# Patient Record
Sex: Female | Born: 1970 | Race: Black or African American | Hispanic: No | Marital: Married | Smoking: Never smoker
Health system: Southern US, Community
[De-identification: ages and names within clinical notes are randomized; demographics above are authoritative.]

## PROBLEM LIST (undated history)

## (undated) DIAGNOSIS — M940 Chondrocostal junction syndrome [Tietze]: Secondary | ICD-10-CM

## (undated) DIAGNOSIS — G43909 Migraine, unspecified, not intractable, without status migrainosus: Secondary | ICD-10-CM

## (undated) DIAGNOSIS — E7800 Pure hypercholesterolemia, unspecified: Secondary | ICD-10-CM

## (undated) DIAGNOSIS — E0590 Thyrotoxicosis, unspecified without thyrotoxic crisis or storm: Secondary | ICD-10-CM

## (undated) DIAGNOSIS — K219 Gastro-esophageal reflux disease without esophagitis: Secondary | ICD-10-CM

## (undated) HISTORY — DX: Migraine, unspecified, not intractable, without status migrainosus: G43.909

## (undated) HISTORY — DX: Gastro-esophageal reflux disease without esophagitis: K21.9

## (undated) HISTORY — DX: Chondrocostal junction syndrome (tietze): M94.0

---

## 2012-08-03 HISTORY — PX: COLONOSCOPY: SHX174

## 2013-04-26 DIAGNOSIS — E559 Vitamin D deficiency, unspecified: Secondary | ICD-10-CM | POA: Insufficient documentation

## 2013-12-26 ENCOUNTER — Encounter: Payer: Self-pay | Admitting: Endocrinology

## 2013-12-26 ENCOUNTER — Ambulatory Visit (INDEPENDENT_AMBULATORY_CARE_PROVIDER_SITE_OTHER): Payer: BC Managed Care – PPO | Admitting: Endocrinology

## 2013-12-26 VITALS — BP 122/84 | HR 81 | Temp 98.6°F | Wt 157.0 lb

## 2013-12-26 DIAGNOSIS — E0590 Thyrotoxicosis, unspecified without thyrotoxic crisis or storm: Secondary | ICD-10-CM

## 2013-12-26 LAB — TSH: TSH: 7.34 u[IU]/mL — ABNORMAL HIGH (ref 0.35–4.50)

## 2013-12-26 LAB — T4, FREE: Free T4: 0.69 ng/dL (ref 0.60–1.60)

## 2013-12-26 NOTE — Patient Instructions (Addendum)
blood tests are being requested for you today.  We'll let you know about the results. if ever you have fever while taking methimazole, stop it and call us, because of the risk of a rare side-effect If you want, you can change your mind and do the radioactive iodine treatment in the future. Let's plan to recheck the ultrasound in 2016 or 2017.

## 2013-12-26 NOTE — Progress Notes (Signed)
Subjective:    Patient ID: Christina [name]. Christina [name], female    DOB: 1970/04/15, 43 y.o.   MRN: [id number]  HPI Pt reports she was dx'ed with hyperthyroidism in early 2015, in [place].  He has never had XRT to the anterior neck, or thyroid surgery.  she does not consume kelp or any non-prescribed thyroid medication.  He has never been on amiodarone.  Pt is unaware why tapazole was chosen as rx, but she takes it as rx'ed.  She had moderate palpitations in the chest, and assoc sob.  However, sxs are much better now.   No past medical history on file.  No past surgical history on file.  History   Social History  . Marital Status: Single    Spouse Name: N/A    Number of Children: N/A  . Years of Education: N/A   Occupational History  . Not on file.   Social History Main Topics  . Smoking status: Never Smoker   . Smokeless tobacco: Not on file  . Alcohol Use: No  . Drug Use: Not on file  . Sexual Activity: Not on file   Other Topics Concern  . Not on file   Social History Narrative  . No narrative on file    No current outpatient prescriptions on file prior to visit.   No current facility-administered medications on file prior to visit.    Not on File  Family History  Problem Relation Age of Onset  . Thyroid disease Neg Hx     BP 122/84 mmHg  Pulse 81  Temp(Src) 98.6 F (37 C) (Oral)  Wt 157 lb (71.215 kg)  SpO2 97%  LMP 12/10/2013     Review of Systems denies weight loss, headache, hoarseness, double vision, edema, diarrhea, polyuria, muscle weakness, excessive diaphoresis, numbness, tremor, anxiety, easy bruising, and rhinorrhea.  She has infrequent menses.    Objective:   Physical Exam VS: see vs page GEN: no distress HEAD: head: no deformity eyes: no periorbital swelling, no proptosis external nose and ears are normal mouth: no lesion seen NECK: thyroid is slightly and diffusely enlarged CHEST WALL: no deformity LUNGS:  Clear to auscultation CV: reg rate  and rhythm, no murmur ABD: abdomen is soft, nontender.  no hepatosplenomegaly.  not distended.  no hernia MUSCULOSKELETAL: muscle bulk and strength are grossly normal.  no obvious joint swelling.  gait is normal and steady EXTEMITIES: no deformity.  no edema PULSES: no carotid bruit NEURO:  cn 2-12 grossly intact.   readily moves all 4's.  sensation is intact to touch on all 4's.  No tremor SKIN:  Normal texture and temperature.  No rash or suspicious lesion is visible.  Not diaphoretic.  NODES:  None palpable at the neck PSYCH: alert, well-oriented.  Does not appear anxious nor depressed.    i have reviewed the following old records: Office notes: pt was eval for hyperthyroidism, and rx'ed with tapazole  Radiol: nuc med scan: diffuse uptake (45% at 24 hrs). Thyroid US diffuse goiter, with multiple very small nodules  Lab Results  Component Value Date   TSH 7.34* 12/26/2013       Assessment & Plan:  Hyperthyroidism, new to me: slightly overcontrolled: we discussed rx options.  Pt chooses to continue the tapazole for now.  i advised her to reduce to TIW.  Multinodular goiter, very small   Patient is advised the following: Patient Instructions  blood tests are being requested for you today.  We'll let you know  about the results. if ever you have fever while taking methimazole, stop it and call us, because of the risk of a rare side-effect If you want, you can change your mind and do the radioactive iodine treatment in the future. Let's plan to recheck the ultrasound in 2016 or 2017.

## 2013-12-27 ENCOUNTER — Telehealth: Payer: Self-pay | Admitting: Endocrinology

## 2013-12-27 NOTE — Telephone Encounter (Signed)
please call patient: We need to reduce your thyroid pill.  Please take 1 pill, just mon, wed, fri. Please come back for a follow-up appointment in 2-3 months.

## 2013-12-28 NOTE — Telephone Encounter (Signed)
Pt advised of note below and voiced understanding.  

## 2013-12-28 NOTE — Telephone Encounter (Signed)
Requested call back to discuss.  

## 2014-03-10 ENCOUNTER — Emergency Department (HOSPITAL_COMMUNITY): Payer: Federal, State, Local not specified - PPO

## 2014-03-10 ENCOUNTER — Emergency Department (HOSPITAL_COMMUNITY)
Admission: EM | Admit: 2014-03-10 | Discharge: 2014-03-11 | Disposition: A | Discharge status: Home / Self Care | Payer: Federal, State, Local not specified - PPO | Attending: Emergency Medicine | Admitting: Emergency Medicine

## 2014-03-10 ENCOUNTER — Encounter (HOSPITAL_COMMUNITY): Payer: Self-pay | Admitting: Emergency Medicine

## 2014-03-10 DIAGNOSIS — R531 Weakness: Secondary | ICD-10-CM | POA: Diagnosis not present

## 2014-03-10 DIAGNOSIS — R51 Headache: Secondary | ICD-10-CM | POA: Diagnosis not present

## 2014-03-10 DIAGNOSIS — H539 Unspecified visual disturbance: Secondary | ICD-10-CM | POA: Diagnosis not present

## 2014-03-10 DIAGNOSIS — R519 Headache, unspecified: Secondary | ICD-10-CM

## 2014-03-10 HISTORY — DX: Thyrotoxicosis, unspecified without thyrotoxic crisis or storm: E05.90

## 2014-03-10 LAB — I-STAT CREATININE, ED: CREATININE: 1.1 mg/dL (ref 0.50–1.10)

## 2014-03-10 LAB — CBC WITH DIFFERENTIAL/PLATELET
Basophils Absolute: 0 10*3/uL (ref 0.0–0.1)
Basophils Relative: 0 % (ref 0–1)
Eosinophils Absolute: 0 10*3/uL (ref 0.0–0.7)
Eosinophils Relative: 0 % (ref 0–5)
HCT: 40.8 % (ref 36.0–46.0)
Hemoglobin: 13.5 g/dL (ref 12.0–15.0)
LYMPHS PCT: 22 % (ref 12–46)
Lymphs Abs: 1.3 10*3/uL (ref 0.7–4.0)
MCH: 28.7 pg (ref 26.0–34.0)
MCHC: 33.1 g/dL (ref 30.0–36.0)
MCV: 86.6 fL (ref 78.0–100.0)
Monocytes Absolute: 0.5 10*3/uL (ref 0.1–1.0)
Monocytes Relative: 9 % (ref 3–12)
NEUTROS ABS: 3.9 10*3/uL (ref 1.7–7.7)
Neutrophils Relative %: 69 % (ref 43–77)
PLATELETS: 222 10*3/uL (ref 150–400)
RBC: 4.71 MIL/uL (ref 3.87–5.11)
RDW: 12.6 % (ref 11.5–15.5)
WBC: 5.7 10*3/uL (ref 4.0–10.5)

## 2014-03-10 LAB — BASIC METABOLIC PANEL
Anion gap: 2 — ABNORMAL LOW (ref 5–15)
BUN: 11 mg/dL (ref 6–23)
CO2: 26 mmol/L (ref 19–32)
CREATININE: 1.01 mg/dL (ref 0.50–1.10)
Calcium: 8.6 mg/dL (ref 8.4–10.5)
Chloride: 108 mmol/L (ref 96–112)
GFR calc non Af Amer: 67 mL/min — ABNORMAL LOW (ref >90)
GFR, EST AFRICAN AMERICAN: 77 mL/min — AB (ref >90)
Glucose, Bld: 107 mg/dL — ABNORMAL HIGH (ref 70–99)
POTASSIUM: 3.8 mmol/L (ref 3.5–5.1)
SODIUM: 136 mmol/L (ref 135–145)

## 2014-03-10 MED ORDER — METOCLOPRAMIDE HCL 5 MG/ML IJ SOLN
10.0000 mg | Freq: Once | INTRAMUSCULAR | Status: AC
  Administered 2014-03-10: 10 mg via INTRAVENOUS
  Filled 2014-03-10: qty 2

## 2014-03-10 MED ORDER — DIPHENHYDRAMINE HCL 50 MG/ML IJ SOLN
25.0000 mg | Freq: Once | INTRAMUSCULAR | Status: AC
  Administered 2014-03-10: 25 mg via INTRAVENOUS
  Filled 2014-03-10: qty 1

## 2014-03-10 MED ORDER — IOHEXOL 350 MG/ML SOLN
100.0000 mL | Freq: Once | INTRAVENOUS | Status: AC | PRN
  Administered 2014-03-10: 100 mL via INTRAVENOUS

## 2014-03-10 MED ORDER — KETOROLAC TROMETHAMINE 30 MG/ML IJ SOLN
30.0000 mg | Freq: Once | INTRAMUSCULAR | Status: AC
  Administered 2014-03-10: 30 mg via INTRAVENOUS
  Filled 2014-03-10: qty 1

## 2014-03-10 NOTE — ED Notes (Signed)
Pt states today around 145 pm she started having problems with her vision where her vision in her left eye went blurred then she states she could not see at all from that eye  Pt states she had weakness in her right arm  Pt states she could raise her arm but her coordination was not good in it    Pt states those symptoms have resolved but now she is c/o headache on the left side that goes down into her neck  Pt states she was in [place] at the time and went to a local treatment center but the wait was 2 hours and so she came back home and then came here  Pt has no neuro deficits noted upon arrival

## 2014-03-10 NOTE — ED Notes (Signed)
Per Dr. Jeneen Rinks, patient to be transferred to [facility] Patient and family members are aware of plan of care--agree and v/u

## 2014-03-10 NOTE — ED Notes (Signed)
CT results noted Patient remains alert and oriented x 4 Patient denies c/o pain, return of vision loss, upper extremity pain and numbness Cardiac monitor with cycling BP continues  Patient in NAD

## 2014-03-10 NOTE — ED Notes (Signed)
Patient ambulatory from triage Patient states that at 1330 she experienced left eye "blindness" Patient states that at 1730 she could "see better out of the left eye, but it was blotches" Patient states that at 1330 she began to experience right hand and arm numbness which is still present Patient then reports that she started to have a headache at 1600 and the pain radiated to her left jaw Patient with c/o headache which she rates 5/10 Patient arrives alert and oriented x 4

## 2014-03-10 NOTE — ED Notes (Signed)
Patient back from CT scan Patient remains in NAD

## 2014-03-10 NOTE — ED Notes (Signed)
Per Dr. Jeneen Rinks, patient to be transferred to ED for MRI [facility] ED Charge Nurse called and made aware of patient transfer

## 2014-03-10 NOTE — ED Notes (Signed)
Neuro assessment completed Neuro WNL

## 2014-03-10 NOTE — ED Provider Notes (Addendum)
CSN: [id number]     Arrival date & time 03/10/14  2035 History   First MD Initiated Contact with Patient 03/10/14 2045     Chief Complaint  Patient presents with  . Headache      HPI  Patient presents for evaluation of headache, vision changes, and right arm symptoms.  She was visiting her family in [place]. She was this morning. Proximal 1 PM she was cleaning a toilet. She leaned over. When she stood up she states that she noticed the vision in her left eye seems poor. She has difficulty describing if this was one side of her visual field that she states she "thinks" that was her "entire left eye". Over the next half an hour she states her right arm felt clumsy and she had difficulty lifting it. Oxalate 4:00 they had gone to a local healthcare facility that had a long wait. Her family decided to drive her back to [place] where she lives. En route her symptoms have all resolved. However she developed a mild/moderate left-sided throbbing headache. Left frontal periorbital to the mandible into the left anterior neck. No fever. No falls or injuries or trauma. No past similar episodes.  Denies illicit drug use. No history of smoking. No history of cardiovascular disease. States occasionally will feel as "skipped beat" but no persistence of tachycardia palpitations and no symptoms recently. This is infrequently experience. No history of hypertension diabetes. She had an aunt with a stroke. No known family history of aneurysms or subarachnoid hemorrhages. She has never had a headache that she considered a migraine.  Past Medical History  Diagnosis Date  . Hyperthyroidism    History reviewed. No pertinent past surgical history. Family History  Problem Relation Age of Onset  . Thyroid disease Neg Hx   . Diabetes Mother   . Hypertension Mother    History  Substance Use Topics  . Smoking status: Never Smoker   . Smokeless tobacco: Not on file  . Alcohol Use: No   OB History    No data  available     Review of Systems  Constitutional: Negative for fever, chills, diaphoresis, appetite change and fatigue.  HENT: Negative for mouth sores, sore throat and trouble swallowing.   Eyes: Positive for visual disturbance.  Respiratory: Negative for cough, chest tightness, shortness of breath and wheezing.   Cardiovascular: Negative for chest pain.  Gastrointestinal: Negative for nausea, vomiting, abdominal pain, diarrhea and abdominal distention.  Endocrine: Negative for polydipsia, polyphagia and polyuria.  Genitourinary: Negative for dysuria, frequency and hematuria.  Musculoskeletal: Negative for gait problem.  Skin: Negative for color change, pallor and rash.  Neurological: Positive for weakness and headaches. Negative for dizziness, syncope and light-headedness.  Hematological: Does not bruise/bleed easily.  Psychiatric/Behavioral: Negative for behavioral problems and confusion.      Allergies  Review of patient's allergies indicates no known allergies.  Home Medications   Prior to Admission medications   Medication Sig Start Date End Date Taking? Authorizing Provider  Biotin w/ Vitamins C & E 1250-7.5-7.5 MCG-MG-UNT CHEW Chew 2 tablets by mouth daily.   Yes Historical Provider, MD  cholecalciferol (VITAMIN D) 1000 UNITS tablet Take 1,000 Units by mouth daily.   Yes Historical Provider, MD  ibuprofen (ADVIL,MOTRIN) 200 MG tablet Take 400 mg by mouth every 6 (six) hours as needed for moderate pain.   Yes Historical Provider, MD  methimazole (TAPAZOLE) 5 MG tablet Take 5 mg by mouth every Monday, Wednesday, and Friday.    Yes  Historical Provider, MD   BP 108/76 mmHg  Pulse 73  Temp(Src) 98.1 F (36.7 C) (Oral)  Resp 14  Ht 5\' 7"  (1.702 m)  Wt 150 lb (68.04 kg)  BMI 23.49 kg/m2  SpO2 97%  LMP 03/03/2014 (Exact Date) Physical Exam  Constitutional: She is oriented to person, place, and time. She appears well-developed and well-nourished. No distress.  HENT:  Head:  Normocephalic.  Eyes: Conjunctivae are normal. Pupils are equal, round, and reactive to light. No scleral icterus.  Neck: Normal range of motion. Neck supple. No thyromegaly present.  Cardiovascular: Normal rate and regular rhythm.  Exam reveals no gallop and no friction rub.   No murmur heard. Pulmonary/Chest: Effort normal and breath sounds normal. No respiratory distress. She has no wheezes. She has no rales.  Abdominal: Soft. Bowel sounds are normal. She exhibits no distension. There is no tenderness. There is no rebound.  Musculoskeletal: Normal range of motion.  Neurological: She is alert and oriented to person, place, and time.  Cranial nerves intact including visual fields to confrontation. No pronator drift. Normal strength and sensation all distributions of the 4 extremities. Normal cerebellar function. Normal DTRs.  Skin: Skin is warm and dry. No rash noted.  Psychiatric: She has a normal mood and affect. Her behavior is normal.    ED Course  Procedures (including critical care time) Labs Review Labs Reviewed  BASIC METABOLIC PANEL - Abnormal; Notable for the following:    Glucose, Bld 107 (*)    GFR calc non Af Amer 67 (*)    GFR calc Af Amer 77 (*)    Anion gap 2 (*)    All other components within normal limits  CBC WITH DIFFERENTIAL/PLATELET  I-STAT CREATININE, ED    Imaging Review No results found.   EKG Interpretation   Date/Time:  Sunday March 10 2014 20:49:52 EST Ventricular Rate:  80 PR Interval:  149 QRS Duration: 79 QT Interval:  380 QTC Calculation: 438 R Axis:   58 Text Interpretation:  Sinus rhythm Baseline wander in lead(s) V5 V6 ED  PHYSICIAN INTERPRETATION AVAILABLE IN [facility] Confirmed by TEST,  Record ([id number]) on 03/12/2014 6:51:39 AM      MDM   Final diagnoses:  Headache    Patient is no risk for subarachnoid hemorrhage. Her with the pain except into her neck and requested a CT head, and cervical/ intracranial  angiogram.   Discussed with Dr. Doy Mince of neurology. She felt that with patient's symptoms she will need MRI to completely rule out CVA. Did not think that the abdomen noted on angiogram was etiology for her symptoms. I discussed with Dr.Rees, the partner at [facility]. Patient will be transferred for MRI. Tanna Furry, MD 03/10/14 2223  Tanna Furry, MD 03/10/14 9528  Tanna Furry, MD 03/13/14 [phone]

## 2014-03-10 NOTE — ED Notes (Signed)
Patient states that she does not want [facility] called until her husband comes back to ED from the gas station  Will inform EDP and ED Charge

## 2014-03-10 NOTE — ED Notes (Signed)
Patient now reports that vision is normal Peripheral vision WNL PERRLA

## 2014-03-11 ENCOUNTER — Emergency Department (HOSPITAL_COMMUNITY): Payer: Federal, State, Local not specified - PPO

## 2014-03-11 MED ORDER — ONDANSETRON HCL 4 MG/2ML IJ SOLN
4.0000 mg | Freq: Once | INTRAMUSCULAR | Status: AC
  Administered 2014-03-11: 4 mg via INTRAVENOUS
  Filled 2014-03-11: qty 2

## 2014-03-11 MED ORDER — LORAZEPAM 2 MG/ML IJ SOLN
1.0000 mg | Freq: Once | INTRAMUSCULAR | Status: AC
  Administered 2014-03-11: 1 mg via INTRAVENOUS
  Filled 2014-03-11: qty 1

## 2014-03-11 MED ORDER — MORPHINE SULFATE 4 MG/ML IJ SOLN
4.0000 mg | INTRAMUSCULAR | Status: DC | PRN
  Administered 2014-03-11: 4 mg via INTRAVENOUS
  Filled 2014-03-11: qty 1

## 2014-03-11 NOTE — ED Notes (Signed)
Patient informed that her husband and son will not be able to ride in ambulance over to [facility] ED and that they will have to drive themselves [facility] called and ETA approximately 30 minutes Patient informed

## 2014-03-11 NOTE — ED Notes (Signed)
Patient transferred to [facility] ED  Patient in NAD upon leaving ED

## 2014-03-11 NOTE — ED Notes (Signed)
Pt returned from MRI °

## 2014-03-11 NOTE — ED Notes (Signed)
Report given to Billy with [facility] 

## 2014-03-11 NOTE — Discharge Instructions (Signed)
Start taking an Aspirin daily (324 mg enteric coated).  Your MRI was normal.  Your CT of your neck shows narrowing of your carotid artery that needs to be followed up by your family doctor.     General Headache Without Cause A headache is pain or discomfort felt around the head or neck area. The specific cause of a headache may not be found. There are many causes and types of headaches. A few common ones are:  Tension headaches.  Migraine headaches.  Cluster headaches.  Chronic daily headaches. HOME CARE INSTRUCTIONS   Keep all follow-up appointments with your caregiver or any specialist referral.  Only take over-the-counter or prescription medicines for pain or discomfort as directed by your caregiver.  Lie down in a dark, quiet room when you have a headache.  Keep a headache journal to find out what may trigger your migraine headaches. For example, write down:  What you eat and drink.  How much sleep you get.  Any change to your diet or medicines.  Try massage or other relaxation techniques.  Put ice packs or heat on the head and neck. Use these 3 to 4 times per day for 15 to 20 minutes each time, or as needed.  Limit stress.  Sit up straight, and do not tense your muscles.  Quit smoking if you smoke.  Limit alcohol use.  Decrease the amount of caffeine you drink, or stop drinking caffeine.  Eat and sleep on a regular schedule.  Get 7 to 9 hours of sleep, or as recommended by your caregiver.  Keep lights dim if bright lights bother you and make your headaches worse. SEEK MEDICAL CARE IF:   You have problems with the medicines you were prescribed.  Your medicines are not working.  You have a change from the usual headache.  You have nausea or vomiting. SEEK IMMEDIATE MEDICAL CARE IF:   Your headache becomes severe.  You have a fever.  You have a stiff neck.  You have loss of vision.  You have muscular weakness or loss of muscle control.  You start  losing your balance or have trouble walking.  You feel faint or pass out.  You have severe symptoms that are different from your first symptoms. MAKE SURE YOU:   Understand these instructions.  Will watch your condition.  Will get help right away if you are not doing well or get worse. Document Released: 01/11/2005 Document Revised: 04/05/2011 Document Reviewed: 01/27/2011 [facility] Patient Information 2015 [place], [place]. This information is not intended to replace advice given to you by your health care provider. Make sure you discuss any questions you have with your health care provider.

## 2014-03-11 NOTE — ED Notes (Signed)
[facility] present in ED

## 2014-03-11 NOTE — ED Notes (Signed)
Report received by [facility]- care resumed by this RN. nad upon arrival. Pt denies pain at this time.

## 2014-03-11 NOTE — ED Notes (Signed)
Neuro assessment completed prior to transfer to [facility] ED

## 2014-03-11 NOTE — ED Notes (Signed)
Patient now with c/o headache returning Will make Dr. Jeneen Rinks aware

## 2014-03-11 NOTE — ED Provider Notes (Signed)
Patient visit shared. Patient transferred from [facility] for evaluation of headache with MRI to rule out stroke.   On evaluation in the emergency department patient is neurologically intact and states her headache is resolved. MRI has been reviewed and the report is with no evidence of acute stroke. Plan to DC home with outpatient follow-up.  Quintella Reichert, MD 03/11/14 [phone]

## 2014-03-11 NOTE — ED Notes (Signed)
Pt transported to MRI 

## 2014-03-18 ENCOUNTER — Emergency Department (HOSPITAL_COMMUNITY)
Admission: EM | Admit: 2014-03-18 | Discharge: 2014-03-18 | Disposition: A | Discharge status: Home / Self Care | Payer: Federal, State, Local not specified - PPO | Source: Home / Self Care | Attending: Emergency Medicine | Admitting: Emergency Medicine

## 2014-03-18 ENCOUNTER — Encounter (HOSPITAL_COMMUNITY): Payer: Self-pay

## 2014-03-18 DIAGNOSIS — G43101 Migraine with aura, not intractable, with status migrainosus: Secondary | ICD-10-CM

## 2014-03-18 MED ORDER — SUMATRIPTAN SUCCINATE 6 MG/0.5ML ~~LOC~~ SOLN
SUBCUTANEOUS | Status: AC
  Filled 2014-03-18: qty 0.5

## 2014-03-18 MED ORDER — SUMATRIPTAN SUCCINATE 6 MG/0.5ML ~~LOC~~ SOLN
6.0000 mg | Freq: Once | SUBCUTANEOUS | Status: AC
  Administered 2014-03-18: 6 mg via SUBCUTANEOUS

## 2014-03-18 MED ORDER — SUMATRIPTAN SUCCINATE 100 MG PO TABS: ORAL_TABLET | ORAL | Status: DC

## 2014-03-18 NOTE — ED Notes (Signed)
Patient c/o HA since 2-14

## 2014-03-18 NOTE — ED Provider Notes (Signed)
CSN: [id number]     Arrival date & time 03/18/14  1125 History   First MD Initiated Contact with Patient 03/18/14 1327     Chief Complaint  Patient presents with  . Headache   (Consider location/radiation/quality/duration/timing/severity/associated sxs/prior Treatment) HPI Comments: Patient developed headache with associated visual disturbance and right arm weakness began on 03/10/2014. Seen and treated at both [facility] and [facility]. States headache has continued to wax and wane along with associated left visual field scotoma and aura. Has tried taking ibuprofen and Goody's headache powders with some relief. Is concerned that headache has not resolved completely.  Works for [employer] NO recent head trauma, fever or neck pain. Mild associated photophobia, but no nausea/vomiting.   Head CT 03/10/2014: IMPRESSION: Normal CT of the head with and without contrast.  Normal CTA of the head ; no intracranial aneurysm.  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 Findings suggest remote dissection, less likely fibromuscular dysplasia. Recommend followup.  Considering symptoms, MRI of the brain with diffusion-weighted sequences may be indicated to evaluate for stroke.   Brain MRI 03/11/2014:  MRI HEAD WITHOUT CONTRAST  TECHNIQUE: Multiplanar, multiecho pulse sequences of the brain and surrounding structures were obtained without intravenous contrast.  COMPARISON: CT angiogram of the head March 10, 2014  FINDINGS: The ventricles and sulci are normal. No abnormal parenchymal signal, mass lesions or mass of affect. No reduced diffusion to suggest acute ischemia. No susceptibility artifact to suggest hemorrhage.  No abnormal extra-axial fluid collection. Normal major intracranial vascular flow voids seen at the skull base. However, luminal irregularity of the  LEFT cervical internal carotid artery flow voids corresponding to known CT abnormality.  No abnormal calvarial bone marrow signal. Ocular globes and orbital contents are unremarkable though not tailored for evaluation. LEFT maxillary mucosal retention cysts without paranasal sinus air-fluid levels. The mastoid air cells are well aerated. No abnormal sellar expansion. Craniocervical junction is maintained.  IMPRESSION: Normal MRI of the brain without contrast.    Patient is a 44 y.o. female presenting with headaches. The history is provided by the patient.  Headache Pain location:  L temporal Quality:  Unable to specify Chronicity:  New Associated symptoms: photophobia   Associated symptoms: no back pain, no congestion, no dizziness, no drainage, no ear pain, no eye pain, no fever, no hearing loss, no neck pain, no neck stiffness, no numbness, no seizures, no sinus pressure, no sore throat and no weakness     Past Medical History  Diagnosis Date  . Hyperthyroidism    History reviewed. No pertinent past surgical history. Family History  Problem Relation Age of Onset  . Thyroid disease Neg Hx   . Diabetes Mother   . Hypertension Mother    History  Substance Use Topics  . Smoking status: Never Smoker   . Smokeless tobacco: Not on file  . Alcohol Use: No   OB History    No data available     Review of Systems  Constitutional: Negative for fever.  HENT: Negative for congestion, ear discharge, ear pain, facial swelling, hearing loss, mouth sores, nosebleeds, postnasal drip, rhinorrhea, sinus pressure, sore throat and tinnitus.   Eyes: Positive for photophobia and visual disturbance. Negative for pain.  Respiratory: Negative.   Cardiovascular: Negative.   Gastrointestinal: Negative.   Musculoskeletal: Negative for back pain, neck pain and neck stiffness.  Skin: Negative.   Neurological: Positive for headaches.  Negative for dizziness, seizures, syncope, weakness,  light-headedness and numbness.  Psychiatric/Behavioral: Negative.     Allergies  Review of patient's allergies indicates no known allergies.  Home Medications   Prior to Admission medications   Medication Sig Start Date End Date Taking? Authorizing Provider  Biotin w/ Vitamins C & E 1250-7.5-7.5 MCG-MG-UNT CHEW Chew 2 tablets by mouth daily.    Historical Provider, MD  cholecalciferol (VITAMIN D) 1000 UNITS tablet Take 1,000 Units by mouth daily.    Historical Provider, MD  ibuprofen (ADVIL,MOTRIN) 200 MG tablet Take 400 mg by mouth every 6 (six) hours as needed for moderate pain.    Historical Provider, MD  methimazole (TAPAZOLE) 5 MG tablet Take 5 mg by mouth every Monday, Wednesday, and Friday.     Historical Provider, MD  SUMAtriptan (IMITREX) 100 MG tablet Take one dose at onset of headache. May repeat in 2 hours if headache persists or recurs. 03/18/14   Annett Gula H Jovin Fester, PA   BP 122/88 mmHg  Pulse 72  Temp(Src) 98.1 F (36.7 C) (Oral)  Resp 20  SpO2 99%  LMP 03/03/2014 (Exact Date) Physical Exam  Constitutional: She is oriented to person, place, and time. She appears well-developed and well-nourished. No distress.  HENT:  Head: Normocephalic and atraumatic.  Right Ear: Hearing, tympanic membrane, external ear and ear canal normal.  Left Ear: Hearing, tympanic membrane, external ear and ear canal normal.  Nose: Nose normal.  Mouth/Throat: Uvula is midline, oropharynx is clear and moist and mucous membranes are normal.  Eyes: Conjunctivae and EOM are normal. Pupils are equal, round, and reactive to light. Right eye exhibits no discharge. Left eye exhibits no discharge. No scleral icterus.  Neck: Normal range of motion and full passive range of motion without pain. Neck supple. Normal range of motion present.  Cardiovascular: Normal rate, regular rhythm and normal heart sounds.   Pulmonary/Chest: Effort normal and breath sounds normal. No stridor.  Abdominal: Soft. Bowel  sounds are normal. She exhibits no distension. There is no tenderness.  Musculoskeletal: Normal range of motion.  Lymphadenopathy:    She has no cervical adenopathy.  Neurological: She is alert and oriented to person, place, and time. She has normal strength. No cranial nerve deficit or sensory deficit. She displays a negative Romberg sign. Coordination and gait normal. GCS eye subscore is 4. GCS verbal subscore is 5. GCS motor subscore is 6.  Skin: Skin is warm and dry. No rash noted. No erythema.  Psychiatric: She has a normal mood and affect. Her behavior is normal.  Nursing note and vitals reviewed.   ED Course  Procedures (including critical care time) Labs Review Labs Reviewed - No data to display  Imaging Review No results found.   MDM   1. Migraine with aura and with status migrainosus, not intractable   Exam without focal neurological deficit. Patient given 6mg  SQ injection of Imitrex while at [facility] and allowed to remain in clinic for observation for 35 minutes following injection. Upon re-evaluation, patient states headache has improved significantly and that she currently feels the best she has felt since headache began on 03/10/2014. Is enthusiastic about her improvement.  Will discharge home with Rx for Imitrex and sent ambulatory referrals to both [facility] and [facility] neurology for follow up. Explain reasons to seek urgent re-evaluation to both patient and husband and they voice understanding.   Lutricia Feil, [place] 03/18/14 1620

## 2014-03-18 NOTE — Discharge Instructions (Signed)

## 2014-04-02 ENCOUNTER — Telehealth: Payer: Self-pay | Admitting: Family

## 2014-04-02 ENCOUNTER — Encounter: Payer: Self-pay | Admitting: Endocrinology

## 2014-04-02 ENCOUNTER — Ambulatory Visit (INDEPENDENT_AMBULATORY_CARE_PROVIDER_SITE_OTHER): Payer: Federal, State, Local not specified - PPO | Admitting: Endocrinology

## 2014-04-02 VITALS — BP 117/73 | HR 71 | Temp 98.3°F | Wt 158.4 lb

## 2014-04-02 DIAGNOSIS — E0590 Thyrotoxicosis, unspecified without thyrotoxic crisis or storm: Secondary | ICD-10-CM

## 2014-04-02 LAB — TSH: TSH: 3.69 u[IU]/mL (ref 0.35–4.50)

## 2014-04-02 LAB — T4, FREE: Free T4: 0.76 ng/dL (ref 0.60–1.60)

## 2014-04-02 NOTE — Telephone Encounter (Signed)
PLEASE NOTE: All timestamps contained within this report are represented as [place] Standard Time. CONFIDENTIALTY NOTICE: This fax transmission is intended only for the addressee. It contains information that is legally privileged, confidential or otherwise protected from use or disclosure. If you are not the intended recipient, you are strictly prohibited from reviewing, disclosing, copying using or disseminating any of this information or taking any action in reliance on or regarding this information. If you have received this fax in error, please notify us immediately by telephone so that we can arrange for its return to [place]. Phone: [phone], Toll-Free: [phone], Fax: [phone] Page: 1 of 1 Call Id: [id number] [facility] [place] Patient Name: Christina [name] DOB: 12/03/1970 Initial Comment Caller States she i having issues with her ora everyday. wants to know if she should be seen before her appt. on 4/4 @ 1:30pm Nurse Assessment Nurse: Ronnald Ramp, RN, Miranda Date/Time (Eastern Time): 04/02/2014 2:22:35 PM Confirm and document reason for call. If symptomatic, describe symptoms. 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 Had trouble with right side of body and visual changes in her left eye. Continue with headache and seen in UC and given Imitrex. She has still had vision changes in her left eye. She has spot in her vision and headache. Aleve helps with headaches. Has the patient traveled out of the country within the last 30 days? ---Not Applicable Does the patient require triage? ---Yes Related visit to physician within the last 2 weeks? ---Yes Does the PT have any chronic conditions? (i.e. diabetes, asthma, etc.) ---Yes List chronic conditions. ---Thyroid, Did the patient indicate they were pregnant? ---No Guidelines Guideline Title Affirmed Question Affirmed Notes Vision Loss or  Change [1] Blurred vision or visual changes AND [2] gradual onset (e.g., weeks, months) Final Disposition User See PCP within 2 Ronny Flurry, RN, Miranda Comments Caller states missed nurse's callback; xferred to [place]; Appt scheduled for 3/9 at 8:30am with Dr. Terri Piedra.

## 2014-04-02 NOTE — Progress Notes (Signed)
   Subjective:    Patient ID: Christina [name]. [name], female    DOB: 04-24-1970, 44 y.o.   MRN: [id number]  HPI Pt returns for f/u of hyperthyroidism (dx'ed in early 2015, in [place]; nuc med scan showed diffuse uptake (45% at 24 hrs); US showed diffuse goiter, with multiple very small nodules; pt is unaware why tapazole was chosen as rx, but she wishes to continue). she takes tapazole as rx'ed.  Since on the reduced tapazole, pt states she feels well in general, except for a migraine headache.    Past Medical History  Diagnosis Date  . Hyperthyroidism   . Migraines     No past surgical history on file.  History   Social History  . Marital Status: Single    Spouse Name: N/A  . Number of Children: 1  . Years of Education: 14   Occupational History  . Mail Processor    Social History Main Topics  . Smoking status: Never Smoker   . Smokeless tobacco: Never Used  . Alcohol Use: No  . Drug Use: No  . Sexual Activity: Not on file   Other Topics Concern  . Not on file   Social History Narrative   Born and raised in [place]. Currently resides in a house with her child. No pets. Fun: Go to the movies.    Denies religious beliefs effecting health care.     Current Outpatient Prescriptions on File Prior to Visit  Medication Sig Dispense Refill  . Biotin w/ Vitamins C & E 1250-7.5-7.5 MCG-MG-UNT CHEW Chew 2 tablets by mouth daily.    . cholecalciferol (VITAMIN D) 1000 UNITS tablet Take 1,000 Units by mouth daily.    . methimazole (TAPAZOLE) 5 MG tablet Take 5 mg by mouth every Monday, Wednesday, and Friday.      No current facility-administered medications on file prior to visit.    No Known Allergies  Family History  Problem Relation Age of Onset  . Thyroid disease Neg Hx   . Diabetes Mother   . Hypertension Mother   . Colon cancer Mother   . Breast cancer Mother   . Prostate cancer Father     BP 117/73 mmHg  Pulse 71  Temp(Src) 98.3 F (36.8 C) (Oral)  Wt 158 lb 6.4 oz  (71.85 kg)  LMP 03/03/2014 (Exact Date)  Review of Systems She denies fever.      Objective:   Physical Exam VITAL SIGNS:  See vs page GENERAL: no distress NECK: thyroid is slightly and diffusely enlarged.     Lab Results  Component Value Date   TSH 3.69 04/02/2014      Assessment & Plan:  Hyperthyroidism: well-controlled.   Patient is advised the following: Patient Instructions  blood tests are being requested for you today.  We'll let you know about the results. if ever you have fever while taking methimazole, stop it and call us, because of the risk of a rare side-effect If you want, you can change your mind and do the radioactive iodine treatment in the future. Please come back for a follow-up appointment in 4-5 months.   Let's plan to recheck the ultrasound in later this year, or next year.    addendum: Please continue the same tapazole

## 2014-04-02 NOTE — Patient Instructions (Signed)
blood tests are being requested for you today.  We'll let you know about the results. if ever you have fever while taking methimazole, stop it and call us, because of the risk of a rare side-effect If you want, you can change your mind and do the radioactive iodine treatment in the future. Please come back for a follow-up appointment in 4-5 months.   Let's plan to recheck the ultrasound in later this year, or next year.

## 2014-04-03 ENCOUNTER — Ambulatory Visit (INDEPENDENT_AMBULATORY_CARE_PROVIDER_SITE_OTHER): Payer: Federal, State, Local not specified - PPO | Admitting: Family

## 2014-04-03 ENCOUNTER — Telehealth: Payer: Self-pay | Admitting: *Deleted

## 2014-04-03 ENCOUNTER — Encounter: Payer: Self-pay | Admitting: Family

## 2014-04-03 VITALS — BP 112/80 | HR 78 | Temp 98.1°F | Resp 18 | Ht 67.0 in | Wt 158.1 lb

## 2014-04-03 DIAGNOSIS — Z0279 Encounter for issue of other medical certificate: Secondary | ICD-10-CM

## 2014-04-03 DIAGNOSIS — G43109 Migraine with aura, not intractable, without status migrainosus: Secondary | ICD-10-CM

## 2014-04-03 DIAGNOSIS — G43909 Migraine, unspecified, not intractable, without status migrainosus: Secondary | ICD-10-CM | POA: Insufficient documentation

## 2014-04-03 MED ORDER — NAPROXEN 500 MG PO TABS: 500.0000 mg | ORAL_TABLET | Freq: Two times a day (BID) | ORAL | Status: DC | PRN

## 2014-04-03 NOTE — Progress Notes (Signed)
   Subjective:    Patient ID: Christina [name], female    DOB: [birth year], 44 y.o.   MRN: [id number]  Chief Complaint  Patient presents with  . Establish Care    x3 weeks, has beeng having headaches and visual changes, says she feels like something is covering her eye at times where she can not see, after visual changes she starts having a headache above the right eye    HPI:  Christina [name] is a 44 y.o. female who presents today to establish care and discuss headaches.    1) Headaches - This is a chronic problem. Associated symptom of a headache has been going on for about 3 weeks. Has notes that she has had some relief this past week more than before. Headaches are described as throbbing with sensitivity to light and sound accompanied by nausea, but denies vomiting. Notes visual changes in her left eye. Notes the change in vision was worse following the injection of the Sumatriptan. Intensity of the headaches rates around a 6-7/10. Was recently seen in the ED for these headaches and was given an injection of Imitrex. Has previously treated with ibuprofen which has managed her headaches to this point. Denies the "worst headache of her life."    No Known Allergies   Current Outpatient Prescriptions on File Prior to Visit  Medication Sig Dispense Refill  . aspirin 81 MG tablet Take 81 mg by mouth daily.    . Biotin w/ Vitamins C & E 1250-7.5-7.5 MCG-MG-UNT CHEW Chew 2 tablets by mouth daily.    . cholecalciferol (VITAMIN D) 1000 UNITS tablet Take 1,000 Units by mouth daily.    . methimazole (TAPAZOLE) 5 MG tablet Take 5 mg by mouth every Monday, Wednesday, and Friday.      No current facility-administered medications on file prior to visit.    Past Medical History  Diagnosis Date  . Hyperthyroidism   . Migraines     History reviewed. No pertinent past surgical history.  Family History  Problem Relation Age of Onset  . Thyroid disease Neg Hx   . Diabetes Mother   . Hypertension  Mother   . Colon cancer Mother   . Breast cancer Mother   . Prostate cancer Father      Review of Systems  Eyes: Positive for photophobia and visual disturbance.  Neurological: Positive for headaches.      Objective:    BP 112/80 mmHg  Pulse 78  Temp(Src) 98.1 F (36.7 C) (Oral)  Resp 18  Ht 5\' 7"  (1.702 m)  Wt 158 lb 1.9 oz (71.723 kg)  BMI 24.76 kg/m2  SpO2 98%  LMP 03/03/2014 (Exact Date) Nursing note and vital signs reviewed.  Physical Exam  Constitutional: She is oriented to person, place, and time. She appears well-developed and well-nourished. No distress.  Eyes: Conjunctivae and EOM are normal. Pupils are equal, round, and reactive to light.  Neck: Neck supple.  Cardiovascular: Normal rate, regular rhythm, normal heart sounds and intact distal pulses.   Pulmonary/Chest: Effort normal and breath sounds normal.  Lymphadenopathy:    She has no cervical adenopathy.  Neurological: She is alert and oriented to person, place, and time. She has normal reflexes. No cranial nerve deficit. She exhibits normal muscle tone. Coordination normal.  Skin: Skin is warm and dry.  Psychiatric: She has a normal mood and affect. Her behavior is normal. Judgment and thought content normal.       Assessment & Plan:

## 2014-04-03 NOTE — Progress Notes (Signed)
Pre visit review using our clinic review tool, if applicable. No additional management support is needed unless otherwise documented below in the visit note. 

## 2014-04-03 NOTE — Telephone Encounter (Signed)
[place] [place] [facility] Patient Name: Christina [name] Gender: Female DOB: 1970/05/16 Age: 44 Y 85 M 21 D Return Phone Number: [phone] (Primary) Address: [address] [place] [address] Client [facility] - Client Client [facility] [place] - Day Physician Terri Piedra Contact Type Call Call Type Triage / Clinical Relationship To Patient Self Appointment Disposition EMR Appointment Scheduled Return Phone Number [phone] (Primary) Chief Complaint Unclassified Symptom Initial Comment Caller States she i having issues with her ora everyday. wants to know if she should be seen before her appt. on 4/4 @ 1:30pm PreDisposition Call Doctor Info pasted into Epic Yes Nurse Assessment Nurse: Ronnald Ramp, RN, Miranda Date/Time Eilene Ghazi Time): 04/02/2014 2:22:35 PM Confirm and document reason for call. If symptomatic, describe symptoms. 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 Had trouble with right side of body and visual changes in her left eye. Continue with headache and seen in UC and given Imitrex. She has still had vision changes in her left eye. She has spot in her vision and headache. Aleve helps with headaches. Has the patient traveled out of the country within the last 30 days? ---Not Applicable Does the patient require triage? ---Yes Related visit to physician within the last 2 weeks? ---Yes Does the PT have any chronic conditions? (i.e. diabetes, asthma, etc.) ---Yes List chronic conditions. ---Thyroid, Did the patient indicate they were pregnant? ---No Guidelines Guideline Title Affirmed Question Affirmed Notes Nurse Date/Time (Eastern Time) Vision Loss or Change [1] Blurred vision or visual changes AND [2] gradual onset (e.g., weeks, months) Ronnald Ramp, RN, Miranda 04/02/2014 2:26:07 PM Disp. Time Eilene Ghazi Time) Disposition Final  User 04/02/2014 2:16:38 PM Attempt made - message left Burney Gauze, Miranda 04/02/2014 2:29:01 PM See PCP within 2 Weeks Yes Ronnald Ramp, RN, [employer]

## 2014-04-03 NOTE — Assessment & Plan Note (Signed)
Symptoms and exam consistent with migraine headaches. Patient describes mild reaction to the Imitrex and would like to discontinue. Discontinue Imitrex. Indicates that her headaches are fairly well controlled with Aleve since she had switched. Start Naprosyn 500 mg twice a day when necessary. Refer to ophthalmologist for further evaluation of her left eye. She has a follow-up appointment with Dr. Tomi Likens of neurology for her headaches. Follow-up in one month or sooner if needed.

## 2014-04-03 NOTE — Patient Instructions (Signed)
Thank you for choosing [facility].  Summary/Instructions:  Please start taking the Naproxen as needed for headache.   Your prescription(s) have been submitted to your pharmacy or been printed and provided for you. Please take as directed and contact our office if you believe you are having problem(s) with the medication(s) or have any questions.  If your symptoms worsen or fail to improve, please contact our office for further instruction, or in case of emergency go directly to the emergency room at the closest medical facility.   Migraine Headache A migraine headache is an intense, throbbing pain on one or both sides of your head. A migraine can last for 30 minutes to several hours. CAUSES  The exact cause of a migraine headache is not always known. However, a migraine may be caused when nerves in the brain become irritated and release chemicals that cause inflammation. This causes pain. Certain things may also trigger migraines, such as:  Alcohol.  Smoking.  Stress.  Menstruation.  Aged cheeses.  Foods or drinks that contain nitrates, glutamate, aspartame, or tyramine.  Lack of sleep.  Chocolate.  Caffeine.  Hunger.  Physical exertion.  Fatigue.  Medicines used to treat chest pain (nitroglycerine), birth control pills, estrogen, and some blood pressure medicines. SIGNS AND SYMPTOMS  Pain on one or both sides of your head.  Pulsating or throbbing pain.  Severe pain that prevents daily activities.  Pain that is aggravated by any physical activity.  Nausea, vomiting, or both.  Dizziness.  Pain with exposure to bright lights, loud noises, or activity.  General sensitivity to bright lights, loud noises, or smells. Before you get a migraine, you may get warning signs that a migraine is coming (aura). An aura may include:  Seeing flashing lights.  Seeing bright spots, halos, or zigzag lines.  Having tunnel vision or blurred vision.  Having feelings  of numbness or tingling.  Having trouble talking.  Having muscle weakness. DIAGNOSIS  A migraine headache is often diagnosed based on:  Symptoms.  Physical exam.  A CT scan or MRI of your head. These imaging tests cannot diagnose migraines, but they can help rule out other causes of headaches. TREATMENT Medicines may be given for pain and nausea. Medicines can also be given to help prevent recurrent migraines.  HOME CARE INSTRUCTIONS  Only take over-the-counter or prescription medicines for pain or discomfort as directed by your health care provider. The use of long-term narcotics is not recommended.  Lie down in a dark, quiet room when you have a migraine.  Keep a journal to find out what may trigger your migraine headaches. For example, write down:  What you eat and drink.  How much sleep you get.  Any change to your diet or medicines.  Limit alcohol consumption.  Quit smoking if you smoke.  Get 7-9 hours of sleep, or as recommended by your health care provider.  Limit stress.  Keep lights dim if bright lights bother you and make your migraines worse. SEEK IMMEDIATE MEDICAL CARE IF:   Your migraine becomes severe.  You have a fever.  You have a stiff neck.  You have vision loss.  You have muscular weakness or loss of muscle control.  You start losing your balance or have trouble walking.  You feel faint or pass out.  You have severe symptoms that are different from your first symptoms. MAKE SURE YOU:   Understand these instructions.  Will watch your condition.  Will get help right away if you  are not doing well or get worse. Document Released: 01/11/2005 Document Revised: 05/28/2013 Document Reviewed: 09/18/2012 [facility] Patient Information 2015 [place], [place]. This information is not intended to replace advice given to you by your health care provider. Make sure you discuss any questions you have with your health care provider.

## 2014-04-18 ENCOUNTER — Telehealth: Payer: Self-pay | Admitting: Family

## 2014-04-18 NOTE — Telephone Encounter (Signed)
Can you please contact patient in regards to opthalmology referral.  If you do not reach patient she states to leave vm.

## 2014-04-23 ENCOUNTER — Telehealth: Payer: Self-pay | Admitting: Family

## 2014-04-23 ENCOUNTER — Telehealth: Payer: Self-pay | Admitting: *Deleted

## 2014-04-23 NOTE — Telephone Encounter (Signed)
PLEASE NOTE: All timestamps contained within this report are represented as [place] Standard Time. CONFIDENTIALTY NOTICE: This fax transmission is intended only for the addressee. It contains information that is legally privileged, confidential or otherwise protected from use or disclosure. If you are not the intended recipient, you are strictly prohibited from reviewing, disclosing, copying using or disseminating any of this information or taking any action in reliance on or regarding this information. If you have received this fax in error, please notify us immediately by telephone so that we can arrange for its return to [place]. Phone: [phone], Toll-Free: [phone], Fax: [phone] Page: 1 of 1 Call Id: [id number] [place] [facility] - Client [place] Patient Name: Christina [name] Gender: Female DOB: 1971-01-03 Age: 44 Y 9 M 13 D Return Phone Number: [phone] (Primary) Address: [address] [place] [address] Client [place] [facility] - Client Client Site [place] - Day Physician Terri Piedra Contact Type Call Call Type Triage / Clinical Relationship To Patient Self Appointment Disposition EMR Caller Not Reached Info pasted into Epic Yes Return Phone Number [phone] (Primary) Chief Complaint Arm Pain Initial Comment (call back at 9:40) Caller states has a cyst under arm, started with a lump that became pain, it burst Sun night, red and very tender, open. Asking nurse to leave voicemail if no answer Nurse Assessment Guidelines Guideline Title Affirmed Question Affirmed Notes Nurse Date/Time (Eastern Time) Disp. Time Eilene Ghazi Time) Disposition Final User 04/23/2014 9:25:54 AM Attempt made - message left Harlon Ditty 04/23/2014 9:32:31 AM Send To Clinical Follow Up Briscoe Burns, RN, Lynda 04/23/2014 9:41:09 AM Attempt made - message left Donalynn Furlong, RN,  Myna Hidalgo 04/23/2014 9:52:07 AM FINAL ATTEMPT MADE - message left Yes Donalynn Furlong, RN, Myna Hidalgo

## 2014-04-23 NOTE — Telephone Encounter (Signed)
[place] [facility] - Client [facility] Patient Name: Christina [name] Gender: Female DOB: February 25, 1970 Age: 44 Y 9 M 13 D Return Phone Number: [phone] (Primary) Address: [address] [place] [address] Client [place] [facility] - Client Client Site [place] - Day Physician Terri Piedra Contact Type Call Call Type Triage / Clinical Relationship To Patient Self Appointment Disposition EMR Appointment Scheduled Info pasted into Epic Yes Return Phone Number [phone] (Primary) Chief Complaint Arm Pain Initial Comment caller states she missed the nurses call - has had a cyst under her arm that burst - it is red and painful PreDisposition Did not know what to do Nurse Assessment Nurse: Kenton Kingfisher, RN, Meagan Date/Time ([place] Time): 04/23/2014 10:34:31 AM Confirm and document reason for call. If symptomatic, describe symptoms. ---Caller states had a hard cyst under left arm and is has now ruptured and it is red and tender. No fever. Caller does have an appt on Monday April 4th to see Dr. Elna Breslow but is not sure if she needs to wait. Has the patient traveled out of the country within the last 30 days? ---Not Applicable Does the patient require triage? ---Yes Related visit to physician within the last 2 weeks? ---No Does the PT have any chronic conditions? (i.e. diabetes, asthma, etc.) ---Yes List chronic conditions. ---hyperthyroidism Did the patient indicate they were pregnant? ---No Guidelines Guideline Title Affirmed Question Affirmed Notes Nurse Date/Time (Eastern Time) Boil (Skin Abscess) [1] Spreading redness around the boil AND [2] no fever Kenton Kingfisher, RN, Meagan 04/23/2014 10:37:00 AM Disp. Time Eilene Ghazi Time) Disposition Final User 04/23/2014 10:41:07 AM See Physician within 24 Hours Yes Kenton Kingfisher, RN, Meagan Caller Understands: Yes PLEASE NOTE: All timestamps contained  within this report are represented as [place] Standard Time. CONFIDENTIALTY NOTICE: This fax transmission is intended only for the addressee. It contains information that is legally privileged, confidential or otherwise protected from use or disclosure. If you are not the intended recipient, you are strictly prohibited from reviewing, disclosing, copying using or disseminating any of this information or taking any action in reliance on or regarding this information. If you have received this fax in error, please notify us immediately by telephone so that we can arrange for its return to [place]. Phone: [phone], Toll-Free: [phone], Fax: [phone] Page: 2 of 2 Call Id: [id number] Disagree/Comply: Comply Care Advice Given Per Guideline SEE PHYSICIAN WITHIN 24 HOURS: * IF OFFICE WILL BE OPEN: You need to be examined within the next 24 hours. Call your doctor when the office opens, and make an appointment. TREATMENT FOR A BOIL - GENERAL: * Do not squeeze a boil (Reason: This can push bacteria deeper into the skin). * Avoid touching or scratching the boil. * Keep your hands clean. Wash them with soap and water. TREATMENT FOR A BOIL - APPLY MOIST HEAT: * Heat can help bring the boil 'to a head' so that it can open and the pus can drain out. * Apply a warm, wet washcloth to the boil for 15 minutes 3 times a day. * If the boil drains pus: continue to apply a warm wet washcloth to the boil 3 times a day for three more days. TREATMENT FOR A BOIL - APPLY ANTIBIOTIC OINTMENT: Apply an over-the-counter antibiotic ointment (e.g., Bacitracin) to the area of redness three times daily. DRAINING PUS - IT IS CONTAGIOUS: Pus or other drainage from an open boil is very contagious (you can spread  it to others). DRAINING PUS - PREVENTING SPREAD TO YOURSELF AND OTHERS: * Wash the boil with soap and water each day. * Keep the boil covered with a clean dry dressing (e.g., gauze pad and tape). Throw the dirty dressings  into the regular trash. * Make certain to wash your hands with soap and water after changing the dressing. * Shower daily with an antibacterial soap. Allow the soap to remain on your skin for five minutes before rinsing. Showers are best because baths still leave many Staph bacteria on the skin. * Use a clean towel daily. * Launder any clothes, sheets, and towels that become contaminated with drainage. PAIN MEDICINES: ACETAMINOPHEN (E.G., TYLENOL): * Take 650 mg (two 325 mg pills) by mouth every 4-6 hours as needed. Each Regular Strength Tylenol pill has 325 mg of acetaminophen. The most you should take each day is 3,250 mg (10 Regular Strength pills a day). CALL BACK IF: * Severe pain or fever occurs * Widespread rash occurs * You become worse. CARE ADVICE per Boil and Abscess (Adult) guideline. After Care Instructions Given Call Event Type User Date / Time Description Referrals REFERRED TO PCP OFFICE

## 2014-04-23 NOTE — Telephone Encounter (Signed)
[place] [facility] - Client [facility]  Patient Name: Christina [name]  DOB: Sep 17, 1970    Initial Comment caller states she missed the nurses call - has had a cyst under her arm that burst - it is red and painful   Nurse Assessment  Nurse: Kenton Kingfisher, RN, Meagan Date/Time (Eastern Time): 04/23/2014 10:34:31 AM  Confirm and document reason for call. If symptomatic, describe symptoms. ---Caller states had a hard cyst under left arm and is has now ruptured and it is red and tender. No fever. Caller does have an appt on Monday April 4th to see Dr. Elna Breslow but is not sure if she needs to wait.  Has the patient traveled out of the country within the last 30 days? ---Not Applicable  Does the patient require triage? ---Yes  Related visit to physician within the last 2 weeks? ---No  Does the PT have any chronic conditions? (i.e. diabetes, asthma, etc.) ---Yes  List chronic conditions. ---hyperthyroidism  Did the patient indicate they were pregnant? ---No     Guidelines    Guideline Title Affirmed Question Affirmed Notes  Boil (Skin Abscess) [1] Spreading redness around the boil AND [2] no fever    Final Disposition User   See Physician within [place], Therapist, sports, [employer]

## 2014-04-24 ENCOUNTER — Ambulatory Visit (INDEPENDENT_AMBULATORY_CARE_PROVIDER_SITE_OTHER): Payer: Federal, State, Local not specified - PPO | Admitting: Family

## 2014-04-24 ENCOUNTER — Encounter: Payer: Self-pay | Admitting: Family

## 2014-04-24 VITALS — BP 118/80 | HR 80 | Temp 98.4°F | Resp 16 | Wt 158.0 lb

## 2014-04-24 DIAGNOSIS — L0292 Furuncle, unspecified: Secondary | ICD-10-CM | POA: Diagnosis not present

## 2014-04-24 MED ORDER — SULFAMETHOXAZOLE-TRIMETHOPRIM 800-160 MG PO TABS: 1.0000 | ORAL_TABLET | Freq: Two times a day (BID) | ORAL | Status: DC

## 2014-04-24 NOTE — Patient Instructions (Signed)
Thank you for choosing [facility].  [facility] [address]  [place]  [place], [place]   Summary/Instructions:  Your prescription(s) have been submitted to your pharmacy or been printed and provided for you. Please take as directed and contact our office if you believe you are having problem(s) with the medication(s) or have any questions.  If your symptoms worsen or fail to improve, please contact our office for further instruction, or in case of emergency go directly to the emergency room at the closest medical facility.   Abscess An abscess is an infected area that contains a collection of pus and debris.It can occur in almost any part of the body. An abscess is also known as a furuncle or boil. CAUSES  An abscess occurs when tissue gets infected. This can occur from blockage of oil or sweat glands, infection of hair follicles, or a minor injury to the skin. As the body tries to fight the infection, pus collects in the area and creates pressure under the skin. This pressure causes pain. People with weakened immune systems have difficulty fighting infections and get certain abscesses more often.  SYMPTOMS Usually an abscess develops on the skin and becomes a painful mass that is red, warm, and tender. If the abscess forms under the skin, you may feel a moveable soft area under the skin. Some abscesses break open (rupture) on their own, but most will continue to get worse without care. The infection can spread deeper into the body and eventually into the bloodstream, causing you to feel ill.  DIAGNOSIS  Your caregiver will take your medical history and perform a physical exam. A sample of fluid may also be taken from the abscess to determine what is causing your infection. TREATMENT  Your caregiver may prescribe antibiotic medicines to fight the infection. However, taking antibiotics alone usually does not cure an abscess. Your caregiver may need  to make a small cut (incision) in the abscess to drain the pus. In some cases, gauze is packed into the abscess to reduce pain and to continue draining the area. HOME CARE INSTRUCTIONS   Only take over-the-counter or prescription medicines for pain, discomfort, or fever as directed by your caregiver.  If you were prescribed antibiotics, take them as directed. Finish them even if you start to feel better.  If gauze is used, follow your caregiver's directions for changing the gauze.  To avoid spreading the infection:  Keep your draining abscess covered with a bandage.  Wash your hands well.  Do not share personal care items, towels, or whirlpools with others.  Avoid skin contact with others.  Keep your skin and clothes clean around the abscess.  Keep all follow-up appointments as directed by your caregiver. SEEK MEDICAL CARE IF:   You have increased pain, swelling, redness, fluid drainage, or bleeding.  You have muscle aches, chills, or a general ill feeling.  You have a fever. MAKE SURE YOU:   Understand these instructions.  Will watch your condition.  Will get help right away if you are not doing well or get worse. Document Released: 10/21/2004 Document Revised: 07/13/2011 Document Reviewed: 03/26/2011 [facility] Patient Information 2015 [place], [place]. This information is not intended to replace advice given to you by your health care provider. Make sure you discuss any questions you have with your health care provider.

## 2014-04-24 NOTE — Assessment & Plan Note (Signed)
Symptoms and exam consistent with a boil. Start bactrim to cover for MRSA. Continue to keep the site clean and dry. Follow up if symptoms worsen or fail to improve.

## 2014-04-24 NOTE — Progress Notes (Signed)
Pre visit review using our clinic review tool, if applicable. No additional management support is needed unless otherwise documented below in the visit note. 

## 2014-04-24 NOTE — Progress Notes (Signed)
   Subjective:    Patient ID: Christina [name], female    DOB: 1970/07/27, 44 y.o.   MRN: [id number]  Chief Complaint  Patient presents with  . Recurrent Skin Infections    Boil    HPI:  Christina [name] is a 44 y.o. female who presents today for an acute visit.  Associated symptom of a boil located under left arm has been there for about 1 week. Notes the site started increasing in size and on Sunday night it burst. Has tried warm compresses and neosporin which has helped some. Pain is described as sharp with intensity of 8/10. Notes that it is slightly decreased now.   No Known Allergies   Current Outpatient Prescriptions on File Prior to Visit  Medication Sig Dispense Refill  . aspirin 81 MG tablet Take 81 mg by mouth daily.    . Biotin w/ Vitamins C & E 1250-7.5-7.5 MCG-MG-UNT CHEW Chew 2 tablets by mouth daily.    . cholecalciferol (VITAMIN D) 1000 UNITS tablet Take 1,000 Units by mouth daily.    . methimazole (TAPAZOLE) 5 MG tablet Take 5 mg by mouth every Monday, Wednesday, and Friday.     . naproxen (NAPROSYN) 500 MG tablet Take 1 tablet (500 mg total) by mouth 2 (two) times daily as needed. 60 tablet 2   No current facility-administered medications on file prior to visit.     Review of Systems  Constitutional: Negative for fever and chills.  Skin: Positive for rash.      Objective:    BP 118/80 mmHg  Pulse 80  Temp(Src) 98.4 F (36.9 C) (Oral)  Resp 16  Wt 158 lb (71.668 kg)  SpO2 95% Nursing note and vital signs reviewed.  Physical Exam  Constitutional: She is oriented to person, place, and time. She appears well-developed and well-nourished. No distress.  Cardiovascular: Normal rate, regular rhythm, normal heart sounds and intact distal pulses.   Pulmonary/Chest: Effort normal and breath sounds normal.  Neurological: She is alert and oriented to person, place, and time.  Skin: Skin is warm and dry.  1 inch oval, raised, with firm base and open at the apex  located under her left arm in the anterior aspect of her axilla. Mild inflammation present.   Psychiatric: She has a normal mood and affect. Her behavior is normal. Judgment and thought content normal.       Assessment & Plan:

## 2014-04-24 NOTE — Telephone Encounter (Signed)
Noted. Patient has office appointment today.

## 2014-04-29 ENCOUNTER — Encounter: Payer: Self-pay | Admitting: Family

## 2014-04-29 ENCOUNTER — Ambulatory Visit (INDEPENDENT_AMBULATORY_CARE_PROVIDER_SITE_OTHER): Payer: Federal, State, Local not specified - PPO | Admitting: Family

## 2014-04-29 VITALS — BP 112/82 | HR 86 | Temp 98.5°F | Resp 18 | Ht 67.0 in | Wt 157.8 lb

## 2014-04-29 DIAGNOSIS — L0292 Furuncle, unspecified: Secondary | ICD-10-CM | POA: Diagnosis not present

## 2014-04-29 NOTE — Progress Notes (Signed)
Pre visit review using our clinic review tool, if applicable. No additional management support is needed unless otherwise documented below in the visit note. 

## 2014-04-29 NOTE — Patient Instructions (Signed)
Thank you for choosing [facility].  Summary/Instructions:  Keep it clean  Please finish the antibiotic.  If your symptoms worsen or fail to improve, please contact our office for further instruction, or in case of emergency go directly to the emergency room at the closest medical facility.

## 2014-04-29 NOTE — Progress Notes (Signed)
   Subjective:    Patient ID: Christina [name]. Christina [name], female    DOB: 20-Oct-1970, 44 y.o.   MRN: [id number]  Chief Complaint  Patient presents with  . Follow-up    Boil    HPI:  Christina D. Christina [name] is a 44 y.o. female who presents today for follow up of boil.  Previously seen for a boil located in the anterior of her left axilla and was started on  Bactrim. Notes that the skin around the area is slightly darker. Notes that the site still has some drainage but it appears to be less and the pain is noted to be less. Denies any fevers, chills, or adverse reactions to the medication.  No Known Allergies  Current Outpatient Prescriptions on File Prior to Visit  Medication Sig Dispense Refill  . aspirin 81 MG tablet Take 81 mg by mouth daily.    . Biotin w/ Vitamins C & E 1250-7.5-7.5 MCG-MG-UNT CHEW Chew 2 tablets by mouth daily.    . cholecalciferol (VITAMIN D) 1000 UNITS tablet Take 1,000 Units by mouth daily.    . methimazole (TAPAZOLE) 5 MG tablet Take 5 mg by mouth every Monday, Wednesday, and Friday.     . naproxen (NAPROSYN) 500 MG tablet Take 1 tablet (500 mg total) by mouth 2 (two) times daily as needed. 60 tablet 2  . sulfamethoxazole-trimethoprim (BACTRIM DS,SEPTRA DS) 800-160 MG per tablet Take 1 tablet by mouth 2 (two) times daily. 20 tablet 0   No current facility-administered medications on file prior to visit.     Review of Systems  Constitutional: Negative for fever and chills.  Skin: Positive for rash.      Objective:    BP 112/82 mmHg  Pulse 86  Temp(Src) 98.5 F (36.9 C) (Oral)  Resp 18  Ht 5\' 7"  (1.702 m)  Wt 157 lb 12.8 oz (71.578 kg)  BMI 24.71 kg/m2  SpO2 95% Nursing note and vital signs reviewed.  Physical Exam  Constitutional: She is oriented to person, place, and time. She appears well-developed and well-nourished. No distress.  Cardiovascular: Normal rate, regular rhythm, normal heart sounds and intact distal pulses.   Pulmonary/Chest: Effort normal and  breath sounds normal.  Neurological: She is alert and oriented to person, place, and time.  Skin: Skin is warm and dry.  1 inch oval with reddened base, whiteish gray discharge which has a slight odor to it.   Psychiatric: She has a normal mood and affect. Her behavior is normal. Judgment and thought content normal.       Assessment & Plan:

## 2014-04-29 NOTE — Assessment & Plan Note (Signed)
Boil has decreased in size since the start of antibiotics. Wound was expressed and mild amount of purulent discharge removed. Cleansed and recovered. Patient tolerated without problem. Continue Bactrim until complete. Follow up if symptoms worsen or do not continue to improve.

## 2014-04-30 ENCOUNTER — Telehealth: Payer: Self-pay | Admitting: *Deleted

## 2014-04-30 NOTE — Telephone Encounter (Signed)
[place] [facility] - Client [facility] Patient Name: MISA FEDORKO Gender: Female DOB: [birth year] Age: 44 Y 9 M 15 D Return Phone Number: [phone] (Primary) Address: [address] City/State/Zip: [place] [address] Client [place] [facility] - Client Client Site [place] - Day Physician Terri Piedra Contact Type Call Call Type Triage / Clinical Relationship To Patient Self Appointment Disposition EMR Caller Not Reached Info pasted into Epic Yes Return Phone Number [phone] (Primary) Chief Complaint Arm Pain Initial Comment (call back at 9:40) Caller states has a cyst under arm, started with a lump that became pain, it burst Sun night, red and very tender, open. Asking nurse to leave voicemail if no answer Nurse Assessment Guidelines Guideline Title Affirmed Question Affirmed Notes Nurse Date/Time (Eastern Time) Disp. Time Eilene Ghazi Time) Disposition Final User 04/23/2014 9:25:54 AM Attempt made - message left Harlon Ditty 04/23/2014 9:32:31 AM Send To Clinical Follow Up Briscoe Burns, RN, Lynda 04/23/2014 9:41:09 AM Attempt made - message left Donalynn Furlong, RN, Myna Hidalgo 04/23/2014 9:57:20 AM Call Completed Donalynn Furlong, RN, Myna Hidalgo 04/23/2014 9:52:07 AM FINAL ATTEMPT MADE - message left Yes Donalynn Furlong, RN, Myna Hidalgo After Care Instructions Given Call Event Type User Date / Time Description

## 2014-05-03 ENCOUNTER — Encounter: Payer: Self-pay | Admitting: Neurology

## 2014-05-03 ENCOUNTER — Ambulatory Visit (INDEPENDENT_AMBULATORY_CARE_PROVIDER_SITE_OTHER): Payer: Federal, State, Local not specified - PPO | Admitting: Neurology

## 2014-05-03 ENCOUNTER — Telehealth: Payer: Self-pay | Admitting: *Deleted

## 2014-05-03 ENCOUNTER — Other Ambulatory Visit: Payer: Self-pay | Admitting: *Deleted

## 2014-05-03 VITALS — BP 102/70 | HR 68 | Temp 98.2°F | Resp 16 | Ht 67.0 in | Wt 156.8 lb

## 2014-05-03 DIAGNOSIS — G43109 Migraine with aura, not intractable, without status migrainosus: Secondary | ICD-10-CM | POA: Diagnosis not present

## 2014-05-03 DIAGNOSIS — G453 Amaurosis fugax: Secondary | ICD-10-CM | POA: Diagnosis not present

## 2014-05-03 DIAGNOSIS — G43009 Migraine without aura, not intractable, without status migrainosus: Secondary | ICD-10-CM

## 2014-05-03 DIAGNOSIS — G43409 Hemiplegic migraine, not intractable, without status migrainosus: Secondary | ICD-10-CM | POA: Diagnosis not present

## 2014-05-03 MED ORDER — TOPIRAMATE 25 MG PO TABS: 25.0000 mg | ORAL_TABLET | Freq: Every day | ORAL | Status: DC

## 2014-05-03 MED ORDER — DICLOFENAC POTASSIUM(MIGRAINE) 50 MG PO PACK: PACK | ORAL | Status: DC

## 2014-05-03 MED ORDER — SIMVASTATIN 20 MG PO TABS: 20.0000 mg | ORAL_TABLET | Freq: Every day | ORAL | Status: DC

## 2014-05-03 NOTE — Telephone Encounter (Signed)
Patient will pick up lab slip for Lipid panel Monday

## 2014-05-03 NOTE — Patient Instructions (Signed)
1.  At earliest onset of headache, take 50mg  packet of [place]ia.  Do not take naproxen with it. 2.  Start topiramate 25mg  at bedtime.  Call in 4 weeks with update 3.  Take full strength 325mg  daily aspirin 4.  I would like you to be evaluated by vascular surgeon for the narrowing of the carotid artery.   5.  Follow up in 2 years.

## 2014-05-03 NOTE — Telephone Encounter (Signed)
-----   Message from Pieter Partridge, DO sent at 05/03/2014 11:59 AM EDT ----- I spoke with the patient to let her know that I would like to check a lipid panel as well.

## 2014-05-03 NOTE — Progress Notes (Signed)
NEUROLOGY CONSULTATION NOTE  Christina [name] MRN: [id number] DOB: 01-24-1971  Referring provider: Philipp Deputy, MD (ED referral) Primary care provider: Mauricio Po, FNP  Reason for consult:  Migraine  HISTORY OF PRESENT ILLNESS: Christina [name] is a 44 year old left-handed woman with hyperthyroidism who presents for headache.  Records, MRI of brain and CTA of head and neck reviewed.  On 03/10/14, she developed sudden onset of vision loss in the left eye.  Over the next 30 minutes, she developed clumsiness and heaviness of her right arm and hand.  There was no speech or language disturbance.  This lasted a couple of hours.  Several hours later, she developed a severe 8-9/10 nonthrobbing left sided headache.  It was associated with nausea, photophobia and phonophobia.  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  MRI of brain performed on 03/11/14 was normal.  She was advised to start ASA 81mg  daily.  She received a cocktail, which eased the pain.  The headache persisted without aura symptoms at an intensity of 5/10.  Since it did not completely resolve, she returned to the ED about a week later.  She received an Imitrex injection which caused the left vision loss aura, which persisted off an on for a few days.  The headache persisted for 2 weeks before it completely resolved.  She did not have the focal loss of fine-motor skills in the right hand.    Since then, she has had 3 other episodes of headache without the visual aura and focal weakness.  It is bi-frontal and throbbing, about 5/10.  It is associated with photophobia and phonophobia, but not nausea.  She will take naproxen 500mg  and cup of coffee which eases the pain.  It will last a couple of days.  She currently has a mild headache which started 2 days ago.  She does not want a  Toradol injection.  She has no prior history of migraine.  Her father had migraines.  There is no family history of first degree relatives with stroke  Caffeine:  Dr. Malachi Bonds Alcohol:  no Smoker:  no Diet:  Healthy.  Stays hydrated Exercise:  no Depression/stress:  Some stress related to her mother's illness (colon cancer) Sleep hygiene:  Good.  PAST MEDICAL HISTORY: Past Medical History  Diagnosis Date  . Hyperthyroidism   . Migraines     PAST SURGICAL HISTORY: No past surgical history on file.  MEDICATIONS: Current Outpatient Prescriptions on File Prior to Visit  Medication Sig Dispense Refill  . aspirin 81 MG tablet Take 81 mg by mouth daily.    . Biotin w/ Vitamins C & E 1250-7.5-7.5 MCG-MG-UNT CHEW Chew 2 tablets by mouth daily.    . cholecalciferol (VITAMIN D) 1000 UNITS tablet Take 1,000 Units by mouth daily.    . methimazole (TAPAZOLE) 5 MG tablet Take 5 mg by mouth every Monday, Wednesday, and Friday.     . naproxen (NAPROSYN) 500 MG tablet Take 1 tablet (500 mg total) by mouth 2 (two) times daily as needed. 60 tablet 2  . sulfamethoxazole-trimethoprim (BACTRIM DS,SEPTRA DS) 800-160 MG per tablet Take 1 tablet by mouth 2 (two) times daily. 20 tablet 0   No current facility-administered medications on file prior to visit.    ALLERGIES: No Known Allergies  FAMILY HISTORY: Family History  Problem Relation Age of  Onset  . Thyroid disease Neg Hx   . Diabetes Mother   . Hypertension Mother   . Colon cancer Mother   . Breast cancer Mother   . Prostate cancer Father     SOCIAL HISTORY: History   Social History  . Marital Status: Single    Spouse Name: N/A  . Number of Children: 1  . Years of Education: 14   Occupational History  . Mail Processor    Social History Main Topics  . Smoking status: Never Smoker   . Smokeless tobacco: Never Used  . Alcohol Use: No  . Drug Use: No  . Sexual Activity:    Partners: Male   Other Topics Concern  . Not on  file   Social History Narrative   Born and raised in [place]. Currently resides in a house with her child. No pets. Fun: Go to the movies.    Denies religious beliefs effecting health care.     REVIEW OF SYSTEMS: Constitutional: No fevers, chills, or sweats, no generalized fatigue, change in appetite Eyes: No visual changes, double vision, eye pain Ear, nose and throat: No hearing loss, ear pain, nasal congestion, sore throat Cardiovascular: No chest pain, palpitations Respiratory:  No shortness of breath at rest or with exertion, wheezes GastrointestinaI: No nausea, vomiting, diarrhea, abdominal pain, fecal incontinence Genitourinary:  No dysuria, urinary retention or frequency Musculoskeletal:  No neck pain, back pain Integumentary: No rash, pruritus, skin lesions Neurological: as above Psychiatric: No depression, insomnia, anxiety Endocrine: No palpitations, fatigue, diaphoresis, mood swings, change in appetite, change in weight, increased thirst Hematologic/Lymphatic:  No anemia, purpura, petechiae. Allergic/Immunologic: no itchy/runny eyes, nasal congestion, recent allergic reactions, rashes  PHYSICAL EXAM: Filed Vitals:   05/03/14 0820  BP: 102/70  Pulse: 68  Temp: 98.2 F (36.8 C)  Resp: 16   General: No acute distress Head:  Normocephalic/atraumatic Eyes:  fundi unremarkable, without vessel changes, exudates, hemorrhages or papilledema. Neck: supple, no paraspinal tenderness, full range of motion Back: No paraspinal tenderness Heart: regular rate and rhythm Lungs: Clear to auscultation bilaterally. Vascular: No carotid bruits. Neurological Exam: Mental status: alert and oriented to person, place, and time, recent and remote memory intact, fund of knowledge intact, attention and concentration intact, speech fluent and not dysarthric, language intact. Cranial nerves: CN I: not tested CN II: pupils equal, round and reactive to light, visual fields intact, fundi  unremarkable, without vessel changes, exudates, hemorrhages or papilledema. CN III, IV, VI:  full range of motion, no nystagmus, no ptosis CN V: facial sensation intact CN VII: upper and lower face symmetric CN VIII: hearing intact CN IX, X: gag intact, uvula midline CN XI: sternocleidomastoid and trapezius muscles intact CN XII: tongue midline Bulk & Tone: normal, no fasciculations. Motor:  5/5 throughout Sensation:  Temperature and vibration intact Deep Tendon Reflexes:  2+ throughout, toes downgoing Finger to nose testing:  No dysmetria Heel to shin:  No dysmetria Gait:  Normal station and stride.  Able to turn and walk in tandem. Romberg negative.  IMPRESSION: 1  New-onset migraine with visual aura 2.  Episode of hemiplegic migraine 3.  Amaurosis fugax.  It is possible that it is a symptom of migraine, however she does have some moderate left ICA stenosis.  Her visual symptoms and right hand clumsiness correlate with this vascular territory.  Therefore, symptomatic carotid stenosis cannot be ruled out.  PLAN: 1.  Will start topiramate 25mg  at bedtime 2.  Given the aura of stroke-like symptoms (and  the fact that Imitrex exacerbated these auras), I would not treat her with triptans.  Instead, we will try Cambia.  If she cannot afford it, then we will try diclofenac 50mg  tablet 3.  I recommend switching to 325mg  aspirin daily. I would also like her to start simvastatin 20mg  daily (We can check a lipid panel as well) 4.  Since her initial symptoms correlate with the distribution of the left ICA,  I would like the opinion and evaluation of a vascular surgeon regarding the moderate left ICA stenosis.  She will continue antiplatelet therapy and at least will monitor and re-image in 6 months. 5.  She has upcoming eye appointment. 6.  Follow up in 2 months.  Thank you for allowing me to take part in the care of this patient.  Metta Clines, DO  CC:  Mauricio Po, FNP

## 2014-05-07 LAB — LIPID PANEL
Cholesterol: 107 mg/dL (ref 0–200)
HDL: 21 mg/dL — AB (ref >=46)
LDL CALC: 66 mg/dL (ref 0–99)
Total CHOL/HDL Ratio: 5.1 Ratio
Triglycerides: 99 mg/dL (ref <150)
VLDL: 20 mg/dL (ref 0–40)

## 2014-05-09 ENCOUNTER — Telehealth: Payer: Self-pay | Admitting: Family Medicine

## 2014-05-09 NOTE — Telephone Encounter (Signed)
-----   Message from Pieter Partridge, DO sent at 05/09/2014  8:57 AM EDT ----- Her cholesterol looks okay except for her HDL which is very low.  I would continue the simvastatin for now until evaluated by vascular surgery.  Increasing exercise may help increase the HDL level (which should be higher). ----- Message -----    From: [facility] Interface    Sent: 05/07/2014   8:02 AM      To: Pieter Partridge, DO

## 2014-05-09 NOTE — Telephone Encounter (Signed)
Lmovm to return my call. 

## 2014-05-10 NOTE — Telephone Encounter (Signed)
Called patient. Notified of results & advisement.

## 2014-05-10 NOTE — Telephone Encounter (Signed)
Pt called/returning your call.  C/B [phone]

## 2014-05-13 ENCOUNTER — Telehealth: Payer: Self-pay | Admitting: Neurology

## 2014-05-13 ENCOUNTER — Emergency Department (HOSPITAL_COMMUNITY)
Admission: EM | Admit: 2014-05-13 | Discharge: 2014-05-14 | Disposition: A | Discharge status: Home / Self Care | Payer: Federal, State, Local not specified - PPO | Attending: Emergency Medicine | Admitting: Emergency Medicine

## 2014-05-13 ENCOUNTER — Encounter (HOSPITAL_COMMUNITY): Payer: Self-pay | Admitting: Emergency Medicine

## 2014-05-13 ENCOUNTER — Telehealth: Payer: Self-pay | Admitting: *Deleted

## 2014-05-13 DIAGNOSIS — Z791 Long term (current) use of non-steroidal anti-inflammatories (NSAID): Secondary | ICD-10-CM | POA: Insufficient documentation

## 2014-05-13 DIAGNOSIS — Z8639 Personal history of other endocrine, nutritional and metabolic disease: Secondary | ICD-10-CM | POA: Diagnosis not present

## 2014-05-13 DIAGNOSIS — Z79899 Other long term (current) drug therapy: Secondary | ICD-10-CM | POA: Insufficient documentation

## 2014-05-13 DIAGNOSIS — Z7982 Long term (current) use of aspirin: Secondary | ICD-10-CM | POA: Diagnosis not present

## 2014-05-13 DIAGNOSIS — R531 Weakness: Secondary | ICD-10-CM | POA: Diagnosis present

## 2014-05-13 DIAGNOSIS — R519 Headache, unspecified: Secondary | ICD-10-CM

## 2014-05-13 DIAGNOSIS — G43909 Migraine, unspecified, not intractable, without status migrainosus: Secondary | ICD-10-CM | POA: Insufficient documentation

## 2014-05-13 DIAGNOSIS — R51 Headache: Secondary | ICD-10-CM

## 2014-05-13 LAB — CBC
HEMATOCRIT: 39.6 % (ref 36.0–46.0)
HEMOGLOBIN: 13 g/dL (ref 12.0–15.0)
MCH: 28.1 pg (ref 26.0–34.0)
MCHC: 32.8 g/dL (ref 30.0–36.0)
MCV: 85.5 fL (ref 78.0–100.0)
Platelets: 308 10*3/uL (ref 150–400)
RBC: 4.63 MIL/uL (ref 3.87–5.11)
RDW: 12.2 % (ref 11.5–15.5)
WBC: 5.4 10*3/uL (ref 4.0–10.5)

## 2014-05-13 LAB — BASIC METABOLIC PANEL
ANION GAP: 4 — AB (ref 5–15)
BUN: 12 mg/dL (ref 6–23)
CALCIUM: 8.6 mg/dL (ref 8.4–10.5)
CO2: 26 mmol/L (ref 19–32)
CREATININE: 0.88 mg/dL (ref 0.50–1.10)
Chloride: 104 mmol/L (ref 96–112)
GFR calc Af Amer: >90 mL/min (ref >90)
GFR calc non Af Amer: 79 mL/min — ABNORMAL LOW (ref >90)
Glucose, Bld: 94 mg/dL (ref 70–99)
Potassium: 3.8 mmol/L (ref 3.5–5.1)
Sodium: 134 mmol/L — ABNORMAL LOW (ref 135–145)

## 2014-05-13 MED ORDER — KETOROLAC TROMETHAMINE 30 MG/ML IJ SOLN
30.0000 mg | Freq: Once | INTRAMUSCULAR | Status: AC
  Administered 2014-05-14: 30 mg via INTRAVENOUS
  Filled 2014-05-13: qty 1

## 2014-05-13 MED ORDER — DIPHENHYDRAMINE HCL 50 MG/ML IJ SOLN
12.5000 mg | Freq: Once | INTRAMUSCULAR | Status: AC
  Administered 2014-05-14: 12.5 mg via INTRAVENOUS
  Filled 2014-05-13: qty 1

## 2014-05-13 MED ORDER — SODIUM CHLORIDE 0.9 % IV BOLUS (SEPSIS)
1000.0000 mL | Freq: Once | INTRAVENOUS | Status: AC
  Administered 2014-05-14: 1000 mL via INTRAVENOUS

## 2014-05-13 MED ORDER — METOCLOPRAMIDE HCL 5 MG/ML IJ SOLN
10.0000 mg | Freq: Once | INTRAMUSCULAR | Status: AC
  Administered 2014-05-14: 10 mg via INTRAVENOUS
  Filled 2014-05-13: qty 2

## 2014-05-13 NOTE — Telephone Encounter (Signed)
The topamax is taken daily.  The Cathren Harsh is taken only when she gets the headache (like Tylenol or Advil).   To clarify, is it left-sided or right-sided weakness?  She told me her weakness was the right-side.

## 2014-05-13 NOTE — Telephone Encounter (Signed)
Patient states Pt reports feeling burning sensation at her back side, described as her "back pocket" area. It comes and goes. Says she thinks it may be a side effect from starting Simvastatin on Monday of last week she is having some LT sided weakness maybe every other day lt arm and lt leg . She reports she has not started her migraine meds she is asking can she take the Cambia and Topamax together ?  Please advise

## 2014-05-13 NOTE — Telephone Encounter (Signed)
Left message for patient to return my call at 2:08pm

## 2014-05-13 NOTE — Telephone Encounter (Signed)
Pt reports feeling burning sensation at her back side, described as her "back pocket" area. It comes and goes. Says she thinks it may be a side effect from starting Simvastatin on Monday of last week. She reports she has not started her migraine meds. Please call before 230pm. CB# [phone] / Sherri S.

## 2014-05-13 NOTE — ED Notes (Signed)
Pt reports left side weakness and pain. Pt seen for same and told it was caused by her migraines but pt reports she has not experienced the pain in her legs before. Pt is alert and oriented.

## 2014-05-13 NOTE — ED Provider Notes (Signed)
CSN: [id number]     Arrival date & time 05/13/14  2005 History   First MD Initiated Contact with Patient 05/13/14 2252     Chief Complaint  Patient presents with  . Weakness     (Consider location/radiation/quality/duration/timing/severity/associated sxs/prior Treatment) HPI  Christina [name] is a 44 y.o. female with PMH of migraine with aura presenting with headache that started earlier today is described as throbbing and like other headaches he's had before. Developed gradually and was relieved with naproxen. No neck pain.  the patient experienced a bulging pain in left upper shoulder many and left lower extremity as atypical for patient. Patient states it is associated with weakness but this has resolved. Pain is much better and is intermittent at times. Christina [name] denies alleviating RA factors. No vertiginous symptoms no head injury patient is on anticoagulants. Patient followed by [place] neuro by Dr. Tomi Likens with appointment 10 days ago. CT head and MRI brain 02/2014 and normal.   Past Medical History  Diagnosis Date  . Hyperthyroidism   . Migraines    History reviewed. No pertinent past surgical history. Family History  Problem Relation Age of Onset  . Thyroid disease Neg Hx   . Diabetes Mother   . Hypertension Mother   . Colon cancer Mother   . Breast cancer Mother   . Prostate cancer Father    History  Substance Use Topics  . Smoking status: Never Smoker   . Smokeless tobacco: Never Used  . Alcohol Use: No   OB History    No data available     Review of Systems 10 Systems reviewed and are negative for acute change except as noted in the HPI.    Allergies  Review of patient's allergies indicates no known allergies.  Home Medications   Prior to Admission medications   Medication Sig Start Date End Date Taking? Authorizing Provider  aspirin 325 MG tablet Take 325 mg by mouth daily.   Yes Historical Provider, MD  Biotin w/ Vitamins C & E 1250-7.5-7.5 MCG-MG-UNT CHEW Chew 2  tablets by mouth daily.   Yes Historical Provider, MD  cholecalciferol (VITAMIN D) 1000 UNITS tablet Take 1,000 Units by mouth daily.   Yes Historical Provider, MD  naproxen (NAPROSYN) 500 MG tablet Take 1 tablet (500 mg total) by mouth 2 (two) times daily as needed. Patient taking differently: Take 500 mg by mouth 2 (two) times daily as needed for mild pain or moderate pain (pain).  04/03/14  Yes Golden Circle, FNP  simvastatin (ZOCOR) 20 MG tablet Take 1 tablet (20 mg total) by mouth daily. 05/03/14  Yes Pieter Partridge, DO  Diclofenac Potassium 50 MG PACK Take 50mg  at earliest onset of headache.  Take on empty stomach if possible. 05/03/14   Pieter Partridge, DO  methimazole (TAPAZOLE) 5 MG tablet Take 5 mg by mouth every Monday, Wednesday, and Friday.     Historical Provider, MD  sulfamethoxazole-trimethoprim (BACTRIM DS,SEPTRA DS) 800-160 MG per tablet Take 1 tablet by mouth 2 (two) times daily. Patient not taking: Reported on 05/13/2014 04/24/14   Golden Circle, FNP  topiramate (TOPAMAX) 25 MG tablet Take 1 tablet (25 mg total) by mouth at bedtime. Patient not taking: Reported on 05/13/2014 05/03/14   Pieter Partridge, DO   BP 120/82 mmHg  Pulse 72  Temp(Src) 98.5 F (36.9 C)  Resp 16  SpO2 97%  LMP 05/03/2014 Physical Exam  Constitutional: Christina [name] appears well-developed and well-nourished. No distress.  HENT:  Head: Normocephalic and atraumatic.  Mouth/Throat: Oropharynx is clear and moist.  Eyes: Conjunctivae and EOM are normal. Pupils are equal, round, and reactive to light. Right eye exhibits no discharge. Left eye exhibits no discharge.  Neck: Normal range of motion. Neck supple.  No nuchal rigidity  Cardiovascular: Normal rate and regular rhythm.   2+ radial pulses equal bilaterally  Pulmonary/Chest: Effort normal and breath sounds normal. No respiratory distress. Christina [name] has no wheezes.  Abdominal: Soft. Bowel sounds are normal. Christina [name] exhibits no distension. There is no tenderness.   Musculoskeletal:  No clavicular step off or crepitus is no right or left-sided shoulder deformity or swelling. Mild tenderness submitted Christina [name] wears without swelling or skin changes.  Neurological: Christina [name] is alert. No cranial nerve deficit. Coordination normal.  Speech is clear and goal oriented. Peripheral visual fields intact. Strength 5/5 in upper and lower extremities. Sensation intact. Intact rapid alternating movements, finger to nose, and heel to shin. Negative Romberg. No pronator drift. Normal gait.   Skin: Skin is warm and dry. Christina [name] is not diaphoretic.  Nursing note and vitals reviewed.   ED Course  Procedures (including critical care time) Labs Review Labs Reviewed  BASIC METABOLIC PANEL - Abnormal; Notable for the following:    Sodium 134 (*)    GFR calc non Af Amer 79 (*)    Anion gap 4 (*)    All other components within normal limits  CBC    Imaging Review No results found.   EKG Interpretation   Date/Time:  Monday May 13 2014 20:27:43 EDT Ventricular Rate:  82 PR Interval:  141 QRS Duration: 76 QT Interval:  376 QTC Calculation: 439 R Axis:   58 Text Interpretation:  Sinus rhythm Probable left atrial enlargement no  significant change since Feb 2016 Confirmed by GOLDSTON  MD, SCOTT ([id number])  on 05/13/2014 11:25:28 PM      MDM   Final diagnoses:  Nonintractable headache   Pt presenting with HA with weakness and " bulging" pain in upper extremity which is typical but with new reported weakness and pain in left lower extremity. No visual changes. No neck pain. VSS. Neurological exam without deficits. After headache cocktail in ED pt reported no numbness or pain in left upper and lower extremities. Pt denies any symptoms at this time. I doubt ICH, SAH or meningitis. Pt likely with complex migraine. Pt to follow up with neurologist.  Discussed return precautions with patient. Discussed all results and patient verbalizes understanding and agrees with plan.  Case  has been discussed with Dr. Betsey Holiday who agrees with the above plan and to discharge.       Al Corpus, PA-C 05/14/14 [place], MD 05/17/14 [phone]

## 2014-05-13 NOTE — ED Notes (Signed)
Patient reports a "bulging pain in LUE and LLE."  Reports headache she had earlier today was relieved with naproxen, but pain in extremities continues. No difficulty with ambulation noted.

## 2014-05-14 NOTE — Discharge Instructions (Signed)
Return to the emergency room with worsening of symptoms, new symptoms or with symptoms that are concerning , especially severe worsening of headache, visual or speech changes, weakness in face, arms or legs. Please call your neurological doctor for a followup appointment within 24-48 hours. When you talk to your doctor please let them know that you were seen in the emergency department and have them acquire all of your records so that they can discuss the findings with you and formulate a treatment plan to fully care for your new and ongoing problems.  Read below information and follow recommendations. Migraine Headache A migraine headache is an intense, throbbing pain on one or both sides of your head. A migraine can last for 30 minutes to several hours. CAUSES  The exact cause of a migraine headache is not always known. However, a migraine may be caused when nerves in the brain become irritated and release chemicals that cause inflammation. This causes pain. Certain things may also trigger migraines, such as:  Alcohol.  Smoking.  Stress.  Menstruation.  Aged cheeses.  Foods or drinks that contain nitrates, glutamate, aspartame, or tyramine.  Lack of sleep.  Chocolate.  Caffeine.  Hunger.  Physical exertion.  Fatigue.  Medicines used to treat chest pain (nitroglycerine), birth control pills, estrogen, and some blood pressure medicines. SIGNS AND SYMPTOMS  Pain on one or both sides of your head.  Pulsating or throbbing pain.  Severe pain that prevents daily activities.  Pain that is aggravated by any physical activity.  Nausea, vomiting, or both.  Dizziness.  Pain with exposure to bright lights, loud noises, or activity.  General sensitivity to bright lights, loud noises, or smells. Before you get a migraine, you may get warning signs that a migraine is coming (aura). An aura may include:  Seeing flashing lights.  Seeing bright spots, halos, or zigzag  lines.  Having tunnel vision or blurred vision.  Having feelings of numbness or tingling.  Having trouble talking.  Having muscle weakness. DIAGNOSIS  A migraine headache is often diagnosed based on:  Symptoms.  Physical exam.  A CT scan or MRI of your head. These imaging tests cannot diagnose migraines, but they can help rule out other causes of headaches. TREATMENT Medicines may be given for pain and nausea. Medicines can also be given to help prevent recurrent migraines.  HOME CARE INSTRUCTIONS  Only take over-the-counter or prescription medicines for pain or discomfort as directed by your health care provider. The use of long-term narcotics is not recommended.  Lie down in a dark, quiet room when you have a migraine.  Keep a journal to find out what may trigger your migraine headaches. For example, write down:  What you eat and drink.  How much sleep you get.  Any change to your diet or medicines.  Limit alcohol consumption.  Quit smoking if you smoke.  Get 7-9 hours of sleep, or as recommended by your health care provider.  Limit stress.  Keep lights dim if bright lights bother you and make your migraines worse. SEEK IMMEDIATE MEDICAL CARE IF:   Your migraine becomes severe.  You have a fever.  You have a stiff neck.  You have vision loss.  You have muscular weakness or loss of muscle control.  You start losing your balance or have trouble walking.  You feel faint or pass out.  You have severe symptoms that are different from your first symptoms. MAKE SURE YOU:   Understand these instructions.  Will watch  your condition.  Will get help right away if you are not doing well or get worse. Document Released: 01/11/2005 Document Revised: 05/28/2013 Document Reviewed: 09/18/2012 [facility] Patient Information 2015 [place], [place]. This information is not intended to replace advice given to you by your health care provider. Make sure you discuss any  questions you have with your health care provider.

## 2014-05-14 NOTE — Telephone Encounter (Signed)
I have tried to reach patient x3  Did not get a answer I did leave message on voicemail for her to return my call

## 2014-05-15 ENCOUNTER — Telehealth: Payer: Self-pay | Admitting: Neurology

## 2014-05-15 NOTE — Telephone Encounter (Signed)
I spoke with patient she states that the weakness she had on Monday and the weekend was on the Left side of body . She states she took (1) topamax on 05/03/14 and she was not able to eat anything she has not taken any since then she feels that the topamax is the reason she was unable to eat . I explained to her that sometimes it can take a few days before medications get into the system and start to work . It is not likely that after 1 pill you would not be able to eat. She stated the ED advised her to come see you in 4 to 5 days  She says she is only having a small amount of burning . Please advise

## 2014-05-15 NOTE — Telephone Encounter (Signed)
We know she has complicated migraines, so I don't think follow up right away is necessary.  If she is adamant about not taking topamax, then we can try nortriptyline 10mg  at bedtime.  It is an antidepressant used for migraine prevention.  Like all preventative medications, she needs to give each dose 4 weeks before we make changes.  She should realize that I wouldn't stop a medication just because she experiences a side effect for the first couple of times of taking it.  Sometimes her body needs to get used to it first.

## 2014-05-15 NOTE — Telephone Encounter (Signed)
Pt is returning your call from Monday please call her back at [phone] dana

## 2014-05-16 ENCOUNTER — Telehealth: Payer: Self-pay | Admitting: Neurology

## 2014-05-16 NOTE — Telephone Encounter (Signed)
Dr Tomi Likens spoke with patient  See below note

## 2014-05-16 NOTE — Telephone Encounter (Signed)
I called and spoke with [facility] regarding the episode that brought her to the ED.  This time, she experienced weakness involving the arm and leg but on the LEFT side.  It was also associated with pain in those limbs, as well as the habitual headache.  Given that these focal symptoms are not in the distribution of the left carotid artery, and associated with the habitual headache, I am certain these are migraine-related and that the first event was not symptomatic from the carotid artery.  Therefore, I think we can cancel the vascular consult.  She may also stop the statin but I recommended to continue ASA 81mg  daily for now.  She just started the topiramate, so we will see how she does over the next 4 weeks. She will also keep a diary.  I answered all questions to the best of my ability.

## 2014-05-18 ENCOUNTER — Encounter (HOSPITAL_BASED_OUTPATIENT_CLINIC_OR_DEPARTMENT_OTHER): Payer: Self-pay | Admitting: Emergency Medicine

## 2014-05-18 ENCOUNTER — Emergency Department (HOSPITAL_BASED_OUTPATIENT_CLINIC_OR_DEPARTMENT_OTHER)
Admission: EM | Admit: 2014-05-18 | Discharge: 2014-05-18 | Disposition: A | Discharge status: Home / Self Care | Payer: Federal, State, Local not specified - PPO | Attending: Emergency Medicine | Admitting: Emergency Medicine

## 2014-05-18 ENCOUNTER — Emergency Department (HOSPITAL_BASED_OUTPATIENT_CLINIC_OR_DEPARTMENT_OTHER): Payer: Federal, State, Local not specified - PPO

## 2014-05-18 DIAGNOSIS — Z3202 Encounter for pregnancy test, result negative: Secondary | ICD-10-CM | POA: Insufficient documentation

## 2014-05-18 DIAGNOSIS — G43909 Migraine, unspecified, not intractable, without status migrainosus: Secondary | ICD-10-CM | POA: Diagnosis not present

## 2014-05-18 DIAGNOSIS — Z79899 Other long term (current) drug therapy: Secondary | ICD-10-CM | POA: Insufficient documentation

## 2014-05-18 DIAGNOSIS — Z7982 Long term (current) use of aspirin: Secondary | ICD-10-CM | POA: Diagnosis not present

## 2014-05-18 DIAGNOSIS — T426X5A Adverse effect of other antiepileptic and sedative-hypnotic drugs, initial encounter: Secondary | ICD-10-CM | POA: Insufficient documentation

## 2014-05-18 DIAGNOSIS — R42 Dizziness and giddiness: Secondary | ICD-10-CM

## 2014-05-18 DIAGNOSIS — T50905A Adverse effect of unspecified drugs, medicaments and biological substances, initial encounter: Secondary | ICD-10-CM

## 2014-05-18 DIAGNOSIS — Z8639 Personal history of other endocrine, nutritional and metabolic disease: Secondary | ICD-10-CM | POA: Insufficient documentation

## 2014-05-18 HISTORY — DX: Pure hypercholesterolemia, unspecified: E78.00

## 2014-05-18 LAB — CBC WITH DIFFERENTIAL/PLATELET
Basophils Absolute: 0 10*3/uL (ref 0.0–0.1)
Basophils Relative: 1 % (ref 0–1)
Eosinophils Absolute: 0 10*3/uL (ref 0.0–0.7)
Eosinophils Relative: 1 % (ref 0–5)
HCT: 39.9 % (ref 36.0–46.0)
Hemoglobin: 13.4 g/dL (ref 12.0–15.0)
LYMPHS PCT: 34 % (ref 12–46)
Lymphs Abs: 1.5 10*3/uL (ref 0.7–4.0)
MCH: 28.5 pg (ref 26.0–34.0)
MCHC: 33.6 g/dL (ref 30.0–36.0)
MCV: 84.7 fL (ref 78.0–100.0)
Monocytes Absolute: 0.6 10*3/uL (ref 0.1–1.0)
Monocytes Relative: 13 % — ABNORMAL HIGH (ref 3–12)
NEUTROS ABS: 2.3 10*3/uL (ref 1.7–7.7)
NEUTROS PCT: 51 % (ref 43–77)
PLATELETS: 290 10*3/uL (ref 150–400)
RBC: 4.71 MIL/uL (ref 3.87–5.11)
RDW: 12 % (ref 11.5–15.5)
WBC: 4.4 10*3/uL (ref 4.0–10.5)

## 2014-05-18 LAB — BASIC METABOLIC PANEL
ANION GAP: 5 (ref 5–15)
BUN: 12 mg/dL (ref 6–23)
CALCIUM: 8.9 mg/dL (ref 8.4–10.5)
CO2: 26 mmol/L (ref 19–32)
CREATININE: 0.97 mg/dL (ref 0.50–1.10)
Chloride: 105 mmol/L (ref 96–112)
GFR calc non Af Amer: 70 mL/min — ABNORMAL LOW (ref >90)
GFR, EST AFRICAN AMERICAN: 81 mL/min — AB (ref >90)
Glucose, Bld: 106 mg/dL — ABNORMAL HIGH (ref 70–99)
Potassium: 3.4 mmol/L — ABNORMAL LOW (ref 3.5–5.1)
Sodium: 136 mmol/L (ref 135–145)

## 2014-05-18 LAB — PREGNANCY, URINE: Preg Test, Ur: NEGATIVE

## 2014-05-18 LAB — TROPONIN I

## 2014-05-18 NOTE — ED Notes (Signed)
Pt ambulatory to bathroom,  States dizziness not as bad as on arrival

## 2014-05-18 NOTE — ED Notes (Signed)
Pt reports developing intermittent dizziness yesterday , where she is still but enviroment is spining around her, tonight at work dizziness episodes increased tonight, at this point it is better, but she feels weak

## 2014-05-18 NOTE — ED Notes (Signed)
Pt with history of migraines, last migraine was on Monday. She saw neurologist on wed, was started on topamax for migraines, dizziness and weakness started Friday am after two days of medication.

## 2014-05-18 NOTE — ED Notes (Signed)
Pt reports nausea with dizziness, but has not vomited

## 2014-05-18 NOTE — ED Provider Notes (Signed)
CSN: [id number]     Arrival date & time 05/18/14  0043 History   First MD Initiated Contact with Patient 05/18/14 [phone]     Chief Complaint  Patient presents with  . Dizziness     (Consider location/radiation/quality/duration/timing/severity/associated sxs/prior Treatment) Patient is a 44 y.o. female presenting with dizziness. The history is provided by the patient.  Dizziness Quality:  Lightheadedness Severity:  Moderate Onset quality:  Gradual Duration:  12 hours Timing:  Constant Progression:  Unchanged Chronicity:  New Context: medication   Context: not with loss of consciousness and not when standing up   Context comment:  Started topamax for migraines this week Relieved by:  Nothing Worsened by:  Nothing Ineffective treatments:  None tried Associated symptoms: no chest pain, no headaches, no palpitations, no shortness of breath, no syncope, no tinnitus, no vomiting and no weakness   Risk factors: new medications     Past Medical History  Diagnosis Date  . Hyperthyroidism   . Migraines   . Hypercholesteremia    History reviewed. No pertinent past surgical history. Family History  Problem Relation Age of Onset  . Thyroid disease Neg Hx   . Diabetes Mother   . Hypertension Mother   . Colon cancer Mother   . Breast cancer Mother   . Prostate cancer Father    History  Substance Use Topics  . Smoking status: Never Smoker   . Smokeless tobacco: Never Used  . Alcohol Use: No   OB History    No data available     Review of Systems  Constitutional: Negative for fever.  HENT: Negative for tinnitus.   Respiratory: Negative for shortness of breath.   Cardiovascular: Negative for chest pain, palpitations, leg swelling and syncope.  Gastrointestinal: Negative for vomiting.  Neurological: Positive for dizziness and light-headedness. Negative for tremors, seizures, syncope, facial asymmetry, speech difficulty, weakness, numbness and headaches.  All other systems  reviewed and are negative.     Allergies  Review of patient's allergies indicates no known allergies.  Home Medications   Prior to Admission medications   Medication Sig Start Date End Date Taking? Authorizing Provider  aspirin 325 MG tablet Take 81 mg by mouth daily.    Yes Historical Provider, MD  Biotin w/ Vitamins C & E 1250-7.5-7.5 MCG-MG-UNT CHEW Chew 2 tablets by mouth daily.   Yes Historical Provider, MD  cholecalciferol (VITAMIN D) 1000 UNITS tablet Take 1,000 Units by mouth daily.   Yes Historical Provider, MD  methimazole (TAPAZOLE) 5 MG tablet Take 5 mg by mouth every Monday, Wednesday, and Friday.    Yes Historical Provider, MD  topiramate (TOPAMAX) 25 MG tablet Take 1 tablet (25 mg total) by mouth at bedtime. 05/03/14  Yes Adam Telford Nab, DO  Diclofenac Potassium 50 MG PACK Take 50mg  at earliest onset of headache.  Take on empty stomach if possible. 05/03/14   Pieter Partridge, DO  naproxen (NAPROSYN) 500 MG tablet Take 1 tablet (500 mg total) by mouth 2 (two) times daily as needed. Patient taking differently: Take 500 mg by mouth 2 (two) times daily as needed for mild pain or moderate pain (pain).  04/03/14   Golden Circle, FNP   BP 127/84 mmHg  Pulse 73  Temp(Src) 98 F (36.7 C) (Oral)  Resp 18  Ht 5\' 7"  (1.702 m)  Wt 168 lb (76.204 kg)  BMI 26.31 kg/m2  SpO2 100%  LMP 05/03/2014 Physical Exam  Constitutional: She is oriented to person, place, and time.  She appears well-developed and well-nourished. No distress.  HENT:  Head: Normocephalic and atraumatic.  Mouth/Throat: Oropharynx is clear and moist.  Eyes: Conjunctivae and EOM are normal. Pupils are equal, round, and reactive to light.  Neck: Normal range of motion. Neck supple.  Cardiovascular: Normal rate and regular rhythm.   Pulmonary/Chest: Effort normal and breath sounds normal. No respiratory distress. She has no wheezes. She has no rales.  Abdominal: Soft. Bowel sounds are normal. There is no tenderness. There  is no rebound and no guarding.  Musculoskeletal: Normal range of motion. She exhibits no edema or tenderness.  Neurological: She is alert and oriented to person, place, and time. She has normal reflexes. She displays normal reflexes. No cranial nerve deficit. She exhibits normal muscle tone. Coordination normal.  Skin: Skin is warm and dry.  Psychiatric: She has a normal mood and affect.    ED Course  Procedures (including critical care time) Labs Review Labs Reviewed  CBC WITH DIFFERENTIAL/PLATELET - Abnormal; Notable for the following:    Monocytes Relative 13 (*)    All other components within normal limits  BASIC METABOLIC PANEL - Abnormal; Notable for the following:    Potassium 3.4 (*)    Glucose, Bld 106 (*)    GFR calc non Af Amer 70 (*)    GFR calc Af Amer 81 (*)    All other components within normal limits  TROPONIN I  PREGNANCY, URINE    Imaging Review Dg Chest 2 View  05/18/2014   CLINICAL DATA:  Acute onset of generalized weakness, nausea and dizziness. Initial encounter.  EXAM: CHEST  2 VIEW  COMPARISON:  None.  FINDINGS: The lungs are well-aerated and clear. There is no evidence of focal opacification, pleural effusion or pneumothorax.  The heart is normal in size; the mediastinal contour is within normal limits. No acute osseous abnormalities are seen.  IMPRESSION: No acute cardiopulmonary process seen.   Electronically Signed   By: Garald Balding M.D.   On: 05/18/2014 02:39     EKG Interpretation None      MDM   Final diagnoses:  Lightheaded  Medication adverse effect, initial encounter     EKG Interpretation  Date/Time:  Saturday Joenathan Sakuma 23 2016 02:08:14 EDT Ventricular Rate:  72 PR Interval:  158 QRS Duration: 72 QT Interval:  390 QTC Calculation: 427 R Axis:   48 Text Interpretation:  Normal sinus rhythm with sinus arrhythmia Confirmed by [facility]  MD, Melisssa Donner ([id number]) on 05/18/2014 4:08:48 AM       Highly doubt ACS as symptoms ongoing with  negative EKG and troponin.  No neuro deficits.  Suspect medication effect of topamax.  Call your doctor to discuss this medication in am.     Chima Astorino, MD 05/18/14 [phone]

## 2014-05-18 NOTE — ED Notes (Signed)
C/o weakness and dizziness onset yesterday  Has had some nausea,  Had sob x 2 minutes  No distress noted

## 2014-05-20 ENCOUNTER — Telehealth: Payer: Self-pay | Admitting: Neurology

## 2014-05-20 NOTE — Telephone Encounter (Signed)
I don't think it is related to the topamax.  However, if she wants to switch to another agent, we can try nortriptyline 10mg  at bedtime.  Must give it 4 weeks to see if it is effective.  She can contact us then and we can adjust dose if needed.  As far as the note is concerned, it should really be coming from the ED since those were the providers who treated her.

## 2014-05-20 NOTE — Telephone Encounter (Signed)
Patient does not want to take any other medication she is not able to work when she takes medication . She took (2) Topamax and felt like she was very dizzy and could not breath good . She has not had a headache since last week 05/15/14. She would like to just do aleve 500 mg and a cup of coffee for the headaches  She says that helps the most .

## 2014-05-20 NOTE — Telephone Encounter (Signed)
Patient is asking for a work excuse for the weekend she went ED she says it is a reaction to Topamax  She is having LT ARM LT Leg pain weakness it is  really keeping her awake at night . I advised her she should get work note from  ED as she was not seen in the office over the weekend . Please advise on medication you want her to take . She is no longer taking  Topamax

## 2014-05-20 NOTE — Telephone Encounter (Signed)
Side effect from Topamax went to ER Friday night, please call back at [phone]. Will need doctor's note for work over the w/e. / Venida Jarvis

## 2014-05-23 ENCOUNTER — Other Ambulatory Visit: Payer: Self-pay

## 2014-05-23 DIAGNOSIS — Z1231 Encounter for screening mammogram for malignant neoplasm of breast: Secondary | ICD-10-CM

## 2014-05-27 ENCOUNTER — Other Ambulatory Visit (INDEPENDENT_AMBULATORY_CARE_PROVIDER_SITE_OTHER): Payer: Federal, State, Local not specified - PPO

## 2014-05-27 ENCOUNTER — Ambulatory Visit (INDEPENDENT_AMBULATORY_CARE_PROVIDER_SITE_OTHER): Payer: Federal, State, Local not specified - PPO | Admitting: Family

## 2014-05-27 ENCOUNTER — Encounter: Payer: Self-pay | Admitting: Family

## 2014-05-27 VITALS — BP 110/82 | HR 75 | Temp 98.3°F | Resp 18 | Ht 67.0 in | Wt 156.8 lb

## 2014-05-27 DIAGNOSIS — Z23 Encounter for immunization: Secondary | ICD-10-CM

## 2014-05-27 DIAGNOSIS — Z0000 Encounter for general adult medical examination without abnormal findings: Secondary | ICD-10-CM | POA: Diagnosis not present

## 2014-05-27 LAB — LIPID PANEL
CHOL/HDL RATIO: 4
CHOLESTEROL: 111 mg/dL (ref 0–200)
HDL: 27.7 mg/dL — ABNORMAL LOW (ref >39.00)
LDL Cholesterol: 75 mg/dL (ref 0–99)
NONHDL: 83.3
Triglycerides: 41 mg/dL (ref 0.0–149.0)
VLDL: 8.2 mg/dL (ref 0.0–40.0)

## 2014-05-27 LAB — BASIC METABOLIC PANEL
BUN: 11 mg/dL (ref 6–23)
CHLORIDE: 105 meq/L (ref 96–112)
CO2: 27 mEq/L (ref 19–32)
Calcium: 9 mg/dL (ref 8.4–10.5)
Creatinine, Ser: 0.99 mg/dL (ref 0.40–1.20)
GFR: 78.05 mL/min (ref >60.00)
GLUCOSE: 89 mg/dL (ref 70–99)
Potassium: 4 mEq/L (ref 3.5–5.1)
SODIUM: 138 meq/L (ref 135–145)

## 2014-05-27 LAB — HEMOGLOBIN A1C: Hgb A1c MFr Bld: 5.4 % (ref 4.6–6.5)

## 2014-05-27 LAB — TSH: TSH: 2.95 u[IU]/mL (ref 0.35–4.50)

## 2014-05-27 NOTE — Progress Notes (Signed)
Pre visit review using our clinic review tool, if applicable. No additional management support is needed unless otherwise documented below in the visit note. 

## 2014-05-27 NOTE — Patient Instructions (Addendum)
Thank you for choosing [facility].  Summary/Instructions:  Please stop by the lab on the basement level of the building for your blood work. Your results will be released to [facility] (or called to you) after review, usually within 72 hours after test completion. If any changes need to be made, you will be notified at that same time.  If your symptoms worsen or fail to improve, please contact our office for further instruction, or in case of emergency go directly to the emergency room at the closest medical facility.    Health Maintenance Adopting a healthy lifestyle and getting preventive care can go a long way to promote health and wellness. Talk with your health care provider about what schedule of regular examinations is right for you. This is a good chance for you to check in with your provider about disease prevention and staying healthy. In between checkups, there are plenty of things you can do on your own. Experts have done a lot of research about which lifestyle changes and preventive measures are most likely to keep you healthy. Ask your health care provider for more information. WEIGHT AND DIET  Eat a healthy diet  Be sure to include plenty of vegetables, fruits, low-fat dairy products, and lean protein.  Do not eat a lot of foods high in solid fats, added sugars, or salt.  Get regular exercise. This is one of the most important things you can do for your health.  Most adults should exercise for at least 150 minutes each week. The exercise should increase your heart rate and make you sweat (moderate-intensity exercise).  Most adults should also do strengthening exercises at least twice a week. This is in addition to the moderate-intensity exercise.  Maintain a healthy weight  Body mass index (BMI) is a measurement that can be used to identify possible weight problems. It estimates body fat based on height and weight. Your health care provider can help determine your BMI  and help you achieve or maintain a healthy weight.  For females 27 years of age and older:   A BMI below 18.5 is considered underweight.  A BMI of 18.5 to 24.9 is normal.  A BMI of 25 to 29.9 is considered overweight.  A BMI of 30 and above is considered obese.  Watch levels of cholesterol and blood lipids  You should start having your blood tested for lipids and cholesterol at 45 years of age, then have this test every 5 years.  You may need to have your cholesterol levels checked more often if:  Your lipid or cholesterol levels are high.  You are older than 45 years of age.  You are at high risk for heart disease.  CANCER SCREENING   Lung Cancer  Lung cancer screening is recommended for adults 64-54 years old who are at high risk for lung cancer because of a history of smoking.  A yearly low-dose CT scan of the lungs is recommended for people who:  Currently smoke.  Have quit within the past 15 years.  Have at least a 30-pack-year history of smoking. A pack year is smoking an average of one pack of cigarettes a day for 1 year.  Yearly screening should continue until it has been 15 years since you quit.  Yearly screening should stop if you develop a health problem that would prevent you from having lung cancer treatment.  Breast Cancer  Practice breast self-awareness. This means understanding how your breasts normally appear and feel.  It  also means doing regular breast self-exams. Let your health care provider know about any changes, no matter how small.  If you are in your 20s or 30s, you should have a clinical breast exam (CBE) by a health care provider every 1-3 years as part of a regular health exam.  If you are 40 or older, have a CBE every year. Also consider having a breast X-ray (mammogram) every year.  If you have a family history of breast cancer, talk to your health care provider about genetic screening.  If you are at high risk for breast cancer,  talk to your health care provider about having an MRI and a mammogram every year.  Breast cancer gene (BRCA) assessment is recommended for women who have family members with BRCA-related cancers. BRCA-related cancers include:  Breast.  Ovarian.  Tubal.  Peritoneal cancers.  Results of the assessment will determine the need for genetic counseling and BRCA1 and BRCA2 testing. Cervical Cancer Routine pelvic examinations to screen for cervical cancer are no longer recommended for nonpregnant women who are considered low risk for cancer of the pelvic organs (ovaries, uterus, and vagina) and who do not have symptoms. A pelvic examination may be necessary if you have symptoms including those associated with pelvic infections. Ask your health care provider if a screening pelvic exam is right for you.   The Pap test is the screening test for cervical cancer for women who are considered at risk.  If you had a hysterectomy for a problem that was not cancer or a condition that could lead to cancer, then you no longer need Pap tests.  If you are older than 65 years, and you have had normal Pap tests for the past 10 years, you no longer need to have Pap tests.  If you have had past treatment for cervical cancer or a condition that could lead to cancer, you need Pap tests and screening for cancer for at least 20 years after your treatment.  If you no longer get a Pap test, assess your risk factors if they change (such as having a new sexual partner). This can affect whether you should start being screened again.  Some women have medical problems that increase their chance of getting cervical cancer. If this is the case for you, your health care provider may recommend more frequent screening and Pap tests.  The human papillomavirus (HPV) test is another test that may be used for cervical cancer screening. The HPV test looks for the virus that can cause cell changes in the cervix. The cells collected  during the Pap test can be tested for HPV.  The HPV test can be used to screen women 30 years of age and older. Getting tested for HPV can extend the interval between normal Pap tests from three to five years.  An HPV test also should be used to screen women of any age who have unclear Pap test results.  After 45 years of age, women should have HPV testing as often as Pap tests.  Colorectal Cancer  This type of cancer can be detected and often prevented.  Routine colorectal cancer screening usually begins at 45 years of age and continues through 45 years of age.  Your health care provider may recommend screening at an earlier age if you have risk factors for colon cancer.  Your health care provider may also recommend using home test kits to check for hidden blood in the stool.  A small camera at the end   of a tube can be used to examine your colon directly (sigmoidoscopy or colonoscopy). This is done to check for the earliest forms of colorectal cancer.  Routine screening usually begins at age 50.  Direct examination of the colon should be repeated every 5-10 years through 45 years of age. However, you may need to be screened more often if early forms of precancerous polyps or small growths are found. Skin Cancer  Check your skin from head to toe regularly.  Tell your health care provider about any new moles or changes in moles, especially if there is a change in a mole's shape or color.  Also tell your health care provider if you have a mole that is larger than the size of a pencil eraser.  Always use sunscreen. Apply sunscreen liberally and repeatedly throughout the day.  Protect yourself by wearing long sleeves, pants, a wide-brimmed hat, and sunglasses whenever you are outside. HEART DISEASE, DIABETES, AND HIGH BLOOD PRESSURE   Have your blood pressure checked at least every 1-2 years. High blood pressure causes heart disease and increases the risk of stroke.  If you are  between 55 years and 79 years old, ask your health care provider if you should take aspirin to prevent strokes.  Have regular diabetes screenings. This involves taking a blood sample to check your fasting blood sugar level.  If you are at a normal weight and have a low risk for diabetes, have this test once every three years after 45 years of age.  If you are overweight and have a high risk for diabetes, consider being tested at a younger age or more often. PREVENTING INFECTION  Hepatitis B  If you have a higher risk for hepatitis B, you should be screened for this virus. You are considered at high risk for hepatitis B if:  You were born in a country where hepatitis B is common. Ask your health care provider which countries are considered high risk.  Your parents were born in a high-risk country, and you have not been immunized against hepatitis B (hepatitis B vaccine).  You have HIV or AIDS.  You use needles to inject street drugs.  You live with someone who has hepatitis B.  You have had sex with someone who has hepatitis B.  You get hemodialysis treatment.  You take certain medicines for conditions, including cancer, organ transplantation, and autoimmune conditions. Hepatitis C  Blood testing is recommended for:  Everyone born from 1945 through 1965.  Anyone with known risk factors for hepatitis C. Sexually transmitted infections (STIs)  You should be screened for sexually transmitted infections (STIs) including gonorrhea and chlamydia if:  You are sexually active and are younger than 45 years of age.  You are older than 45 years of age and your health care provider tells you that you are at risk for this type of infection.  Your sexual activity has changed since you were last screened and you are at an increased risk for chlamydia or gonorrhea. Ask your health care provider if you are at risk.  If you do not have HIV, but are at risk, it may be recommended that you  take a prescription medicine daily to prevent HIV infection. This is called pre-exposure prophylaxis (PrEP). You are considered at risk if:  You are sexually active and do not regularly use condoms or know the HIV status of your partner(s).  You take drugs by injection.  You are sexually active with a partner who has HIV.   Talk with your health care provider about whether you are at high risk of being infected with HIV. If you choose to begin PrEP, you should first be tested for HIV. You should then be tested every 3 months for as long as you are taking PrEP.  PREGNANCY   If you are premenopausal and you may become pregnant, ask your health care provider about preconception counseling.  If you may become pregnant, take 400 to 800 micrograms (mcg) of folic acid every day.  If you want to prevent pregnancy, talk to your health care provider about birth control (contraception). OSTEOPOROSIS AND MENOPAUSE   Osteoporosis is a disease in which the bones lose minerals and strength with aging. This can result in serious bone fractures. Your risk for osteoporosis can be identified using a bone density scan.  If you are 65 years of age or older, or if you are at risk for osteoporosis and fractures, ask your health care provider if you should be screened.  Ask your health care provider whether you should take a calcium or vitamin D supplement to lower your risk for osteoporosis.  Menopause may have certain physical symptoms and risks.  Hormone replacement therapy may reduce some of these symptoms and risks. Talk to your health care provider about whether hormone replacement therapy is right for you.  HOME CARE INSTRUCTIONS   Schedule regular health, dental, and eye exams.  Stay current with your immunizations.   Do not use any tobacco products including cigarettes, chewing tobacco, or electronic cigarettes.  If you are pregnant, do not drink alcohol.  If you are breastfeeding, limit how  much and how often you drink alcohol.  Limit alcohol intake to no more than 1 drink per day for nonpregnant women. One drink equals 12 ounces of beer, 5 ounces of wine, or 1 ounces of hard liquor.  Do not use street drugs.  Do not share needles.  Ask your health care provider for help if you need support or information about quitting drugs.  Tell your health care provider if you often feel depressed.  Tell your health care provider if you have ever been abused or do not feel safe at home. Document Released: 07/27/2010 Document Revised: 05/28/2013 Document Reviewed: 12/13/2012 Exit[employer] Patient Information 2015 [employer]. This information is not intended to replace advice given to you by your health care provider. Make sure you discuss any questions you have with your health care provider.  

## 2014-05-27 NOTE — Assessment & Plan Note (Addendum)
1) Anticipatory Guidance: Discussed importance of wearing a seatbelt while driving and not texting while driving; changing batteries in smoke detector at least once annually; wearing suntan lotion when outside; eating a balanced and moderate diet; getting physical activity at least 30 minutes per day.  2) Immunizations / Screenings / Labs:  Patient is due for a tetanus shot. Tetanus administered in the office today. All other immunizations are up-to-date per recommendations. Patient is due for a dental exam which she will schedule independently. All other screenings are up-to-date per recommendations. Obtain BMET, Lipid profile, Estrogen, progesterone, B12 and TSH.    Overall well exam. Patient has minimal risk factors for cardiovascular disease at this time. Continue current healthy lifestyle behavior and choices. Goes to increase physical activity as able. Patient does describe some weakness and numbness. Will check her TSH, B12, and A1c to rule out metabolic causes. Follow-up prevention exam in 1 year. Follow-up office visit pending lab work has needed.

## 2014-05-27 NOTE — Addendum Note (Signed)
Addended by: Delice Bison E on: 05/27/2014 10:29 AM   Modules accepted: Orders

## 2014-05-27 NOTE — Progress Notes (Signed)
Subjective:    Patient ID: Christina [name]. Christina [name], female    DOB: 07-Jul-1970, 44 y.o.   MRN: [id number]  Chief Complaint  Patient presents with  . CPE    Fasting    HPI:  Christina [name] is a 44 y.o. female who presents today for an annual wellness visit.   1) Health Maintenance -   Diet - Averages about 2-3 meals per day with snacks; fruits, vegetables, whole grains, lean proteins;  Caffeine intake 2-3 cups per day  Exercise - Recently started brisk walking; but has had some tingling which is limiting it.    2) Preventative Exams / Immunizations:  Dental -- Due for exam  Vision -- Up to date  Health Maintenance  Topic Date Due  . HIV Screening  07/10/1984  . TETANUS/TDAP  07/10/1988  . INFLUENZA VACCINE  04/03/2015 (Originally 08/26/2014)  . PAP SMEAR  06/05/2016   Tetanus shot today   There is no immunization history on file for this patient.   Review of Systems  Constitutional: Denies fever, chills, fatigue, or significant weight gain/loss. HENT: Head: Denies headache or neck pain Ears: Denies changes in hearing, ringing in ears, earache, drainage Nose: Denies discharge, stuffiness, itching, nosebleed, sinus pain Throat: Denies sore throat, hoarseness, dry mouth, sores, thrush Eyes: Denies loss/changes in vision, pain, redness, blurry/double vision, flashing lights Cardiovascular: Denies chest pain/discomfort, tightness, palpitations, shortness of breath with activity, difficulty lying down, swelling, sudden awakening with shortness of breath Occasional heart palpitation.  Respiratory: Denies shortness of breath, cough, sputum production, wheezing Gastrointestinal: Denies dysphasia, heartburn, change in appetite, nausea, change in bowel habits, rectal bleeding, constipation, diarrhea, yellow skin or eyes Genitourinary: Denies frequency, urgency, burning/pain, blood in urine, incontinence, change in urinary strength. Musculoskeletal: Denies muscle/joint pain, stiffness,  back pain, redness or swelling of joints, trauma Skin: Denies rashes, lumps, itching, dryness, color changes, or hair/nail changes Neurological: Denies dizziness, fainting, seizures, weakness, numbness, tremor Some weakness in her lower left extremity with occasional tingling in her toes.  Psychiatric - Denies nervousness, stress, depression or memory loss Endocrine: Denies heat or cold intolerance, sweating, frequent urination, excessive thirst, changes in appetite Hematologic: Denies ease of bruising or bleeding     Objective:    BP 110/82 mmHg  Pulse 75  Temp(Src) 98.3 F (36.8 C) (Oral)  Resp 18  Ht 5\' 7"  (1.702 m)  Wt 156 lb 12.8 oz (71.124 kg)  BMI 24.55 kg/m2  SpO2 99%  LMP 05/03/2014 Nursing note and vital signs reviewed.  Physical Exam  Constitutional: She is oriented to person, place, and time. She appears well-developed and well-nourished.  HENT:  Head: Normocephalic.  Right Ear: Hearing, tympanic membrane, external ear and ear canal normal.  Left Ear: Hearing, tympanic membrane, external ear and ear canal normal.  Nose: Nose normal.  Mouth/Throat: Uvula is midline, oropharynx is clear and moist and mucous membranes are normal.  Eyes: Conjunctivae and EOM are normal. Pupils are equal, round, and reactive to light.  Neck: Neck supple. No JVD present. No tracheal deviation present. No thyromegaly present.  Cardiovascular: Normal rate, regular rhythm, normal heart sounds and intact distal pulses.   Pulmonary/Chest: Effort normal and breath sounds normal.  Abdominal: Soft. Bowel sounds are normal. She exhibits no distension and no mass. There is no tenderness. There is no rebound and no guarding.  Musculoskeletal: Normal range of motion. She exhibits no edema or tenderness.       Left upper leg: She exhibits no tenderness, no bony  tenderness, no swelling, no edema, no deformity and no laceration.       Left lower leg: She exhibits no tenderness, no bony tenderness, no  swelling, no edema, no deformity and no laceration.  Left lower extremity strength is normal  Lymphadenopathy:    She has no cervical adenopathy.  Neurological: She is alert and oriented to person, place, and time. She has normal reflexes. No cranial nerve deficit. She exhibits normal muscle tone. Coordination normal.  Skin: Skin is warm and dry.  Psychiatric: She has a normal mood and affect. Her behavior is normal. Judgment and thought content normal.       Assessment & Plan:

## 2014-05-28 ENCOUNTER — Telehealth: Payer: Self-pay | Admitting: Family

## 2014-05-28 NOTE — Telephone Encounter (Signed)
Please inform the patient that her lab results indicate that her kidney function, electrolytes, thyroid function, and A1c are normal. The A1c was 5.4 which is normal and indicates your blood sugar is good. Lastly your cholesterol for the most part looked good, with the exception of your good cholesterol. Your good cholesterol or HDL was low which increases the risk for cardiovascular disease. The goal of this number is >39 and yours was 27. To increase your HDL we recommend changes in diet including adding sources of omega-3 fatty acids including fish, walnuts, flaxseed, olive oil and/or a fish oil supplement (recommend Nature [employer]). We can plan to recheck your cholesterol in about 6 months.

## 2014-05-29 ENCOUNTER — Other Ambulatory Visit (INDEPENDENT_AMBULATORY_CARE_PROVIDER_SITE_OTHER): Payer: Federal, State, Local not specified - PPO

## 2014-05-29 ENCOUNTER — Other Ambulatory Visit: Payer: Self-pay | Admitting: Family

## 2014-05-29 DIAGNOSIS — Z0000 Encounter for general adult medical examination without abnormal findings: Secondary | ICD-10-CM

## 2014-05-29 LAB — B12 AND FOLATE PANEL
Folate: 14.6 ng/mL (ref >5.9)
VITAMIN B 12: 220 pg/mL (ref 211–911)

## 2014-05-29 LAB — VITAMIN B12: VITAMIN B 12: 220 pg/mL (ref 211–911)

## 2014-05-29 NOTE — Telephone Encounter (Signed)
Pt aware.

## 2014-05-29 NOTE — Addendum Note (Signed)
Addended by: Delice Bison E on: 05/29/2014 02:35 PM   Modules accepted: Orders

## 2014-05-29 NOTE — Telephone Encounter (Signed)
If she is wishing to have her B12 levels she will have to stop by the lab to have them drawn. I placed a lab order reflecting this.

## 2014-05-29 NOTE — Telephone Encounter (Signed)
Pt aware of results. Will send results in the mail per request. Also pt wanted to know about her B12 levels and looking back it says it wasn't collected. Should the pt come back in to get that done. She states that was the main thing she wanted the results to. Please advise

## 2014-05-30 LAB — PROGESTERONE: PROGESTERONE: 0.4 ng/mL

## 2014-06-03 ENCOUNTER — Telehealth: Payer: Self-pay | Admitting: Family

## 2014-06-03 NOTE — Telephone Encounter (Signed)
Please inform patient that her B12, Folate and progesterone is normal. We do not have the results back from her estrogen.

## 2014-06-03 NOTE — Telephone Encounter (Signed)
LVM letting pt know what the results were.

## 2014-06-03 NOTE — Telephone Encounter (Signed)
Patient is requesting call back with lab results.  °

## 2014-06-04 ENCOUNTER — Telehealth: Payer: Self-pay | Admitting: Family

## 2014-06-04 LAB — ESTROGENS, TOTAL: Estrogen: 197 pg/mL

## 2014-06-04 NOTE — Telephone Encounter (Signed)
Please inform patient that her estrogen levels are normal as well.

## 2014-06-04 NOTE — Telephone Encounter (Signed)
Pt aware of results 

## 2014-06-10 ENCOUNTER — Ambulatory Visit
Admission: RE | Admit: 2014-06-10 | Discharge: 2014-06-10 | Disposition: A | Discharge status: Home / Self Care | Payer: Federal, State, Local not specified - PPO | Source: Ambulatory Visit

## 2014-06-10 ENCOUNTER — Ambulatory Visit: Payer: Federal, State, Local not specified - PPO

## 2014-06-10 DIAGNOSIS — Z1231 Encounter for screening mammogram for malignant neoplasm of breast: Secondary | ICD-10-CM

## 2014-06-13 ENCOUNTER — Telehealth: Payer: Self-pay | Admitting: Family

## 2014-06-13 ENCOUNTER — Telehealth: Payer: Self-pay | Admitting: Internal Medicine

## 2014-06-13 NOTE — Telephone Encounter (Signed)
Patient Name: Christina [name] DOB: 10/16/1970 Initial Comment Caller states she has chest pressure and tightness, appt tomorrow, xfer from office for triage Nurse Assessment Nurse: Ronnald Ramp, RN, Miranda Date/Time (Eastern Time): 06/13/2014 2:26:52 PM Confirm and document reason for call. If symptomatic, describe symptoms. ---Caller states she has pain in her right upper chest for about 1 week. Gets worse when lifting or using her arms. Has the patient traveled out of the country within the last 30 days? ---Not Applicable Does the patient require triage? ---Yes Related visit to physician within the last 2 weeks? ---No Does the PT have any chronic conditions? (i.e. diabetes, asthma, etc.) ---Yes List chronic conditions. ---Thyroid, carotid blockage, Migraine Did the patient indicate they were pregnant? ---No Guidelines Guideline Title Affirmed Question Affirmed Notes Chest Pain [1] Chest pain(s) lasting a few seconds AND [2] persists > 3 days Final Disposition User See PCP When Office is Open (within 3 days) Ronnald Ramp, RN, Miranda Comments Appt already scheduled for 2:30 tomorrow with Dr. Linna Darner at [facility]

## 2014-06-13 NOTE — Telephone Encounter (Signed)
error 

## 2014-06-14 ENCOUNTER — Ambulatory Visit (INDEPENDENT_AMBULATORY_CARE_PROVIDER_SITE_OTHER): Payer: Federal, State, Local not specified - PPO | Admitting: Internal Medicine

## 2014-06-14 ENCOUNTER — Encounter: Payer: Self-pay | Admitting: Internal Medicine

## 2014-06-14 VITALS — BP 120/82 | HR 82 | Temp 98.8°F | Wt 156.0 lb

## 2014-06-14 DIAGNOSIS — R0789 Other chest pain: Secondary | ICD-10-CM

## 2014-06-14 MED ORDER — MELOXICAM 7.5 MG PO TABS: ORAL_TABLET | ORAL | Status: DC

## 2014-06-14 NOTE — Progress Notes (Signed)
Pre visit review using our clinic review tool, if applicable. No additional management support is needed unless otherwise documented below in the visit note. 

## 2014-06-14 NOTE — Patient Instructions (Addendum)
Please remain out of work until Saturday 06/15/14.  Use an anti-inflammatory cream such as Aspercreme or Zostrix cream twice a day to the affected area as needed. In lieu of this warm moist compresses or  hot water bottle can be used. Do not apply ice . Consider glucosamine sulfate 1500 mg daily for joint symptoms. Take this daily  for 3 months and then leave it off for 2 months. This will rehydrate the cartilages.  I recommend you see Dr Charlann Boxer, Sports Medicine specialist., phone # [phone].

## 2014-06-14 NOTE — Progress Notes (Signed)
   Subjective:    Patient ID: Christina [name]. Christina [name], female    DOB: 18-Aug-1970, 44 y.o.   MRN: [id number]  HPI She has had right-sided chest pain for 8 days without specific trigger or injury. 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 The symptoms do improve on her day off. She also used naproxen which was of some benefit. The symptoms are described as aching up to  level VIII. It is aggravated by the lifting and pushing motions.  Review of Systems She is being followed by [facility] for migraines which presented with neurologic symptoms. She was seen in the emergency room in February this year. She had extensive imaging. She now has intermittent numbness and tingling in the left upper extremity and left lower extremity. She does have a follow-up scheduled with her Neurologist.    Objective:   Physical Exam  Pertinent or positive findings include: There is some variability in my ability to elicit her pain with palpation over the right anterior chest repeatedly.  General appearance :adequately nourished; in no distress. Eyes: No conjunctival inflammation or scleral icterus is present. Oral exam:  Lips and gums are healthy appearing.There is no oropharyngeal erythema or exudate noted. Dental hygiene is good. Heart:  Normal rate and regular rhythm. S1 and S2 normal without gallop, murmur, click, rub or other extra sounds   Lungs:Chest clear to auscultation; no wheezes, rhonchi,rales ,or rubs present.No increased work of breathing.  Vascular : all pulses equal ; no bruits present. Skin:Warm & dry.  Intact without suspicious lesions or rashes ; no tenting. There is no herpetic rash. Lymphatic: No lymphadenopathy is noted about the head, neck, axilla Neuro: Strength, tone & DTRs normal.        Assessment & Plan:  #1 right chest wall pain, variant of costochondritis suggested. This is in the context of repetitive  motion.  Plan: See orders and recommendations

## 2014-06-17 ENCOUNTER — Other Ambulatory Visit: Payer: Self-pay | Admitting: Certified Nurse Midwife

## 2014-06-17 DIAGNOSIS — R928 Other abnormal and inconclusive findings on diagnostic imaging of breast: Secondary | ICD-10-CM

## 2014-06-18 ENCOUNTER — Telehealth: Payer: Self-pay | Admitting: *Deleted

## 2014-06-18 NOTE — Telephone Encounter (Signed)
[facility] Client [place] [facility] Patient Name: BAILI STANG Gender: Female DOB: September 09, 1970 Age: 44 Y 11 M 3 D Return Phone Number: [phone] (Primary) Address: [address] [place]  [address] [facility] Client Client Site [place], [place] Type Call Call Type Triage / Clinical Relationship To Patient Self Appointment Disposition EMR Patient Reports Appointment Already Scheduled Info pasted into Epic Yes Return Phone Number [phone] (Primary) Chief Complaint CHEST PAIN (>=21 years) - pain, pressure, heaviness or tightness Initial Comment Caller states she has chest pressure and tightness, appt tomorrow, xfer from office for triage PreDisposition Call Doctor Nurse Assessment Nurse: Ronnald Ramp, RN, Miranda Date/Time (Eastern Time): 06/13/2014 2:26:52 PM Confirm and document reason for call. If symptomatic, describe symptoms. ---Caller states she has pain in her right upper chest for about 1 week. Gets worse when lifting or using her arms. Has the patient traveled out of the country within the last 30 days? ---Not Applicable Does the patient require triage? ---Yes Related visit to physician within the last 2 weeks? ---No Does the PT have any chronic conditions? (i.e. diabetes, asthma, etc.) ---Yes List chronic conditions. ---Thyroid, carotid blockage, Migraine Did the patient indicate they were pregnant? ---No Guidelines Guideline Title Affirmed Question Affirmed Notes Nurse Date/Time Eilene Ghazi Time) Chest Pain [1] Chest pain(s) lasting a few seconds AND [2] persists > 3 days Ronnald Ramp, RN, Miranda 06/13/2014 2:29:38 PM Disp. Time Eilene Ghazi Time) Disposition Final User 06/13/2014 2:22:06 PM Send to Urgent Queue Dalia Heading 06/13/2014 2:33:39 PM See PCP When Office is Open (within 3 days) Yes Ronnald Ramp, RN,  Miranda PLEASE NOTE: All timestamps contained within this report are represented as [place] Standard Time. CONFIDENTIALTY NOTICE: This fax transmission is intended only for the addressee. It contains information that is legally privileged, confidential or otherwise protected from use or disclosure. If you are not the intended recipient, you are strictly prohibited from reviewing, disclosing, copying using or disseminating any of this information or taking any action in reliance on or regarding this information. If you have received this fax in error, please notify us immediately by telephone so that we can arrange for its return to [place]. Phone: [phone], Toll-Free: [phone], Fax: [phone] Page: 2 of 2 Call Id: [id number] Caller Understands: Yes Disagree/Comply: Comply Care Advice Given Per Guideline SEE PCP WITHIN 3 DAYS: You need to be examined within 2 or 3 days. Call your doctor during regular office hours and make an appointment. (Note: if office will be open tomorrow, tell caller to call then, not in 3 days). PAIN MEDICINES: ACETAMINOPHEN (E.G., TYLENOL): NAPROXEN (E.G., ALEVE): IBUPROFEN (E.G., MOTRIN, ADVIL): CALL BACK IF: * Chest pain increases in frequency, duration or severity * Chest pain lasts over 5 minutes * Difficulty breathing or unusual sweating occurs * Fever over 100.5 F (38.1 C) * You become worse. CARE ADVICE given per Chest Pain (Adult) guideline. After Care Instructions Given Call Event Type User Date / Time Description Comments User: Leverne Humbles, RN Date/Time Eilene Ghazi Time): 06/13/2014 2:35:37 PM Appt already scheduled for 2:30 tomorrow with Dr. Linna Darner

## 2014-06-19 ENCOUNTER — Ambulatory Visit
Admission: RE | Admit: 2014-06-19 | Discharge: 2014-06-19 | Disposition: A | Discharge status: Home / Self Care | Payer: Federal, State, Local not specified - PPO | Source: Ambulatory Visit | Attending: Certified Nurse Midwife | Admitting: Certified Nurse Midwife

## 2014-06-19 ENCOUNTER — Other Ambulatory Visit: Payer: Self-pay | Admitting: Certified Nurse Midwife

## 2014-06-19 ENCOUNTER — Telehealth: Payer: Self-pay | Admitting: Family

## 2014-06-19 DIAGNOSIS — R921 Mammographic calcification found on diagnostic imaging of breast: Secondary | ICD-10-CM

## 2014-06-19 DIAGNOSIS — R928 Other abnormal and inconclusive findings on diagnostic imaging of breast: Secondary | ICD-10-CM

## 2014-06-19 NOTE — Telephone Encounter (Signed)
Patient is having a biopsy next Wednesday and she is needing approval from you to stop her baby aspirin for 5 days prior to her biopsy. Please advise patient

## 2014-06-20 NOTE — Telephone Encounter (Signed)
That is fine. She may stop it for 5 days prior to surgery.

## 2014-06-20 NOTE — Telephone Encounter (Signed)
Left msg on vm. Advised patient.

## 2014-06-21 ENCOUNTER — Encounter: Payer: Self-pay | Admitting: Family Medicine

## 2014-06-21 ENCOUNTER — Other Ambulatory Visit (INDEPENDENT_AMBULATORY_CARE_PROVIDER_SITE_OTHER): Payer: Federal, State, Local not specified - PPO

## 2014-06-21 ENCOUNTER — Ambulatory Visit (INDEPENDENT_AMBULATORY_CARE_PROVIDER_SITE_OTHER): Payer: Federal, State, Local not specified - PPO | Admitting: Family Medicine

## 2014-06-21 VITALS — BP 120/70 | HR 82 | Ht 67.0 in | Wt 156.0 lb

## 2014-06-21 DIAGNOSIS — R0781 Pleurodynia: Secondary | ICD-10-CM

## 2014-06-21 DIAGNOSIS — R0789 Other chest pain: Secondary | ICD-10-CM

## 2014-06-21 DIAGNOSIS — M940 Chondrocostal junction syndrome [Tietze]: Secondary | ICD-10-CM | POA: Insufficient documentation

## 2014-06-21 NOTE — Assessment & Plan Note (Signed)
I believe the patient's pain on the right side is likely secondary to a rib slippage first possible costochondritis that is already healing. I do not see anything on ultrasound shows such as a fracture. Patient continues to have difficulty I would think imaging would be necessary. Ultrasound today was negative. We discussed over-the-counter medications and icing protocol. Patient could respond to manipulation but was somewhat hesitant today. Patient will then come back and see me again in 2-3 weeks if having any worsening symptoms.

## 2014-06-21 NOTE — Assessment & Plan Note (Signed)
>>  ASSESSMENT AND PLAN FOR NONALLOPATHIC LESION-RIB CAGE WRITTEN ON 09/06/2022  2:50 PM BY Pecolia Marando LUM, NP  >>ASSESSMENT AND PLAN FOR RIB PAIN ON RIGHT SIDE WRITTEN ON 06/21/2014 11:51 AM BY Antoine Primas M, DO  I believe the patient's pain on the right side is likely secondary to a rib slippage first possible costochondritis that is already healing. I do not see anything on ultrasound shows such as a fracture. Patient continues to have difficulty I would think imaging would be necessary. Ultrasound today was negative. We discussed over-the-counter medications and icing protocol. Patient could respond to manipulation but was somewhat hesitant today. Patient will then come back and see me again in 2-3 weeks if having any worsening symptoms.

## 2014-06-21 NOTE — Progress Notes (Signed)
  [name] [address], [place] [address] Phone: [phone] Subjective:    I'm seeing this patient by the request  of:  Mauricio Po, FNP Dr. Linna Darner  CC: Right chest wall pain  JOI:NOMVEHMCNO Quetzal D. [name] is a 44 y.o. female coming in with complaint of right chest wall pain. Patient was originally seen by Dr. Linna Darner one week ago for a one-week history of right-sided chest wall pain. Patient states that it seemed to be worse with any type or repetitive motion and pushing trays that weighed approximately 30 pounds. Patient states that it did seem to improve after not doing the repetitive activity. Patient took oral anti-inflammatory's which seemed to be beneficial. Patient states that the pain level to get as high as 7 out of 10.     Past medical history, social, surgical and family history all reviewed in electronic medical record.   Review of Systems: No headache, visual changes, nausea, vomiting, diarrhea, constipation, dizziness, abdominal pain, skin rash, fevers, chills, night sweats, weight loss, swollen lymph nodes, body aches, joint swelling, muscle aches, chest pain, shortness of breath, mood changes.   Objective Blood pressure 120/70, pulse 82, height 5\' 7"  (1.702 m), weight 156 lb (70.761 kg), last menstrual period 05/13/2014, SpO2 99 %.  General: No apparent distress alert and oriented x3 mood and affect normal, dressed appropriately.  HEENT: Pupils equal, extraocular movements intact  Respiratory: Patient's speak in full sentences and does not appear short of breath  Cardiovascular: No lower extremity edema, non tender, no erythema  Skin: Warm dry intact with no signs of infection or rash on extremities or on axial skeleton.  Abdomen: Soft nontender  Neuro: Cranial nerves II through XII are intact, neurovascularly intact in all extremities with 2+ DTRs and 2+ pulses.  Lymph: No lymphadenopathy of posterior or anterior cervical  chain or axillae bilaterally.  Gait normal with good balance and coordination.  MSK:  Non tender with full range of motion and good stability and symmetric strength and tone of shoulders, elbows, wrist, hip, knee and ankles bilaterally.  Chest wall exam shows vision is tender to palpation over the fourth and fifth costochondral sternal junction. No specific findings noted on palpation. No rash noted.  Limited musculoskeletal ultrasound was performed and interpreted by Hulan Saas, M Limited ultrasound patient's chest wall shows patient does have mild increase in Doppler flow but no specific findings. Impression: Healed or healing costochondral injury.  [id number]; 15 minutes spent for Therapeutic exercises as stated in above notes.  This included exercises focusing on stretching, strengthening, with significant focus on eccentric aspects.  She didn't scapular exercises to help with keeping patient's rib within place. Discussed proper lifting mechanics. Proper technique shown and discussed handout in great detail with ATC.  All questions were discussed and answered.     Impression and Recommendations:     This case required medical decision making of moderate complexity.

## 2014-06-21 NOTE — Assessment & Plan Note (Signed)
>>  ASSESSMENT AND PLAN FOR RIB PAIN ON RIGHT SIDE WRITTEN ON 06/21/2014 11:51 AM BY Antoine Primas M, DO  I believe the patient's pain on the right side is likely secondary to a rib slippage first possible costochondritis that is already healing. I do not see anything on ultrasound shows such as a fracture. Patient continues to have difficulty I would think imaging would be necessary. Ultrasound today was negative. We discussed over-the-counter medications and icing protocol. Patient could respond to manipulation but was somewhat hesitant today. Patient will then come back and see me again in 2-3 weeks if having any worsening symptoms.

## 2014-06-21 NOTE — Progress Notes (Signed)
Pre visit review using our clinic review tool, if applicable. No additional management support is needed unless otherwise documented below in the visit note. 

## 2014-06-21 NOTE — Patient Instructions (Addendum)
Good to see you.  Ice 20 minutes 2 times daily. Usually after activity and before bed. Exercises 3 times a week.  Take medicine 3 times a day for 3 days If manipulation helps see me again in 3 weeks.    Standing:  Secure a rubber exercise band/tubing so that it is at the height of your shoulders when you are either standing or sitting on a firm arm-less chair.  Grasp an end of the band/tubing in each hand and have your palms face each other. Straighten your elbows and lift your hands straight in front of you at shoulder height. Step back away from the secured end of band/tubing until it becomes tense.  Squeeze your shoulder blades together. Keeping your elbows locked and your hands at shoulder-height, bring your hands out to your side.  Hold __________ seconds. Slowly ease the tension on the band/tubing as you reverse the directions and return to the starting position. Repeat __________ times. Complete this exercise __________ times per day. STRENGTH - Scapular Retractors  Secure a rubber exercise band/tubing so that it is at the height of your shoulders when you are either standing or sitting on a firm arm-less chair.  With a palm-down grip, grasp an end of the band/tubing in each hand. Straighten your elbows and lift your hands straight in front of you at shoulder height. Step back away from the secured end of band/tubing until it becomes tense.  Squeezing your shoulder blades together, draw your elbows back as you bend them. Keep your upper arm lifted away from your body throughout the exercise.  Hold __________ seconds. Slowly ease the tension on the band/tubing as you reverse the directions and return to the starting position. Repeat __________ times. Complete this exercise __________ times per day. STRENGTH - Shoulder Extensors   Secure a rubber exercise band/tubing so that it is at the height of your shoulders when you are either standing or sitting on a firm arm-less  chair.  With a thumbs-up grip, grasp an end of the band/tubing in each hand. Straighten your elbows and lift your hands straight in front of you at shoulder height. Step back away from the secured end of band/tubing until it becomes tense.  Squeezing your shoulder blades together, pull your hands down to the sides of your thighs. Do not allow your hands to go behind you.  Hold for __________ seconds. Slowly ease the tension on the band/tubing as you reverse the directions and return to the starting position. Repeat __________ times. Complete this exercise __________ times per day.  STRENGTH - Scapular Retractors and External Rotators  Secure a rubber exercise band/tubing so that it is at the height of your shoulders when you are either standing or sitting on a firm arm-less chair.  With a palm-down grip, grasp an end of the band/tubing in each hand. Bend your elbows 90 degrees and lift your elbows to shoulder height at your sides. Step back away from the secured end of band/tubing until it becomes tense.  Squeezing your shoulder blades together, rotate your shoulder so that your upper arm and elbow remain stationary, but your fists travel upward to head-height.  Hold __________ for seconds. Slowly ease the tension on the band/tubing as you reverse the directions and return to the starting position. Repeat __________ times. Complete this exercise __________ times per day.  STRENGTH - Scapular Retractors and External Rotators, Rowing  Secure a rubber exercise band/tubing so that it is at the height of your shoulders when  you are either standing or sitting on a firm arm-less chair.  With a palm-down grip, grasp an end of the band/tubing in each hand. Straighten your elbows and lift your hands straight in front of you at shoulder height. Step back away from the secured end of band/tubing until it becomes tense.  Step 1: Squeeze your shoulder blades together. Bending your elbows, draw your hands  to your chest as if you are rowing a boat. At the end of this motion, your hands and elbow should be at shoulder-height and your elbows should be out to your sides.  Step 2: Rotate your shoulder to raise your hands above your head. Your forearms should be vertical and your upper-arms should be horizontal.  Hold for __________ seconds. Slowly ease the tension on the band/tubing as you reverse the directions and return to the starting position. Repeat __________ times. Complete this exercise __________ times per day.  STRENGTH - Scapular Retractors and Elevators  Secure a rubber exercise band/tubing so that it is at the height of your shoulders when you are either standing or sitting on a firm arm-less chair.  With a thumbs-up grip, grasp an end of the band/tubing in each hand. Step back away from the secured end of band/tubing until it becomes tense.  Squeezing your shoulder blades together, straighten your elbows and lift your hands straight over your head.  Hold for __________ seconds. Slowly ease the tension on the band/tubing as you reverse the directions and return to the starting position. Repeat __________ times. Complete this exercise __________ times per day.  Document Released: 01/11/2005 Document Revised: 04/05/2011 Document Reviewed: 04/25/2008 [facility] Patient Information 2015 [place], [place]. This information is not intended to replace advice given to you by your health care provider. Make sure you discuss any questions you have with your health care provider.

## 2014-06-26 ENCOUNTER — Ambulatory Visit
Admission: RE | Admit: 2014-06-26 | Discharge: 2014-06-26 | Disposition: A | Discharge status: Home / Self Care | Payer: Federal, State, Local not specified - PPO | Source: Ambulatory Visit | Attending: Certified Nurse Midwife | Admitting: Certified Nurse Midwife

## 2014-06-26 DIAGNOSIS — R921 Mammographic calcification found on diagnostic imaging of breast: Secondary | ICD-10-CM

## 2014-06-26 DIAGNOSIS — R928 Other abnormal and inconclusive findings on diagnostic imaging of breast: Secondary | ICD-10-CM

## 2014-06-26 HISTORY — PX: BREAST BIOPSY: SHX20

## 2014-06-27 ENCOUNTER — Ambulatory Visit: Payer: BC Managed Care – PPO | Admitting: Endocrinology

## 2014-07-03 ENCOUNTER — Encounter: Payer: Self-pay | Admitting: Neurology

## 2014-07-03 ENCOUNTER — Ambulatory Visit: Payer: Federal, State, Local not specified - PPO | Admitting: Family Medicine

## 2014-07-03 ENCOUNTER — Ambulatory Visit (INDEPENDENT_AMBULATORY_CARE_PROVIDER_SITE_OTHER): Payer: Federal, State, Local not specified - PPO | Admitting: Neurology

## 2014-07-03 VITALS — BP 110/74 | HR 86 | Ht 67.0 in | Wt 155.2 lb

## 2014-07-03 DIAGNOSIS — G43109 Migraine with aura, not intractable, without status migrainosus: Secondary | ICD-10-CM | POA: Diagnosis not present

## 2014-07-03 DIAGNOSIS — R531 Weakness: Secondary | ICD-10-CM | POA: Insufficient documentation

## 2014-07-03 DIAGNOSIS — M6289 Other specified disorders of muscle: Secondary | ICD-10-CM

## 2014-07-03 NOTE — Progress Notes (Signed)
NEUROLOGY FOLLOW UP OFFICE NOTE  Stephenia D. 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  Records reviewed.  UPDATE: Topiramate was discontinued due to dizziness.  She continues to take naproxen as needed for headache.  She has only had 5 headaches in past 2 months.  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  However, she had subsequent ED visits where she presented with left sided weakness, so I felt her symptoms were not related to the left ICA and vascular consult was cancelled.  She continues to have left sided symptoms involving the arm and leg.  She has constant feeling of weakness, tingling in the wrist or ankle, and generalized aches.  Symptoms fluctuate during the day.  She denies neck pain or pain shooting down the arm or leg.  She also described an episode where she heard crackling in her ears, followed by flashing lights lasting 10 seconds.  This was a new phenomenon.  HISTORY: On 03/10/14, she developed sudden onset of vision loss in the left eye.  Over the next 30 minutes, she developed clumsiness and heaviness of her right arm and hand.  There was no speech or language disturbance.  This lasted a couple of hours.  Several hours later, she developed a severe 8-9/10 nonthrobbing left sided headache.  It was associated with nausea, photophobia and phonophobia.  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  MRI of brain performed on 03/11/14 was normal.  She was advised to start ASA 81mg  daily.  She  received a cocktail, which eased the pain.  The headache persisted without aura symptoms at an intensity of 5/10.  Since it did not completely resolve, she returned to the ED about a week later.  She received an Imitrex injection which caused the left vision loss aura, which persisted off an on for a few days.  The headache persisted for 2 weeks before it completely resolved.  She did not have the focal loss of fine-motor skills in the right hand.    Since then, she has had 3 other episodes of headache without the visual aura and focal weakness.  It is bi-frontal and throbbing, about 5/10.  It is associated with photophobia and phonophobia, but not nausea.  She will take naproxen 500mg  and cup of coffee which eases the pain.  It will last a couple of days.  She currently has a mild headache which started 2 days ago.  She does not want a Toradol injection.  She has no prior history of migraine.  Her father had migraines.  There is no family history of first degree relatives with stroke  Caffeine:  Dr. Malachi Bonds Alcohol:  no Smoker:  no Diet:  Healthy.  Stays hydrated Exercise:  no Depression/stress:  Some stress related to her mother's illness (colon cancer) Sleep hygiene:  Good.  PAST MEDICAL HISTORY: Past Medical History  Diagnosis Date  . Hyperthyroidism   . Migraines   . Hypercholesteremia     MEDICATIONS: Current Outpatient Prescriptions on File Prior to Visit  Medication  Sig Dispense Refill  . aspirin 325 MG tablet Take 81 mg by mouth daily.     . Biotin w/ Vitamins C & E 1250-7.5-7.5 MCG-MG-UNT CHEW Chew 2 tablets by mouth daily.    Garlan Fillers Peroxide (EAR DROPS OT) Place in ear(s).    . cholecalciferol (VITAMIN D) 1000 UNITS tablet Take 1,000 Units by mouth daily.    . methimazole (TAPAZOLE) 5 MG tablet Take 5 mg by mouth every Monday, Wednesday, and Friday.     . naproxen (NAPROSYN) 500 MG tablet Take 1 tablet (500 mg total) by mouth 2 (two) times daily as needed. (Patient  taking differently: Take 500 mg by mouth 2 (two) times daily as needed for mild pain or moderate pain (pain). ) 60 tablet 2   No current facility-administered medications on file prior to visit.    ALLERGIES: Allergies  Allergen Reactions  . Topamax [Topiramate]     Pain in legs, SOB, and almost passed out    FAMILY HISTORY: Family History  Problem Relation Age of Onset  . Thyroid disease Neg Hx   . Diabetes Mother   . Hypertension Mother   . Colon cancer Mother   . Breast cancer Mother   . Prostate cancer Father     SOCIAL HISTORY: History   Social History  . Marital Status: Single    Spouse Name: N/A  . Number of Children: 1  . Years of Education: 14   Occupational History  . Mail Processor    Social History Main Topics  . Smoking status: Never Smoker   . Smokeless tobacco: Never Used  . Alcohol Use: No  . Drug Use: No  . Sexual Activity:    Partners: Male   Other Topics Concern  . Not on file   Social History Narrative   Born and raised in [place]. Currently resides in a house with her child. No pets. Fun: Go to the movies.    Denies religious beliefs effecting health care.     REVIEW OF SYSTEMS: Constitutional: No fevers, chills, or sweats, no generalized fatigue, change in appetite Eyes: No visual changes, double vision, eye pain Ear, nose and throat: No hearing loss, ear pain, nasal congestion, sore throat Cardiovascular: No chest pain, palpitations Respiratory:  No shortness of breath at rest or with exertion, wheezes GastrointestinaI: No nausea, vomiting, diarrhea, abdominal pain, fecal incontinence Genitourinary:  No dysuria, urinary retention or frequency Musculoskeletal:  No neck pain, back pain Integumentary: No rash, pruritus, skin lesions Neurological: as above Psychiatric: No depression, insomnia, anxiety Endocrine: No palpitations, fatigue, diaphoresis, mood swings, change in appetite, change in weight, increased  thirst Hematologic/Lymphatic:  No anemia, purpura, petechiae. Allergic/Immunologic: no itchy/runny eyes, nasal congestion, recent allergic reactions, rashes  PHYSICAL EXAM: Filed Vitals:   07/03/14 1529  BP: 110/74  Pulse: 86   General: No acute distress Head:  Normocephalic/atraumatic Eyes:  Fundoscopic exam unremarkable without vessel changes, exudates, hemorrhages or papilledema. Neck: supple, no paraspinal tenderness, full range of motion Heart:  Regular rate and rhythm Lungs:  Clear to auscultation bilaterally Back: No paraspinal tenderness Neurological Exam: alert and oriented to person, place, and time. Attention span and concentration intact, recent and remote memory intact, fund of knowledge intact.  Speech fluent and not dysarthric, language intact.  CN II-XII intact. Fundoscopic exam unremarkable without vessel changes, exudates, hemorrhages or papilledema.  Bulk and tone normal, muscle strength 5/5 throughout.  Sensation to light touch, temperature and vibration intact.  Deep tendon reflexes 2+ throughout, toes  downgoing.  Finger to nose and heel to shin testing intact.  Gait normal, Romberg negative.  IMPRESSION: Migraine with visual aura.  Vision loss probably symptom of migraine and not vascular Left sided weakness, pain and numbness.  I have no explanation for these symptoms.  She exhibits no objective weakness or numbness on exam.  She exhibits no upper motor neuron signs to suggest myelopathy.  Symptoms are not consistent with a neuropathy.  At this point, I don't know where to go from here regarding these somatic complaints.   PLAN: Continue naproxen as needed for now Continue to monitor worsening of other symptoms. Even though I doubt if this weakness is related to migraine (since it switched sides), I would still favor continue ASA 81mg  daily and avoid triptans to be sure. Follow up in 3 months.  15 minutes spent face to face with patient, over 50% spent discussing  possible diagnoses and plan.  Metta Clines, DO  CC:  Mauricio Po

## 2014-07-03 NOTE — Patient Instructions (Signed)
Migraine Recommendations: 1.   3.  Limit use of naproxen and other pain relievers to no more than 2 days out of the week.  These medications include acetaminophen, ibuprofen, triptans and narcotics.  This will help reduce risk of rebound headaches. 4.  Be aware of common food triggers such as processed sweets, processed foods with nitrites (such as deli meat, hot dogs, sausages), foods with MSG, alcohol (such as wine), chocolate, nuts, certain cheeses, certain fruits (dried fruits, some citrus fruit), vinegar, diet soda. 4.  Avoid caffeine 5.  Routine exercise 6.  Proper sleep hygiene 7.  Stay adequately hydrated with water 8.  Keep a headache diary. 9.  Maintain proper stress management. 10.  Follow up in 3 months.  Call with questions or concerns.

## 2014-07-10 ENCOUNTER — Ambulatory Visit (INDEPENDENT_AMBULATORY_CARE_PROVIDER_SITE_OTHER): Payer: Federal, State, Local not specified - PPO | Admitting: Certified Nurse Midwife

## 2014-07-10 ENCOUNTER — Encounter: Payer: Self-pay | Admitting: Certified Nurse Midwife

## 2014-07-10 VITALS — BP 110/70 | HR 70 | Resp 16 | Ht 67.0 in | Wt 153.0 lb

## 2014-07-10 DIAGNOSIS — Z124 Encounter for screening for malignant neoplasm of cervix: Secondary | ICD-10-CM | POA: Diagnosis not present

## 2014-07-10 DIAGNOSIS — Z803 Family history of malignant neoplasm of breast: Secondary | ICD-10-CM | POA: Diagnosis not present

## 2014-07-10 DIAGNOSIS — Z01419 Encounter for gynecological examination (general) (routine) without abnormal findings: Secondary | ICD-10-CM | POA: Diagnosis not present

## 2014-07-10 NOTE — Patient Instructions (Signed)

## 2014-07-10 NOTE — Progress Notes (Signed)
44 y.o. [id number] Single  African American Fe here to establish gyn care and  for annual exam. Periods in the past were scan to none with previous Depo Provera, stopped Depo greater than a year ago. Periods now normal, except had 2 in one month, one time. New onset headache, with aura with hemaplegic occurrence. Considering contraception options. Declines STD screening Sees Dr. Loanne Drilling for hyperthyroid, stable medication. Sees [facility] P[facility] yearly/lab/aex. Family history of breast cancer, mother age 53 and colon cancer at age 90. Patient recently seen for right breast biopsy which was negative. Under follow up in one year with mammogram. Had colonoscopy in 2014 which was negative. 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 No health concerns today other than contraceptive options.   Patient's last menstrual period was 06/19/2014.          Sexually active: Yes.    The current method of family planning is condoms all the time.    Exercising: No.  exercise Smoker:  no  Health Maintenance: Pap:  2014 neg per patient, no abnormals MMG:  06/13/14, 6/16 breast biopsy rt benign Colonoscopy: 2014 negative 5 year due to family history BMD:   none TDaP:  05/27/14 Labs: none Self breast exam: done monthly   reports that she has never smoked. She has never used smokeless tobacco. She reports that she does not drink alcohol or use illicit drugs.  Past Medical History  Diagnosis Date  . Hyperthyroidism   . Migraines     with asthma  . Hypercholesteremia     Past Surgical History  Procedure Laterality Date  . Breast biopsy Right 06-26-14    benign per patient    Current Outpatient Prescriptions  Medication Sig Dispense Refill  . aspirin 81 MG tablet Take 81 mg by mouth daily.    . Biotin w/ Vitamins C & E 1250-7.5-7.5 MCG-MG-UNT CHEW Chew 2 tablets by mouth daily.    Garlan Fillers Peroxide (EAR DROPS OT) Place in ear(s) as needed.     . cholecalciferol (VITAMIN D) 1000 UNITS  tablet Take 1,000 Units by mouth daily.    . Glucosamine 500 MG CAPS Take 500 mg by mouth daily.    . methimazole (TAPAZOLE) 5 MG tablet Take 5 mg by mouth every Monday, Wednesday, and Friday.     . naproxen (NAPROSYN) 500 MG tablet Take 1 tablet (500 mg total) by mouth 2 (two) times daily as needed. (Patient taking differently: Take 500 mg by mouth 2 (two) times daily as needed for mild pain or moderate pain (pain). ) 60 tablet 2  . Omega-3 Fatty Acids (FISH OIL) 1000 MG CAPS Take by mouth.     No current facility-administered medications for this visit.    Family History  Problem Relation Age of Onset  . Diabetes Mother   . Hypertension Mother   . Colon cancer Mother   . Breast cancer Mother   . Prostate cancer Father     ROS:  Pertinent items are noted in HPI.  Otherwise, a comprehensive ROS was negative.  Exam:   BP 110/70 mmHg  Pulse 70  Resp 16  Ht 5\' 7"  (1.702 m)  Wt 153 lb (69.4 kg)  BMI 23.96 kg/m2  LMP 06/19/2014 Height: 5\' 7"  (170.2 cm) Ht Readings from Last 3 Encounters:  07/10/14 5\' 7"  (1.702 m)  07/03/14 5\' 7"  (1.702 m)  06/21/14 5\' 7"  (1.702 m)    General appearance: alert, cooperative and appears stated age Head: Normocephalic,  without obvious abnormality, atraumatic Neck: no adenopathy, supple, symmetrical, trachea midline and thyroid normal to inspection and palpation Lungs: clear to auscultation bilaterally Breasts: normal appearance, no masses or tenderness, No nipple retraction or dimpling, No nipple discharge or bleeding, No axillary or supraclavicular adenopathy Positive for mass in right breast at 12 o'clock with noted healing biopsy scar. Heart: regular rate and rhythm Abdomen: soft, non-tender; no masses,  no organomegaly Extremities: extremities normal, atraumatic, no cyanosis or edema Skin: Skin color, texture, turgor normal. No rashes or lesions Lymph nodes: Cervical, supraclavicular, and axillary nodes normal. No abnormal inguinal nodes  palpated Neurologic: Grossly normal   Pelvic: External genitalia:  no lesions              Urethra:  normal appearing urethra with no masses, tenderness or lesions              Bartholin's and Skene's: normal                 Vagina: normal appearing vagina with normal color and discharge, no lesions              Cervix: normal,non tender, no lesions              Pap taken: Yes.   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 aware of genetic screening, not sure if done will check  P:   Reviewed health and wellness pertinent to exam  Discussed due to new diagnosis with migraine with aura, OCP is not an option due to estrogen use can be a risk. Discussed Progesterone options, (does not want Depo again) POP, Nexplanon, IUD, risks/benefits/side effects/bleeding expectations,insertion/removal. Questions addressed. Information given on Nexplanon/IUD. Patient will decide and advise.   Continue follow up with MD as indicated.  Pap smear taken with HPVHR  Stressed SBE and need for exam if change.    counseled on breast self exam, mammography screening, adequate intake of calcium and vitamin D, diet and exercise return annually or prn  An After Visit Summary was printed and given to the patient.

## 2014-07-11 NOTE — Progress Notes (Signed)
Reviewed personally.  M. Suzanne Tamiyah Moulin, MD.  

## 2014-07-12 ENCOUNTER — Ambulatory Visit (INDEPENDENT_AMBULATORY_CARE_PROVIDER_SITE_OTHER): Payer: Federal, State, Local not specified - PPO | Admitting: Family Medicine

## 2014-07-12 ENCOUNTER — Encounter: Payer: Self-pay | Admitting: Family Medicine

## 2014-07-12 VITALS — BP 112/70 | HR 72 | Ht 67.0 in | Wt 156.0 lb

## 2014-07-12 DIAGNOSIS — M9908 Segmental and somatic dysfunction of rib cage: Secondary | ICD-10-CM | POA: Diagnosis not present

## 2014-07-12 DIAGNOSIS — M940 Chondrocostal junction syndrome [Tietze]: Secondary | ICD-10-CM | POA: Diagnosis not present

## 2014-07-12 DIAGNOSIS — M9901 Segmental and somatic dysfunction of cervical region: Secondary | ICD-10-CM

## 2014-07-12 DIAGNOSIS — M999 Biomechanical lesion, unspecified: Secondary | ICD-10-CM | POA: Insufficient documentation

## 2014-07-12 DIAGNOSIS — M9902 Segmental and somatic dysfunction of thoracic region: Secondary | ICD-10-CM

## 2014-07-12 LAB — IPS PAP TEST WITH HPV

## 2014-07-12 NOTE — Progress Notes (Signed)
Pre visit review using our clinic review tool, if applicable. No additional management support is needed unless otherwise documented below in the visit note. 

## 2014-07-12 NOTE — Patient Instructions (Signed)
Good to see you You are doing great Work on the sleeping position Use duexis 3 times a day for 3 days when having flare or ok to take nightly Ice still can help Do exercises still 3 times a week.  See me again in 6 weeks.

## 2014-07-12 NOTE — Assessment & Plan Note (Signed)
Patient is having more of a slipped rib syndrome. We discussed continuing with the scapular stabilization exercises that I think will be beneficial. We discussed other things such as sleeping mechanics it can be beneficial as well as lifting mechanics. Patient will continue with the over-the-counter natural supplementation. Patient given a prescription for anti-inflammatory if necessary. Patient come back and see me again in 3-4 weeks.

## 2014-07-12 NOTE — Progress Notes (Signed)
  [name] [address] [place], [place] [address] Phone: [phone] Subjective:     CC: Right chest wall pain follow up  FEO:[id number] Christina [name] is a 44 y.o. female coming in with complaint of right chest wall pain. Patient had what seemed to be more of a costochondral injury that seem to be healing slowly. Patient was given a trial of oral as well as topical anti-inflammatory, icing, home exercises. Patient was put on some limited duty at work. Patient states. She is doing significantly better. Patient states though that she still is having some mild upper back pain. Seems to give her some difficulty after a lot of repetitive activity. Patient states it does not stop her from activity. At night can be painful. Patient isn't doing the exercises fairly regularly.      Past medical history, social, surgical and family history all reviewed in electronic medical record.   Review of Systems: No headache, visual changes, nausea, vomiting, diarrhea, constipation, dizziness, abdominal pain, skin rash, fevers, chills, night sweats, weight loss, swollen lymph nodes, body aches, joint swelling, muscle aches, chest pain, shortness of breath, mood changes.   Objective Blood pressure 112/70, pulse 72, height 5\' 7"  (1.702 m), weight 156 lb (70.761 kg), last menstrual period 06/19/2014, SpO2 98 %.  General: No apparent distress alert and oriented x3 mood and affect normal, dressed appropriately.  HEENT: Pupils equal, extraocular movements intact  Respiratory: Patient's speak in full sentences and does not appear short of breath  Cardiovascular: No lower extremity edema, non tender, no erythema  Skin: Warm dry intact with no signs of infection or rash on extremities or on axial skeleton.  Abdomen: Soft nontender  Neuro: Cranial nerves II through XII are intact, neurovascularly intact in all extremities with 2+ DTRs and 2+ pulses.  Lymph: No lymphadenopathy of  posterior or anterior cervical chain or axillae bilaterally.  Gait normal with good balance and coordination.  MSK:  Non tender with full range of motion and good stability and symmetric strength and tone of shoulders, elbows, wrist, hip, knee and ankles bilaterally.  Chest wall minimally tender to palpation over the ribs previously noted but significantly improved from previous exam.  Osteopathic findings C4 flexed rotated and side bent left T3 extended rotated and side bent right with inhaled third rib T6 extended rotated and side bent right with inhaled 6 rib     Impression and Recommendations:     This case required medical decision making of moderate complexity.

## 2014-07-12 NOTE — Assessment & Plan Note (Signed)
Decision today to treat with OMT was based on Physical Exam  After verbal consent patient was treated with HVLA, ME, FPR techniques in cervical, thoracic and rib areas  Patient tolerated the procedure well with improvement in symptoms  Patient given exercises, stretches and lifestyle modifications  See medications in patient instructions if given  Patient will follow up in 3-6 weeks

## 2014-07-15 ENCOUNTER — Other Ambulatory Visit: Payer: Self-pay | Admitting: Nurse Practitioner

## 2014-07-15 ENCOUNTER — Telehealth: Payer: Self-pay | Admitting: Certified Nurse Midwife

## 2014-07-15 MED ORDER — FLUCONAZOLE 150 MG PO TABS: 150.0000 mg | ORAL_TABLET | Freq: Once | ORAL | Status: DC

## 2014-07-15 NOTE — Telephone Encounter (Signed)
Patient says she received a message from her pharmacy about a medication that was called in for her and she would like to speak with nurse regarding this.

## 2014-07-15 NOTE — Telephone Encounter (Signed)
Spoke with patient. Advised of message as seen below from Milford Cage, [place]. Patient is agreeable and verbalizes understanding.  Notes Recorded by Kem Boroughs, FNP on 07/15/2014 at 10:11 AM Pap02. Let pt. Know that she had a yeast infection. She needs Diflucan for yeast and RX is sent in  Routing to provider for final review. Patient agreeable to disposition. Will close encounter.

## 2014-08-22 ENCOUNTER — Encounter: Payer: Self-pay | Admitting: Family Medicine

## 2014-08-22 ENCOUNTER — Ambulatory Visit (INDEPENDENT_AMBULATORY_CARE_PROVIDER_SITE_OTHER): Payer: Federal, State, Local not specified - PPO | Admitting: Family Medicine

## 2014-08-22 VITALS — BP 120/72 | HR 72 | Ht 67.0 in | Wt 154.0 lb

## 2014-08-22 DIAGNOSIS — M9902 Segmental and somatic dysfunction of thoracic region: Secondary | ICD-10-CM | POA: Diagnosis not present

## 2014-08-22 DIAGNOSIS — M62838 Other muscle spasm: Secondary | ICD-10-CM

## 2014-08-22 DIAGNOSIS — M9908 Segmental and somatic dysfunction of rib cage: Secondary | ICD-10-CM

## 2014-08-22 DIAGNOSIS — M999 Biomechanical lesion, unspecified: Secondary | ICD-10-CM

## 2014-08-22 DIAGNOSIS — M9901 Segmental and somatic dysfunction of cervical region: Secondary | ICD-10-CM | POA: Diagnosis not present

## 2014-08-22 DIAGNOSIS — M940 Chondrocostal junction syndrome [Tietze]: Secondary | ICD-10-CM

## 2014-08-22 MED ORDER — IBUPROFEN-FAMOTIDINE 800-26.6 MG PO TABS: ORAL_TABLET | ORAL | Status: DC

## 2014-08-22 MED ORDER — BACLOFEN 10 MG PO TABS: 10.0000 mg | ORAL_TABLET | Freq: Every evening | ORAL | Status: DC | PRN

## 2014-08-22 NOTE — Progress Notes (Signed)
Pre visit review using our clinic review tool, if applicable. No additional management support is needed unless otherwise documented below in the visit note. 

## 2014-08-22 NOTE — Assessment & Plan Note (Signed)
Patient is improving somewhat but is having difficulty breaking through to have no pain. We did discuss the possibility of trigger point injections which patient declined today. We discussed icing regimen and home exercises. We discussed continuing the over-the-counter natural supplementation. We discussed the possibility of gabapentin which patient declined. Patient will continue the conservative therapies and we will see patient back again in 3-4 weeks. Patient was given a muscle relaxer to have at night.

## 2014-08-22 NOTE — Patient Instructions (Addendum)
No direct air if possible Continue the posture exercises Conitnue the vitamins Zanaflex nightly if needed Duexis up to 3 times daily See me again in 3-4 weeks/.

## 2014-08-22 NOTE — Assessment & Plan Note (Signed)
Decision today to treat with OMT was based on Physical Exam  After verbal consent patient was treated with HVLA, ME, FPR techniques in cervical, thoracic and rib areas  Patient tolerated the procedure well with improvement in symptoms  Patient given exercises, stretches and lifestyle modifications  See medications in patient instructions if given  Patient will follow up in 3-6 weeks

## 2014-08-22 NOTE — Progress Notes (Signed)
  [name] [place] [employer], [place] [address] Phone: [phone] Subjective:     CC: Right chest wall pain follow up  [id number] Christina [name] is a 44 y.o. female coming in with complaint of right chest wall pain. Patient had what seemed to be more of a costochondral injury. Patient was doing more activities recently. Patient has been working 2 jobs full-time. Patient continues to have the discomfort especially with repetitive activity but now it seems to actually be loosening up throughout the day. States that she does wake up in the morning though with the pain again. Continues to be very localized on the right side. Has not been taking any medicines on a regular basis other than vitamin D.      Past medical history, social, surgical and family history all reviewed in electronic medical record.   Review of Systems: No headache, visual changes, nausea, vomiting, diarrhea, constipation, dizziness, abdominal pain, skin rash, fevers, chills, night sweats, weight loss, swollen lymph nodes, body aches, joint swelling, muscle aches, chest pain, shortness of breath, mood changes.   Objective Blood pressure 120/72, pulse 72, height 5\' 7"  (1.702 m), weight 154 lb (69.854 kg), SpO2 99 %.  General: No apparent distress alert and oriented x3 mood and affect normal, dressed appropriately.  HEENT: Pupils equal, extraocular movements intact  Respiratory: Patient's speak in full sentences and does not appear short of breath  Cardiovascular: No lower extremity edema, non tender, no erythema  Skin: Warm dry intact with no signs of infection or rash on extremities or on axial skeleton.  Abdomen: Soft nontender  Neuro: Cranial nerves II through XII are intact, neurovascularly intact in all extremities with 2+ DTRs and 2+ pulses.  Lymph: No lymphadenopathy of posterior or anterior cervical chain or axillae bilaterally.  Gait normal with good balance and coordination.   MSK:  Non tender with full range of motion and good stability and symmetric strength and tone of shoulders, elbows, wrist, hip, knee and ankles bilaterally.  Nontender over the chest wall which is an improvement Muscle spasm of the right trapezius and rhomboid noted today.  Osteopathic findings C4 flexed rotated and side bent left T3 extended rotated and side bent right with inhaled third rib T6 extended rotated and side bent right with inhaled 6 rib     Impression and Recommendations:     This case required medical decision making of moderate complexity.

## 2014-09-02 ENCOUNTER — Ambulatory Visit: Payer: Federal, State, Local not specified - PPO | Admitting: Endocrinology

## 2014-09-11 ENCOUNTER — Other Ambulatory Visit: Payer: Self-pay

## 2014-09-11 ENCOUNTER — Encounter: Payer: Self-pay | Admitting: Endocrinology

## 2014-09-11 ENCOUNTER — Ambulatory Visit (INDEPENDENT_AMBULATORY_CARE_PROVIDER_SITE_OTHER): Payer: Federal, State, Local not specified - PPO | Admitting: Endocrinology

## 2014-09-11 DIAGNOSIS — E0590 Thyrotoxicosis, unspecified without thyrotoxic crisis or storm: Secondary | ICD-10-CM | POA: Diagnosis not present

## 2014-09-11 LAB — T4, FREE: Free T4: 0.72 ng/dL (ref 0.60–1.60)

## 2014-09-11 LAB — TSH: TSH: 1.95 u[IU]/mL (ref 0.35–4.50)

## 2014-09-12 NOTE — Progress Notes (Signed)
Subjective:    Patient ID: Christina [name]. [name], female    DOB: 1970-08-01, 44 y.o.   MRN: [id number]  HPI Pt returns for f/u of hyperthyroidism (dx'ed in early 2015, in [place]; nuc med scan showed diffuse uptake (45% at 24 hrs); US showed diffuse goiter, with multiple very small nodules; pt is unaware why tapazole was chosen as rx, but she wishes to continue). she takes tapazole as rx'ed.  pt states she feels well in general. Past Medical History  Diagnosis Date  . Hyperthyroidism   . Migraines     with asthma  . Hypercholesteremia     Past Surgical History  Procedure Laterality Date  . Breast biopsy Right 06-26-14    benign per patient    Social History   Social History  . Marital Status: Single    Spouse Name: N/A  . Number of Children: 1  . Years of Education: 14   Occupational History  . Mail Processor    Social History Main Topics  . Smoking status: Never Smoker   . Smokeless tobacco: Never Used  . Alcohol Use: No  . Drug Use: No  . Sexual Activity:    Partners: Male    Birth Control/ Protection: Condom   Other Topics Concern  . Not on file   Social History Narrative   Born and raised in [place]. Currently resides in a house with her child. No pets. Fun: Go to the movies.    Denies religious beliefs effecting health care.     Current Outpatient Prescriptions on File Prior to Visit  Medication Sig Dispense Refill  . aspirin 81 MG tablet Take 81 mg by mouth daily.    . baclofen (LIORESAL) 10 MG tablet Take 1 tablet (10 mg total) by mouth at bedtime as needed for muscle spasms. 30 each 0  . Biotin w/ Vitamins C & E 1250-7.5-7.5 MCG-MG-UNT CHEW Chew 2 tablets by mouth daily.    Garlan Fillers Peroxide (EAR DROPS OT) Place in ear(s) as needed.     . cholecalciferol (VITAMIN D) 1000 UNITS tablet Take 1,000 Units by mouth daily.    . Glucosamine 500 MG CAPS Take 500 mg by mouth daily.    . Ibuprofen-Famotidine (DUEXIS) 800-26.6 MG TABS Take one pill three times daily  270 tablet 3  . methimazole (TAPAZOLE) 5 MG tablet Take 5 mg by mouth every Monday, Wednesday, and Friday.     . naproxen (NAPROSYN) 500 MG tablet Take 1 tablet (500 mg total) by mouth 2 (two) times daily as needed. (Patient taking differently: Take 500 mg by mouth 2 (two) times daily as needed for mild pain or moderate pain (pain). ) 60 tablet 2  . Omega-3 Fatty Acids (FISH OIL) 1000 MG CAPS Take by mouth.     No current facility-administered medications on file prior to visit.    Allergies  Allergen Reactions  . Topamax [Topiramate]     Pain in legs, SOB, and almost passed out    Family History  Problem Relation Age of Onset  . Diabetes Mother   . Hypertension Mother   . Colon cancer Mother   . Breast cancer Mother   . Prostate cancer Father     BP 126/64 mmHg  Pulse 77  Temp(Src) 98.7 F (37.1 C) (Oral)  Ht 5\' 7"  (1.702 m)  Wt 155 lb (70.308 kg)  BMI 24.27 kg/m2  SpO2 98%  Review of Systems Denies fever    Objective:   Physical Exam VITAL  SIGNS:  See vs page GENERAL: no distress NECK: thyroid is slightly and diffusely enlarged.    Lab Results  Component Value Date   TSH 1.95 09/11/2014      Assessment & Plan:  Hyperthyroidism, well-controlled Please continue the same medication. Please come back for a follow-up appointment in 6 months (no avs, due to computer system being down)

## 2014-09-19 ENCOUNTER — Ambulatory Visit (INDEPENDENT_AMBULATORY_CARE_PROVIDER_SITE_OTHER): Payer: Federal, State, Local not specified - PPO | Admitting: Family Medicine

## 2014-09-19 ENCOUNTER — Encounter: Payer: Self-pay | Admitting: Family Medicine

## 2014-09-19 VITALS — BP 114/80 | HR 73 | Ht 67.0 in | Wt 155.0 lb

## 2014-09-19 DIAGNOSIS — M999 Biomechanical lesion, unspecified: Secondary | ICD-10-CM

## 2014-09-19 DIAGNOSIS — M9902 Segmental and somatic dysfunction of thoracic region: Secondary | ICD-10-CM

## 2014-09-19 DIAGNOSIS — M9901 Segmental and somatic dysfunction of cervical region: Secondary | ICD-10-CM | POA: Diagnosis not present

## 2014-09-19 DIAGNOSIS — M940 Chondrocostal junction syndrome [Tietze]: Secondary | ICD-10-CM

## 2014-09-19 DIAGNOSIS — M9908 Segmental and somatic dysfunction of rib cage: Secondary | ICD-10-CM | POA: Diagnosis not present

## 2014-09-19 NOTE — Progress Notes (Signed)
Pre visit review using our clinic review tool, if applicable. No additional management support is needed unless otherwise documented below in the visit note. 

## 2014-09-19 NOTE — Assessment & Plan Note (Signed)
Patient continues to have some discomfort. We discussed icing regimen and home exercises. We discussed taking medications on a more regular basis at this time. Patient will do this scheduled for the next 72 hour then as needed again. Encourage her to continue everything else and stay active. Patient has any trouble with the grieving process more than will call me. Patient come back and see me again in 3-4 weeks.

## 2014-09-19 NOTE — Progress Notes (Signed)
  [name] [address] [place], [place] [id number] Phone: [phone] Subjective:     CC: Right chest wall pain follow up  [id number] Christina [name] is a 44 y.o. female coming in with complaint of right chest wall pain. Patient had what seemed to be more of a costochondral injury. Patient was doing more activities recently. Patient has been working 2 jobs full-time Patient was doing significantly better and had more of a muscle spasm around the right shoulder area. Patient had been responding very well to conservative therapy including osteopathic manipulation. Patient was taking over-the-counter natural supplementations and more icing. Patient states she is doing somewhat better and then unfortunately her mother died. Patient has not been doing the exercises as regularly. Patient also is not taking any medications regularly. Has noticed some more pain on the posterior aspect of the shoulder. Denies anything that is stopping her from her daily activities. States that she is resting fairly comfortably. Continues to remain active.      Past medical history, social, surgical and family history all reviewed in electronic medical record.   Review of Systems: No headache, visual changes, nausea, vomiting, diarrhea, constipation, dizziness, abdominal pain, skin rash, fevers, chills, night sweats, weight loss, swollen lymph nodes, body aches, joint swelling, muscle aches, chest pain, shortness of breath, mood changes.   Objective Blood pressure 114/80, pulse 73, height 5\' 7"  (1.702 m), weight 155 lb (70.308 kg), SpO2 98 %.  General: No apparent distress alert and oriented x3 mood and affect normal, dressed appropriately.  HEENT: Pupils equal, extraocular movements intact  Respiratory: Patient's speak in full sentences and does not appear short of breath  Cardiovascular: No lower extremity edema, non tender, no erythema  Skin: Warm dry intact with no signs of  infection or rash on extremities or on axial skeleton.  Abdomen: Soft nontender  Neuro: Cranial nerves II through XII are intact, neurovascularly intact in all extremities with 2+ DTRs and 2+ pulses.  Lymph: No lymphadenopathy of posterior or anterior cervical chain or axillae bilaterally.  Gait normal with good balance and coordination.  MSK:  Non tender with full range of motion and good stability and symmetric strength and tone of shoulders, elbows, wrist, hip, knee and ankles bilaterally.  Nontender over the chest wall which is an improvement Muscle spasm of the right trapezius and rhomboid noted today.  Osteopathic findings C4 flexed rotated and side bent left T3 extended rotated and side bent right with inhaled third rib T6 extended rotated and side bent right with inhaled 6 rib Significant tightness still on the right trapezius with trigger points noted.     Impression and Recommendations:     This case required medical decision making of moderate complexity.

## 2014-09-19 NOTE — Assessment & Plan Note (Signed)
Decision today to treat with OMT was based on Physical Exam  After verbal consent patient was treated with HVLA, ME, FPR techniques in cervical, thoracic and rib areas  Patient tolerated the procedure well with improvement in symptoms  Patient given exercises, stretches and lifestyle modifications  See medications in patient instructions if given  Patient will follow up in 3-6 weeks

## 2014-09-19 NOTE — Patient Instructions (Addendum)
Good to see you Muscle relaxer at night especially next 3 nights then as needed Duexis 3 times a day for 3 days as well.  I am here for you and sorry about your mom See me again in in 3-4 weeks.

## 2014-10-10 ENCOUNTER — Encounter: Payer: Self-pay | Admitting: Family Medicine

## 2014-10-10 ENCOUNTER — Ambulatory Visit (INDEPENDENT_AMBULATORY_CARE_PROVIDER_SITE_OTHER): Payer: Federal, State, Local not specified - PPO | Admitting: Family Medicine

## 2014-10-10 VITALS — BP 114/82 | HR 73 | Ht 67.0 in | Wt 158.0 lb

## 2014-10-10 DIAGNOSIS — M940 Chondrocostal junction syndrome [Tietze]: Secondary | ICD-10-CM | POA: Diagnosis not present

## 2014-10-10 DIAGNOSIS — M9908 Segmental and somatic dysfunction of rib cage: Secondary | ICD-10-CM

## 2014-10-10 DIAGNOSIS — M999 Biomechanical lesion, unspecified: Secondary | ICD-10-CM

## 2014-10-10 DIAGNOSIS — M9902 Segmental and somatic dysfunction of thoracic region: Secondary | ICD-10-CM

## 2014-10-10 DIAGNOSIS — M9901 Segmental and somatic dysfunction of cervical region: Secondary | ICD-10-CM | POA: Diagnosis not present

## 2014-10-10 NOTE — Assessment & Plan Note (Addendum)
>>  ASSESSMENT AND PLAN FOR SLIPPED RIB SYNDROME WRITTEN ON 10/10/2014 12:01 PM BY Antoine Primas M, DO  Patient is doing very well at this time. Patient has anti-inflammatory knee. We discussed icing regimen and home exercises. We discussed which activities to continues to do. Patient was given some phase II exercises and we discussed ergonomics and lifting mechanics. Patient will see me again in 5-6 weeks. Had some abnormal reaction to the muscle relaxer.  >>ASSESSMENT AND PLAN FOR NONALLOPATHIC LESION-RIB CAGE WRITTEN ON 10/10/2014 12:01 PM BY Antoine Primas M, DO  Decision today to treat with OMT was based on Physical Exam  After verbal consent patient was treated with HVLA, ME, FPR techniques in cervical, thoracic and rib areas  Patient tolerated the procedure well with improvement in symptoms  Patient given exercises, stretches and lifestyle modifications  See medications in patient instructions if given  Patient will follow up in 5-6 weeks

## 2014-10-10 NOTE — Patient Instructions (Addendum)
Wonderful to see you.  Massage every other week max.  Ice is your friend Continue to stay active and do the exercises No real big changes Look into "bed of nails" See me again 5-6 weeks.

## 2014-10-10 NOTE — Assessment & Plan Note (Signed)
Decision today to treat with OMT was based on Physical Exam  After verbal consent patient was treated with HVLA, ME, FPR techniques in cervical, thoracic and rib areas  Patient tolerated the procedure well with improvement in symptoms  Patient given exercises, stretches and lifestyle modifications  See medications in patient instructions if given  Patient will follow up in 5-6 weeks        

## 2014-10-10 NOTE — Progress Notes (Signed)
  [name] [address] [place], [place] [address] Phone: [phone] Subjective:     CC: Right chest wall pain follow up  NOM:[id number] Christina [name] is a 44 y.o. female coming in with complaint of right chest wall pain. Patient had what seemed to be more of a costochondral injury. Doing much better at this time.  Patient states the right trapezius is doing significantly better as well. Patient has been doing the exercises fairly religiously. Denies any new symptoms. Has been working more not getting worse. Patient is happy with the results at this time.       Past medical history, social, surgical and family history all reviewed in electronic medical record.   Review of Systems: No headache, visual changes, nausea, vomiting, diarrhea, constipation, dizziness, abdominal pain, skin rash, fevers, chills, night sweats, weight loss, swollen lymph nodes, body aches, joint swelling, muscle aches, chest pain, shortness of breath, mood changes.   Objective Blood pressure 114/82, pulse 73, height 5\' 7"  (1.702 m), weight 158 lb (71.668 kg), SpO2 99 %.  General: No apparent distress alert and oriented x3 mood and affect normal, dressed appropriately.  HEENT: Pupils equal, extraocular movements intact  Respiratory: Patient's speak in full sentences and does not appear short of breath  Cardiovascular: No lower extremity edema, non tender, no erythema  Skin: Warm dry intact with no signs of infection or rash on extremities or on axial skeleton.  Abdomen: Soft nontender  Neuro: Cranial nerves II through XII are intact, neurovascularly intact in all extremities with 2+ DTRs and 2+ pulses.  Lymph: No lymphadenopathy of posterior or anterior cervical chain or axillae bilaterally.  Gait normal with good balance and coordination.  MSK:  Non tender with full range of motion and good stability and symmetric strength and tone of shoulders, elbows, wrist, hip, knee and  ankles bilaterally.  Nontender over the trapezius.   Osteopathic findings C4 flexed rotated and side bent left T3 extended rotated and side bent right with inhaled third rib T6 extended rotated and side bent right with inhaled 6 rib No trigger points noted     Impression and Recommendations:     This case required medical decision making of moderate complexity.

## 2014-10-10 NOTE — Progress Notes (Signed)
Pre visit review using our clinic review tool, if applicable. No additional management support is needed unless otherwise documented below in the visit note. 

## 2014-10-21 ENCOUNTER — Encounter: Payer: Self-pay | Admitting: Neurology

## 2014-10-21 ENCOUNTER — Ambulatory Visit (INDEPENDENT_AMBULATORY_CARE_PROVIDER_SITE_OTHER): Payer: Federal, State, Local not specified - PPO | Admitting: Neurology

## 2014-10-21 ENCOUNTER — Other Ambulatory Visit: Payer: Federal, State, Local not specified - PPO

## 2014-10-21 VITALS — BP 124/84 | HR 80 | Temp 98.1°F | Ht 67.0 in | Wt 158.4 lb

## 2014-10-21 DIAGNOSIS — G43409 Hemiplegic migraine, not intractable, without status migrainosus: Secondary | ICD-10-CM

## 2014-10-21 DIAGNOSIS — R202 Paresthesia of skin: Secondary | ICD-10-CM | POA: Diagnosis not present

## 2014-10-21 DIAGNOSIS — H539 Unspecified visual disturbance: Secondary | ICD-10-CM | POA: Diagnosis not present

## 2014-10-21 DIAGNOSIS — R200 Anesthesia of skin: Secondary | ICD-10-CM

## 2014-10-21 NOTE — Progress Notes (Signed)
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    UPDATE: Headaches are well controlled and she hasn't had recent episode of hemiplegia.  Her mother passed away from colon cancer in August.  Around that time, she began experiencing visual disturbance.  She would experience episodes of dark scotomas in the vision of both eyes, lasting one hour off and on, occuring daily.  Sometimes, she also experiences numbness, tingling or pain in her extremities, bilaterally.  As per the chart, blood work in May showed a B12 level of 220.  Recent TSH from last month was 1.95.   HISTORY: On 03/10/14, she developed sudden onset of vision loss in the left eye.  Over the next 30 minutes, she developed clumsiness and heaviness of her right arm and hand.  There was no speech or language disturbance.  This lasted a couple of hours.  Several hours later, she developed a severe 8-9/10 nonthrobbing left sided headache.  It was associated with nausea, photophobia and phonophobia.  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  MRI of brain performed on 03/11/14 was normal.  She was advised to start ASA 81mg  daily.  She received a cocktail, which eased the pain.  The headache persisted without aura symptoms at an intensity of 5/10.  Since it did not completely resolve, she returned to the ED about a week later.  She received an Imitrex injection which caused the left vision loss aura, which persisted off an on for a few days.  The headache persisted for 2 weeks before it completely resolved.  She did not have the focal loss of fine-motor  skills in the right hand.    She had other episodes presenting with left sided weakness as well.  She has had left sided symptoms involving the arm and leg, presenting as constant feeling of weakness, tingling in the wrist or ankle, and generalized aches.  Symptoms fluctuate during the day.  She denies neck pain or pain shooting down the arm or leg.  She also described an episode where she heard crackling in her ears, followed by flashing lights lasting 10 seconds.    She has had occasional recurrent episodes of headache without the visual aura and focal weakness.  It is bi-frontal and throbbing, about 5/10.  It is associated with photophobia and phonophobia, but not nausea.  She will take naproxen 500mg  and cup of coffee which eases the pain.  It will last a couple of days.  Prior medications:  topiramate (dizziness)  She has no prior history of migraine.  Her father had migraines.  There is no family history of first degree relatives with stroke  Since one of the episodes involved transient left vision loss with right sided weakness, and the left ICA showed a stenosis of 50%, referral to vascular surgery was recommended.  However, she had subsequent ED visits where she presented with left sided weakness, so I felt her symptoms were not related to the left ICA and vascular consult was cancelled.  PAST MEDICAL HISTORY: Past Medical History  Diagnosis Date  . Hyperthyroidism   . Migraines  with asthma  . Hypercholesteremia     MEDICATIONS: Current Outpatient Prescriptions on File Prior to Visit  Medication Sig Dispense Refill  . aspirin 81 MG tablet Take 81 mg by mouth daily.    . Biotin w/ Vitamins C & E 1250-7.5-7.5 MCG-MG-UNT CHEW Chew 2 tablets by mouth daily.    Garlan Fillers Peroxide (EAR DROPS OT) Place in ear(s) as needed.     . cholecalciferol (VITAMIN D) 1000 UNITS tablet Take 1,000 Units by mouth daily.    . Glucosamine 500 MG CAPS Take 500 mg by mouth daily.    .  Ibuprofen-Famotidine (DUEXIS) 800-26.6 MG TABS Take one pill three times daily 270 tablet 3  . methimazole (TAPAZOLE) 5 MG tablet Take 5 mg by mouth every Monday, Wednesday, and Friday.     . naproxen (NAPROSYN) 500 MG tablet Take 1 tablet (500 mg total) by mouth 2 (two) times daily as needed. (Patient taking differently: Take 500 mg by mouth 2 (two) times daily as needed for mild pain or moderate pain (pain). ) 60 tablet 2  . Omega-3 Fatty Acids (FISH OIL) 1000 MG CAPS Take by mouth.     No current facility-administered medications on file prior to visit.    ALLERGIES: Allergies  Allergen Reactions  . Topamax [Topiramate]     Pain in legs, SOB, and almost passed out    FAMILY HISTORY: Family History  Problem Relation Age of Onset  . Diabetes Mother   . Hypertension Mother   . Colon cancer Mother   . Breast cancer Mother   . Prostate cancer Father     SOCIAL HISTORY: Social History   Social History  . Marital Status: Single    Spouse Name: N/A  . Number of Children: 1  . Years of Education: 14   Occupational History  . Mail Processor    Social History Main Topics  . Smoking status: Never Smoker   . Smokeless tobacco: Never Used  . Alcohol Use: No  . Drug Use: No  . Sexual Activity:    Partners: Male    Birth Control/ Protection: Condom   Other Topics Concern  . Not on file   Social History Narrative   Born and raised in [place]. Currently resides in a house with her child. No pets. Fun: Go to the movies.    Denies religious beliefs effecting health care.     REVIEW OF SYSTEMS: Constitutional: No fevers, chills, or sweats, no generalized fatigue, change in appetite Eyes: No visual changes, double vision, eye pain Ear, nose and throat: No hearing loss, ear pain, nasal congestion, sore throat Cardiovascular: No chest pain, palpitations Respiratory:  No shortness of breath at rest or with exertion, wheezes GastrointestinaI: No nausea, vomiting, diarrhea,  abdominal pain, fecal incontinence Genitourinary:  No dysuria, urinary retention or frequency Musculoskeletal:  No neck pain, back pain Integumentary: No rash, pruritus, skin lesions Neurological: as above Psychiatric: No depression, insomnia, anxiety Endocrine: No palpitations, fatigue, diaphoresis, mood swings, change in appetite, change in weight, increased thirst Hematologic/Lymphatic:  No anemia, purpura, petechiae. Allergic/Immunologic: no itchy/runny eyes, nasal congestion, recent allergic reactions, rashes  PHYSICAL EXAM: Filed Vitals:   10/21/14 1409  BP: 124/84  Pulse: 80  Temp: 98.1 F (36.7 C)   General: No acute distress.  Patient appears well-groomed.   Head:  Normocephalic/atraumatic Eyes:  Fundoscopic exam unremarkable without vessel changes, exudates, hemorrhages or papilledema. Neck: supple, no paraspinal tenderness, full range of motion Heart:  Regular rate and rhythm Lungs:  Clear to auscultation bilaterally Back: No paraspinal tenderness Neurological Exam: alert and oriented to person, place, and time. Attention span and concentration intact, recent and remote memory intact, fund of knowledge intact.  Speech fluent and not dysarthric, language intact.  CN II-XII intact. Fundoscopic exam unremarkable without vessel changes, exudates, hemorrhages or papilledema.  Bulk and tone normal, muscle strength 5/5 throughout.  Sensation to light touch, temperature and vibration intact.  Deep tendon reflexes 2+ throughout, toes downgoing.  Finger to nose and heel to shin testing intact.  Gait normal, Romberg negative.  IMPRESSION: Episodic pain, numbness and tingling of extremities. Episodic visual disturbance These symptoms do not seem like migraine aura.  I would consider somatization.  She denies anxiety but she did lose her mother in August, who had been battling colon cancer for 2 years.  Hemiplegic migraine is possible, however, given that she had unilateral weakness on  either side, and her other symptoms, consider this to be psychogenic too.  PLAN: 1.  B12 level was borderline low in May.  Deficiency may be a cause for numbness and tingling.  Will recheck B12 level with methylmalonic acid level.  If low or borderline-low, would recommend supplementation. 2.  Follow up in 3 months.  15 minutes spent face to face with patient, over 50% spent discussing diagnosis and management.  Metta Clines, DO  CC:  Mauricio Po, FNP

## 2014-10-21 NOTE — Patient Instructions (Signed)
I don't think that the visual symptoms or numbness and pain are related to migraine.  It may be related to anxiety, even though you might not realize it.  Since your B12 was borderline low before, we will recheck it, because low B12 may cause numbness.  Otherwise, follow up in 3 months.

## 2014-10-22 LAB — VITAMIN B12: VITAMIN B 12: 226 pg/mL (ref 211–911)

## 2014-10-24 ENCOUNTER — Telehealth: Payer: Self-pay | Admitting: Neurology

## 2014-10-24 NOTE — Telephone Encounter (Signed)
Pt called for recent lab results/ call back @ [phone]

## 2014-10-24 NOTE — Telephone Encounter (Signed)
Called and patient a message that not all results are back yet. Will contact her when Dr Tomi Likens sends me the results

## 2014-10-28 ENCOUNTER — Telehealth: Payer: Self-pay | Admitting: Neurology

## 2014-10-28 LAB — METHYLMALONIC ACID(MMA), RND URINE
Creatinine: 14.58 mmol/L (ref 2.38–26.55)
Methylmalonic acid: 1.2 mmol/mol{creat} (ref 0.3–1.9)

## 2014-10-28 NOTE — Telephone Encounter (Signed)
Pt called for recent Lab Results/ call back @ [phone]

## 2014-10-28 NOTE — Telephone Encounter (Signed)
Notes Recorded by Oda Kilts, CMA on 10/25/2014 at 1:32 PM L/M for patient to return call Notes Recorded by Pieter Partridge, DO on 10/25/2014 at 9:13 AM B12 level is still on the low-normal side. I would start supplementation (B12 106mcg daily)   Went over B12 results and recommendations with patient and explained that the other test is still showing in process we will call her with results when Dr Tomi Likens sees them

## 2014-11-20 ENCOUNTER — Encounter: Payer: Self-pay | Admitting: Family Medicine

## 2014-11-20 ENCOUNTER — Ambulatory Visit (INDEPENDENT_AMBULATORY_CARE_PROVIDER_SITE_OTHER): Payer: Federal, State, Local not specified - PPO | Admitting: Family Medicine

## 2014-11-20 ENCOUNTER — Ambulatory Visit: Payer: Federal, State, Local not specified - PPO | Admitting: Family Medicine

## 2014-11-20 VITALS — BP 130/82 | HR 72 | Ht 67.0 in | Wt 158.0 lb

## 2014-11-20 DIAGNOSIS — M999 Biomechanical lesion, unspecified: Secondary | ICD-10-CM

## 2014-11-20 DIAGNOSIS — M940 Chondrocostal junction syndrome [Tietze]: Secondary | ICD-10-CM

## 2014-11-20 DIAGNOSIS — M9901 Segmental and somatic dysfunction of cervical region: Secondary | ICD-10-CM | POA: Diagnosis not present

## 2014-11-20 DIAGNOSIS — E0590 Thyrotoxicosis, unspecified without thyrotoxic crisis or storm: Secondary | ICD-10-CM | POA: Diagnosis not present

## 2014-11-20 DIAGNOSIS — M9908 Segmental and somatic dysfunction of rib cage: Secondary | ICD-10-CM

## 2014-11-20 DIAGNOSIS — M9902 Segmental and somatic dysfunction of thoracic region: Secondary | ICD-10-CM | POA: Diagnosis not present

## 2014-11-20 MED ORDER — VITAMIN D (ERGOCALCIFEROL) 1.25 MG (50000 UNIT) PO CAPS: 50000.0000 [IU] | ORAL_CAPSULE | ORAL | Status: DC

## 2014-11-20 NOTE — Assessment & Plan Note (Signed)
Continues to respond well to manipulation therapy. We discussed icing regimen and continuing the postural control. Patient is very active and encourage her to continue to do so. His lungs patient continues to do well we will continue to monitor. Patient will be spaced out to 6 week intervals.

## 2014-11-20 NOTE — Assessment & Plan Note (Signed)
Decision today to treat with OMT was based on Physical Exam  After verbal consent patient was treated with HVLA, ME, FPR techniques in cervical, thoracic and rib areas  Patient tolerated the procedure well with improvement in symptoms  Patient given exercises, stretches and lifestyle modifications  See medications in patient instructions if given  Patient will follow up in 5-6 weeks        

## 2014-11-20 NOTE — Patient Instructions (Addendum)
Good to see you Give your self  Iron 65mg  daily B complex would be good.  B12 1042mcg daily Vitamin D 50000 weekly for next 3 months.  See me again in 4-6 weeks.

## 2014-11-20 NOTE — Assessment & Plan Note (Signed)
Discussed with patient having increasing fatigue and if no significant improvement in 6 weeks we may want to consider repeat labs.

## 2014-11-20 NOTE — Progress Notes (Signed)
  [name] [address] [place], [place] [address] Phone: [phone] Subjective:     CC: Right chest wall pain follow up  [id number] Christina [name] is a 44 y.o. female coming in with complaint of right chest wall pain. Patient had what seemed to be more of a costochondral injury. Doing much better at this time.  Patient states the right trapezius is doing significantly better as well. Patient states that she's had some mild tightness of the upper back after doing a lot of lifting but nothing significant. Has noticed some mild radicular symptoms in all her extremities but very mild. No some weight gain. Does have a history of hyperthyroidism. Last labs were fairly unremarkable. Other than some fatigue no other significant complaints.       Past medical history, social, surgical and family history all reviewed in electronic medical record.   Review of Systems: No headache, visual changes, nausea, vomiting, diarrhea, constipation, dizziness, abdominal pain, skin rash, fevers, chills, night sweats, weight loss, swollen lymph nodes, body aches, joint swelling, muscle aches, chest pain, shortness of breath, mood changes.   Objective Blood pressure 130/82, pulse 72, height 5\' 7"  (1.702 m), weight 158 lb (71.668 kg), SpO2 98 %.  General: No apparent distress alert and oriented x3 mood and affect normal, dressed appropriately.  HEENT: Pupils equal, extraocular movements intact  Respiratory: Patient's speak in full sentences and does not appear short of breath  Cardiovascular: No lower extremity edema, non tender, no erythema  Skin: Warm dry intact with no signs of infection or rash on extremities or on axial skeleton.  Abdomen: Soft nontender  Neuro: Cranial nerves II through XII are intact, neurovascularly intact in all extremities with 2+ DTRs and 2+ pulses.  Lymph: No lymphadenopathy of posterior or anterior cervical chain or axillae bilaterally.  Gait  normal with good balance and coordination.  MSK:  Non tender with full range of motion and good stability and symmetric strength and tone of shoulders, elbows, wrist, hip, knee and ankles bilaterally.  Nontender over the trapezius.   Osteopathic findings C4 flexed rotated and side bent left C7 flexed rotated and side bent right T3 extended rotated and side bent right with inhaled third rib T6 extended rotated and side bent right with inhaled 6 rib      Impression and Recommendations:     This case required medical decision making of moderate complexity.

## 2014-11-20 NOTE — Progress Notes (Signed)
Pre visit review using our clinic review tool, if applicable. No additional management support is needed unless otherwise documented below in the visit note. 

## 2014-12-23 ENCOUNTER — Telehealth: Payer: Self-pay | Admitting: Endocrinology

## 2014-12-23 MED ORDER — METHIMAZOLE 5 MG PO TABS: 5.0000 mg | ORAL_TABLET | ORAL | Status: DC

## 2014-12-23 NOTE — Telephone Encounter (Signed)
Rx submitted per pt's request.  

## 2014-12-23 NOTE — Telephone Encounter (Signed)
Patient need refill of methimazole (TAPAZOLE) 5 MG tablet,  [facility] - [place], [place] [address] [phone] (Phone) [phone] (Fax)

## 2014-12-27 ENCOUNTER — Other Ambulatory Visit (INDEPENDENT_AMBULATORY_CARE_PROVIDER_SITE_OTHER): Payer: Federal, State, Local not specified - PPO

## 2014-12-27 ENCOUNTER — Ambulatory Visit (INDEPENDENT_AMBULATORY_CARE_PROVIDER_SITE_OTHER): Payer: Federal, State, Local not specified - PPO | Admitting: Family

## 2014-12-27 ENCOUNTER — Encounter: Payer: Self-pay | Admitting: Family

## 2014-12-27 VITALS — BP 138/88 | HR 82 | Temp 98.4°F | Resp 18 | Ht 67.0 in | Wt 158.0 lb

## 2014-12-27 DIAGNOSIS — E0590 Thyrotoxicosis, unspecified without thyrotoxic crisis or storm: Secondary | ICD-10-CM

## 2014-12-27 DIAGNOSIS — M25572 Pain in left ankle and joints of left foot: Secondary | ICD-10-CM

## 2014-12-27 LAB — TSH: TSH: 2.4 u[IU]/mL (ref 0.35–4.50)

## 2014-12-27 NOTE — Assessment & Plan Note (Signed)
Questions of fatigue regarding the hyperthyroidism and maintained on methimazole. Obtain TSH to determine current status. Continue current dosage of methimazole pending TSH results.

## 2014-12-27 NOTE — Progress Notes (Signed)
Subjective:    Patient ID: Christina [name]. Christina [name], female    DOB: 09-09-1970, 44 y.o.   MRN: [id number]  Chief Complaint  Patient presents with  . Foot Pain    x1 month, left foot has a burning sensation that happens 4 times a day and sometimes comes with a pain that goes up her left leg    HPI:  Christina [name] is a 44 y.o. female who  has a past medical history of Hyperthyroidism; Migraines; and Hypercholesteremia. and presents today for an acute office visit.   1.) Left ankle  - Associated symptom of pain located in her left foot/ankle that is described as a burning/hot sensation occurs about 4 times per day which has been going on for several months. Previously noted to have numbness in her toes which has improved with adjustments of supplementation of vitamins. Standing for long periods of time make it worse. There are no modifying factors that make it better. Does have history of inversion ankle sprain around 4 months ago. Denies additional traumas or  Sounds/sensastions.  2.) Hyperthyroidism - currently maintained on methimazole. Takes medications prescribed and denies adverse side effects, however does note increased fatigue recently. Is mild concern for increasing TSH. Would like to have TSH levels checked.   Allergies  Allergen Reactions  . Topamax [Topiramate]     Pain in legs, SOB, and almost passed out     Current Outpatient Prescriptions on File Prior to Visit  Medication Sig Dispense Refill  . aspirin 81 MG tablet Take 81 mg by mouth daily.    Garlan Fillers Peroxide (EAR DROPS OT) Place in ear(s) as needed.     . Ibuprofen-Famotidine (DUEXIS) 800-26.6 MG TABS Take one pill three times daily 270 tablet 3   No current facility-administered medications on file prior to visit.    Past Medical History  Diagnosis Date  . Hyperthyroidism   . Migraines     with asthma  . Hypercholesteremia      Past Surgical History  Procedure Laterality Date  . Breast biopsy Right 06-26-14      benign per patient     Review of Systems  Constitutional: Negative for fever and chills.  Musculoskeletal:       Positive for left ankle pain.       Objective:    BP 138/88 mmHg  Pulse 82  Temp(Src) 98.4 F (36.9 C) (Oral)  Resp 18  Ht 5\' 7"  (1.702 m)  Wt 158 lb (71.668 kg)  BMI 24.74 kg/m2  SpO2 99% Nursing note and vital signs reviewed.  Physical Exam  Constitutional: She is oriented to person, place, and time. She appears well-developed and well-nourished. No distress.  Cardiovascular: Normal rate, regular rhythm, normal heart sounds and intact distal pulses.   Pulmonary/Chest: Effort normal and breath sounds normal.  Musculoskeletal:  Left ankle - no obvious deformity, discoloration, or edema present. No palpable tenderness able to be elicited. Range of motion is normal in all directions passively and actively. Strength is 5+ in all directions. Positive anterior drawer for laxity. Positive talar tilt for laxity. Pulses and sensation are intact and appropriate.  Neurological: She is alert and oriented to person, place, and time.  Skin: Skin is warm and dry.  Psychiatric: She has a normal mood and affect. Her behavior is normal. Judgment and thought content normal.       Assessment & Plan:   Problem List Items Addressed This Visit      Endocrine  Hyperthyroidism - Primary    Questions of fatigue regarding the hyperthyroidism and maintained on methimazole. Obtain TSH to determine current status. Continue current dosage of methimazole pending TSH results.      Relevant Orders   TSH (Completed)     Other   Left ankle pain    Left ankle pain of questionable origin although based on current symptoms most likely related to neuropathy, however cannot rule out residual from previous ankle sprain. Continue conservative therapy with home exercise to strengthen ankle. Continue supplementation. Follow-up if symptoms worsen or fail to improve with conservative therapy.

## 2014-12-27 NOTE — Assessment & Plan Note (Signed)
Left ankle pain of questionable origin although based on current symptoms most likely related to neuropathy, however cannot rule out residual from previous ankle sprain. Continue conservative therapy with home exercise to strengthen ankle. Continue supplementation. Follow-up if symptoms worsen or fail to improve with conservative therapy.

## 2014-12-27 NOTE — Patient Instructions (Signed)
Thank you for choosing [facility].  Summary/Instructions:  Please stop by the lab on the basement level of the building for your blood work. Your results will be released to [facility] (or called to you) after review, usually within 72 hours after test completion. If any changes need to be made, you will be notified at that same time.  If your symptoms worsen or fail to improve, please contact our office for further instruction, or in case of emergency go directly to the emergency room at the closest medical facility.    Generic Ankle Exercises EXERCISES RANGE OF MOTION (ROM) AND STRETCHING EXERCISES These exercises may help you when beginning to rehabilitate your injury. Your symptoms may resolve with or without further involvement from your physician, physical therapist or athletic trainer. While completing these exercises, remember:   Restoring tissue flexibility helps normal motion to return to the joints. This allows healthier, less painful movement and activity.  An effective stretch should be held for at least 30 seconds.  A stretch should never be painful. You should only feel a gentle lengthening or release in the stretched tissue. RANGE OF MOTION - Dorsi/Plantar Flexion  While sitting with your right / left knee straight, draw the top of your foot upwards by flexing your ankle. Then reverse the motion, pointing your toes downward.  Hold each position for __________ seconds.  After completing your first set of exercises, repeat this exercise with your knee bent. Repeat __________ times. Complete this exercise __________ times per day.  RANGE OF MOTION - Ankle Alphabet  Imagine your right / left big toe is a pen.  Keeping your hip and knee still, write out the entire alphabet with your "pen." Make the letters as large as you can without increasing any discomfort. Repeat __________ times. Complete this exercise __________ times per day.  RANGE OF MOTION - Ankle  Dorsiflexion, Active Assisted   Remove shoes and sit on a chair that is preferably not on a carpeted surface.  Place right / left foot under knee. Extend your opposite leg for support.  Keeping your heel down, slide your right / left foot back toward the chair until you feel a stretch at your ankle or calf. If you do not feel a stretch, slide your bottom forward to the edge of the chair while still keeping your heel down.  Hold this stretch for __________ seconds. Repeat __________ times. Complete this stretch __________ times per day.  STRENGTHENING EXERCISES  These exercises may help you when beginning to rehabilitate your injury. They may resolve your symptoms with or without further involvement from your physician, physical therapist or athletic trainer. While completing these exercises, remember:   Muscles can gain both the endurance and the strength needed for everyday activities through controlled exercises.  Complete these exercises as instructed by your physician, physical therapist or athletic trainer. Progress the resistance and repetitions only as guided.  You may experience muscle soreness or fatigue, but the pain or discomfort you are trying to eliminate should never worsen during these exercises. If this pain does worsen, stop and make certain you are following the directions exactly. If the pain is still present after adjustments, discontinue the exercise until you can discuss the trouble with your clinician. STRENGTH - Dorsiflexors  Secure a rubber exercise band/tubing to a fixed object (table, pole) and loop the other end around your right / left foot.  Sit on the floor facing the fixed object. The band/tubing should be slightly tense when your  foot is relaxed.  Slowly draw your foot back toward you using your ankle and toes.  Hold this position for __________ seconds. Slowly release the tension in the band and return your foot to the starting position. Repeat __________  times. Complete this exercise __________ times per day.  STRENGTH - Plantar-flexors  Sit with your right / left leg extended. Holding onto both ends of a rubber exercise band/tubing, loop it around the ball of your foot. Keep a slight tension in the band.  Slowly push your toes away from you, pointing them downward.  Hold this position for __________ seconds. Return slowly, controlling the tension in the band/tubing. Repeat __________ times. Complete this exercise __________ times per day.  STRENGTH - Ankle Eversion  Secure one end of a rubber exercise band/tubing to a fixed object (table, pole). Loop the other end around your foot just before your toes.  Place your fists between your knees. This will focus your strengthening at your ankle.  Drawing the band/tubing across your opposite foot, slowly, pull your little toe out and up. Make sure the band/tubing is positioned to resist the entire motion.  Hold this position for __________ seconds.  Have your muscles resist the band/tubing as it slowly pulls your foot back to the starting position. Repeat __________ times. Complete this exercise __________ times per day.  STRENGTH - Ankle Inversion  Secure one end of a rubber exercise band/tubing to a fixed object (table, pole). Loop the other end around your foot just before your toes.  Place your fists between your knees. This will focus your strengthening at your ankle.  Slowly, pull your big toe up and in, making sure the band/tubing is positioned to resist the entire motion.  Hold this position for __________ seconds.  Have your muscles resist the band/tubing as it slowly pulls your foot back to the starting position. Repeat __________ times. Complete this exercises __________ times per day.  STRENGTH - Towel Curls  Sit in a chair positioned on a non-carpeted surface.  Place your foot on a towel, keeping your heel on the floor.  Pull the towel toward your heel by only curling  your toes. Keep your heel on the floor. If instructed by your physician, physical therapist or athletic trainer, add weight to the end of the towel. Repeat __________ times. Complete this exercise __________ times per day. STRENGTH - Plantar-flexors, Standing  Stand with your feet shoulder width apart. Steady yourself with a wall or table using as little support as needed.  Keeping your weight evenly spread over the width of your feet, rise up on your toes.*  Hold this position for __________ seconds. Repeat __________ times. Complete this exercise __________ times per day.  *If this is too easy, shift your weight toward your right / left leg until you feel challenged. Ultimately, you may be asked to do this exercise with your right / left foot only. BALANCE - Tandem Walking  Place your uninjured foot on a line 2-4 inches wide and at least 10 feet long.  Keeping your balance without using anything for extra support, place your right / left heel directly in front of your other foot.  Slowly raise your back foot up, lifting from the heel to the toes, and place it directly in front of the right / left foot.  Continue to walk along the line slowly. Walk for ____________________ feet. Repeat ____________________ times. Complete ____________________ times per day.   This information is not intended to replace advice  given to you by your health care provider. Make sure you discuss any questions you have with your health care provider.   Document Released: 11/25/2004 Document Revised: 02/01/2014 Document Reviewed: 04/25/2008 Elsevier Interactive Patient Education [employer].

## 2014-12-27 NOTE — Progress Notes (Signed)
Pre visit review using our clinic review tool, if applicable. No additional management support is needed unless otherwise documented below in the visit note. 

## 2015-01-01 ENCOUNTER — Encounter: Payer: Self-pay | Admitting: Family Medicine

## 2015-01-01 ENCOUNTER — Ambulatory Visit (INDEPENDENT_AMBULATORY_CARE_PROVIDER_SITE_OTHER): Payer: Federal, State, Local not specified - PPO | Admitting: Family Medicine

## 2015-01-01 VITALS — BP 112/78 | HR 77 | Ht 67.0 in | Wt 159.0 lb

## 2015-01-01 DIAGNOSIS — M940 Chondrocostal junction syndrome [Tietze]: Secondary | ICD-10-CM | POA: Diagnosis not present

## 2015-01-01 DIAGNOSIS — M999 Biomechanical lesion, unspecified: Secondary | ICD-10-CM

## 2015-01-01 DIAGNOSIS — M9901 Segmental and somatic dysfunction of cervical region: Secondary | ICD-10-CM | POA: Diagnosis not present

## 2015-01-01 NOTE — Assessment & Plan Note (Signed)
Doing significantly better at this time. Encourage her to continue with the scapular stabilization techniques. We discussed lifting mechanics. Patient does work at the [employer] and we discussed with her about proper lifting technique. Patient has different medications for breakthrough pain. We discussed icing regimen. Patient will come back and see me again in 6-8 weeks for further evaluation and treatment.

## 2015-01-01 NOTE — Patient Instructions (Signed)
Verbal instructions given

## 2015-01-01 NOTE — Progress Notes (Signed)
Pre visit review using our clinic review tool, if applicable. No additional management support is needed unless otherwise documented below in the visit note. 

## 2015-01-01 NOTE — Progress Notes (Signed)
  [name] [address] [place], [place] [address] Phone: [phone] Subjective:     CC: Right chest wall pain follow up  [id number] [name] [name] is a 44 y.o. female coming in with complaint of right chest wall pain. Patient had what seemed to be more of a costochondral injury. Patient was having more of a slipped rib syndrome. Has done significantly better. States that she is feeling stronger and stronger. Noticing that she is not getting as fatigue. Patient had also been treated for more of a anemia. States that she has noticed more energy. Less pain overall. She is doing more activity feeling better every day.       Past medical history, social, surgical and family history all reviewed in electronic medical record.   Review of Systems: No headache, visual changes, nausea, vomiting, diarrhea, constipation, dizziness, abdominal pain, skin rash, fevers, chills, night sweats, weight loss, swollen lymph nodes, body aches, joint swelling, muscle aches, chest pain, shortness of breath, mood changes.   Objective Blood pressure 112/78, pulse 77, height 5\' 7"  (1.702 m), weight 159 lb (72.122 kg), SpO2 98 %.  General: No apparent distress alert and oriented x3 mood and affect normal, dressed appropriately.  HEENT: Pupils equal, extraocular movements intact  Respiratory: Patient's speak in full sentences and does not appear short of breath  Cardiovascular: No lower extremity edema, non tender, no erythema  Skin: Warm dry intact with no signs of infection or rash on extremities or on axial skeleton.  Abdomen: Soft nontender  Neuro: Cranial nerves II through XII are intact, neurovascularly intact in all extremities with 2+ DTRs and 2+ pulses.  Lymph: No lymphadenopathy of posterior or anterior cervical chain or axillae bilaterally.  Gait normal with good balance and coordination.  MSK:  Non tender with full range of motion and good stability and symmetric  strength and tone of shoulders, elbows, wrist, hip, knee and ankles bilaterally.  Nontender over the trapezius. Patient has great range of motion of the neck. Full strength of the shoulders bilaterally. Neurovascular intact in all extremity.  Osteopathic findings C4 flexed rotated and side bent left C7 flexed rotated and side bent right T3 extended rotated and side bent right with inhaled third rib T6 extended rotated and side bent right L2 flexed rotated and side bent right      Impression and Recommendations:     This case required medical decision making of moderate complexity.

## 2015-01-01 NOTE — Assessment & Plan Note (Signed)
Decision today to treat with OMT was based on Physical Exam  After verbal consent patient was treated with HVLA, ME, FPR techniques in cervical, thoracic and rib areas  Patient tolerated the procedure well with improvement in symptoms  Patient given exercises, stretches and lifestyle modifications  See medications in patient instructions if given  Patient will follow up in 6-8 weeks                  

## 2015-02-12 ENCOUNTER — Ambulatory Visit (INDEPENDENT_AMBULATORY_CARE_PROVIDER_SITE_OTHER): Payer: Federal, State, Local not specified - PPO | Admitting: Neurology

## 2015-02-12 ENCOUNTER — Encounter: Payer: Self-pay | Admitting: Neurology

## 2015-02-12 ENCOUNTER — Ambulatory Visit (INDEPENDENT_AMBULATORY_CARE_PROVIDER_SITE_OTHER): Payer: Federal, State, Local not specified - PPO | Admitting: Family Medicine

## 2015-02-12 ENCOUNTER — Encounter: Payer: Self-pay | Admitting: Family Medicine

## 2015-02-12 VITALS — BP 118/76 | HR 76 | Wt 162.0 lb

## 2015-02-12 VITALS — BP 118/76 | HR 68 | Ht 67.0 in | Wt 162.0 lb

## 2015-02-12 DIAGNOSIS — M940 Chondrocostal junction syndrome [Tietze]: Secondary | ICD-10-CM | POA: Diagnosis not present

## 2015-02-12 DIAGNOSIS — R200 Anesthesia of skin: Secondary | ICD-10-CM

## 2015-02-12 DIAGNOSIS — I6522 Occlusion and stenosis of left carotid artery: Secondary | ICD-10-CM

## 2015-02-12 DIAGNOSIS — G43109 Migraine with aura, not intractable, without status migrainosus: Secondary | ICD-10-CM | POA: Diagnosis not present

## 2015-02-12 DIAGNOSIS — R202 Paresthesia of skin: Secondary | ICD-10-CM

## 2015-02-12 DIAGNOSIS — M9908 Segmental and somatic dysfunction of rib cage: Secondary | ICD-10-CM

## 2015-02-12 DIAGNOSIS — M999 Biomechanical lesion, unspecified: Secondary | ICD-10-CM

## 2015-02-12 DIAGNOSIS — G43409 Hemiplegic migraine, not intractable, without status migrainosus: Secondary | ICD-10-CM

## 2015-02-12 DIAGNOSIS — E0590 Thyrotoxicosis, unspecified without thyrotoxic crisis or storm: Secondary | ICD-10-CM

## 2015-02-12 DIAGNOSIS — M9902 Segmental and somatic dysfunction of thoracic region: Secondary | ICD-10-CM

## 2015-02-12 DIAGNOSIS — M9901 Segmental and somatic dysfunction of cervical region: Secondary | ICD-10-CM | POA: Diagnosis not present

## 2015-02-12 NOTE — Patient Instructions (Signed)
I am glad you are doing well Continue vitamins Will get carotid ultrasound to look at the carotid artery again Follow up in one year

## 2015-02-12 NOTE — Assessment & Plan Note (Signed)
Decision today to treat with OMT was based on Physical Exam  After verbal consent patient was treated with HVLA, ME, FPR techniques in cervical, thoracic and rib areas  Patient tolerated the procedure well with improvement in symptoms  Patient given exercises, stretches and lifestyle modifications  See medications in patient instructions if given  Patient will follow up in 6-8 weeks             Decision today to treat with OMT was based on Physical Exam  After verbal consent patient was treated with HVLA, ME, FPR techniques in cervical, thoracic and rib areas  Patient tolerated the procedure well with improvement in symptoms  Patient given exercises, stretches and lifestyle modifications  See medications in patient instructions if given  Patient will follow up in 6-8 weeks

## 2015-02-12 NOTE — Assessment & Plan Note (Signed)
Patient is making significant strides at this time. Patient is doing very well overall. We discussed continuing the same regimen at this time. We discussed continuing to focus on the postural changes that I think be beneficial. We discussed the icing regimen. Patient has topical anti-inflammatories as well as oral anti-inflammatories for any breakthrough pain. Patient has responded very well to manipulation and I do think following up in 6-8 weeks could be beneficial.

## 2015-02-12 NOTE — Progress Notes (Signed)
  [name] [place], [place] [address] Phone: [phone] Subjective:     CC: Right chest wall pain follow up  [id number] Christina [name] is a 45 y.o. female coming in with complaint of right chest wall pain. Patient had what seemed to be more of a costochondral injury. Patient was having more of a slipped rib syndrome.  Overall patient has been feeling great. Has had some mild tightness of the upper back over the course last weeks but otherwise feeling better. Patient states that the numbing feeling that she was having the leg has improved significant. Continues with the once weekly vitamin D and will be done after next week. Patient states overall she has made significant improvement.       Past medical history, social, surgical and family history all reviewed in electronic medical record.   Review of Systems: No headache, visual changes, nausea, vomiting, diarrhea, constipation, dizziness, abdominal pain, skin rash, fevers, chills, night sweats, weight loss, swollen lymph nodes, body aches, joint swelling, muscle aches, chest pain, shortness of breath, mood changes.   Objective Blood pressure 118/76, pulse 76, weight 162 lb (73.483 kg).  General: No apparent distress alert and oriented x3 mood and affect normal, dressed appropriately.  HEENT: Pupils equal, extraocular movements intact  Respiratory: Patient's speak in full sentences and does not appear short of breath  Cardiovascular: No lower extremity edema, non tender, no erythema  Skin: Warm dry intact with no signs of infection or rash on extremities or on axial skeleton.  Abdomen: Soft nontender  Neuro: Cranial nerves II through XII are intact, neurovascularly intact in all extremities with 2+ DTRs and 2+ pulses.  Lymph: No lymphadenopathy of posterior or anterior cervical chain or axillae bilaterally.  Gait normal with good balance and coordination.  MSK:  Non tender with full  range of motion and good stability and symmetric strength and tone of shoulders, elbows, wrist, hip, knee and ankles bilaterally.  Nontender over the trapezius. Patient has great range of motion of the neck. Full strength of the shoulders bilaterally. Neurovascular intact in all extremity.continues to do very well. Very minimal tightness of the rhomboids on the right side  Osteopathic findings C2 flexed rotated and side bent right C4 flexed rotated and side bent left C7 flexed rotated and side bent right T3 extended rotated and side bent right with inhaled third rib T6 extended rotated and side bent right L2 flexed rotated and side bent right      Impression and Recommendations:     This case required medical decision making of moderate complexity.

## 2015-02-12 NOTE — Assessment & Plan Note (Signed)
Following up with endocrinology. I do believe with patient 14 she is likely having more of a hypothyroidism at this point.

## 2015-02-12 NOTE — Patient Instructions (Signed)
To see you  Christina [name] is your friend when needed You are dong so good almost had nothing for me to do Interested to see what they do with your thyroid After the vitamin D go to once daily at 2000 IU daily If worsening pain then call me and we can do the weekly again.  See me otherwise in 6-8 weeks

## 2015-02-12 NOTE — Progress Notes (Signed)
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  B12 was 226 and methylmalonic acid level was 1.2.  She was advised to start B12 1039mcg daily.  She also has been taking B6 and vitamin D.  Paresthesias have improved.  She has not had recurrent hemiplegic migraines.  HISTORY: On 03/10/14, she developed sudden onset of vision loss in the left eye.  Over the next 30 minutes, she developed clumsiness and heaviness of her right arm and hand.  There was no speech or language disturbance.  This lasted a couple of hours.  Several hours later, she developed a severe 8-9/10 nonthrobbing left sided headache.  It was associated with nausea, photophobia and phonophobia.  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  MRI of brain performed on 03/11/14 was normal.  She was advised to start ASA 81mg  daily.  She received a cocktail, which eased the pain.  The headache persisted without aura symptoms at an intensity of 5/10.  Since it did not completely resolve, she returned to the ED about a week later.  She received an Imitrex injection which caused the left vision loss aura, which persisted off an on for a few days.  The headache persisted for 2 weeks before it completely resolved.  She did not have the focal loss of fine-motor skills in the right hand.    She had other episodes presenting with left sided weakness as well.  She has had left sided symptoms involving the arm and leg, presenting as constant feeling of weakness, tingling in the wrist or ankle, and generalized aches.  Symptoms fluctuate during the day.  She denies neck pain or pain shooting down the arm or leg.  She also described an episode where she heard crackling in her ears, followed by flashing lights lasting 10 seconds.    She has had occasional recurrent episodes of headache without the visual aura and focal weakness.  It is bi-frontal and throbbing, about 5/10.  It is associated with photophobia and phonophobia, but not nausea.  She will take naproxen 500mg  and cup of coffee which eases the pain.  It will last a couple of days.  Prior medications:  topiramate (dizziness)  She has no prior history of migraine.  Her father had migraines.  There is no family history of first degree relatives with stroke  Since one of the episodes involved transient left vision loss with right sided weakness, and the left ICA showed a stenosis of 50%, referral to vascular surgery was recommended.  However, she had subsequent ED visits where she presented with left sided weakness, so I felt her symptoms were not related to the left ICA and vascular consult was cancelled.  PAST MEDICAL HISTORY: Past Medical History  Diagnosis Date  . Hyperthyroidism   . Migraines     with asthma  . Hypercholesteremia     MEDICATIONS: Current Outpatient Prescriptions on File Prior to Visit  Medication Sig Dispense Refill  . aspirin 81 MG tablet Take 81 mg by mouth daily.    Garlan Fillers Peroxide (EAR DROPS OT) Place in ear(s) as needed.     Marland Kitchen  Iron TABS Take by mouth.    . vitamin B-12 (CYANOCOBALAMIN) 1000 MCG tablet Take 1,000 mcg by mouth daily.    . Ibuprofen-Famotidine (DUEXIS) 800-26.6 MG TABS Take one pill three times daily (Patient not taking: Reported on 02/12/2015) 270 tablet 3   No current facility-administered medications on file prior to visit.    ALLERGIES: Allergies  Allergen Reactions  . Topamax [Topiramate]     Pain in legs, SOB, and almost passed out    FAMILY HISTORY: Family History  Problem Relation Age of Onset  . Diabetes Mother   .  Hypertension Mother   . Colon cancer Mother   . Breast cancer Mother   . Prostate cancer Father     SOCIAL HISTORY: Social History   Social History  . Marital Status: Single    Spouse Name: N/A  . Number of Children: 1  . Years of Education: 14   Occupational History  . Mail Processor    Social History Main Topics  . Smoking status: Never Smoker   . Smokeless tobacco: Never Used  . Alcohol Use: No  . Drug Use: No  . Sexual Activity:    Partners: Male    Birth Control/ Protection: Condom   Other Topics Concern  . Not on file   Social History Narrative   Born and raised in [place]. Currently resides in a house with her child. No pets. Fun: Go to the movies.    Denies religious beliefs effecting health care.     REVIEW OF SYSTEMS: Constitutional: No fevers, chills, or sweats, no generalized fatigue, change in appetite Eyes: No visual changes, double vision, eye pain Ear, nose and throat: No hearing loss, ear pain, nasal congestion, sore throat Cardiovascular: No chest pain, palpitations Respiratory:  No shortness of breath at rest or with exertion, wheezes GastrointestinaI: No nausea, vomiting, diarrhea, abdominal pain, fecal incontinence Genitourinary:  No dysuria, urinary retention or frequency Musculoskeletal:  No neck pain, back pain Integumentary: No rash, pruritus, skin lesions Neurological: as above Psychiatric: No depression, insomnia, anxiety Endocrine: No palpitations, fatigue, diaphoresis, mood swings, change in appetite, change in weight, increased thirst Hematologic/Lymphatic:  No anemia, purpura, petechiae. Allergic/Immunologic: no itchy/runny eyes, nasal congestion, recent allergic reactions, rashes  PHYSICAL EXAM: Filed Vitals:   02/12/15 1529  BP: 118/76  Pulse: 68   General: No acute distress.  Patient appears well-groomed.  normal body habitus. Head:  Normocephalic/atraumatic Eyes:  Fundoscopic exam unremarkable without vessel changes,  exudates, hemorrhages or papilledema. Neck: supple, no paraspinal tenderness, full range of motion Heart:  Regular rate and rhythm Lungs:  Clear to auscultation bilaterally Back: No paraspinal tenderness Neurological Exam: alert and oriented to person, place, and time. Attention span and concentration intact, recent and remote memory intact, fund of knowledge intact.  Speech fluent and not dysarthric, language intact.  CN II-XII intact. Fundoscopic exam unremarkable without vessel changes, exudates, hemorrhages or papilledema.  Bulk and tone normal, muscle strength 5/5 throughout.  Sensation to light touch, temperature and vibration intact.  Deep tendon reflexes 2+ throughout, toes downgoing.  Finger to nose and heel to shin testing intact.  Gait normal  IMPRESSION: Hemiplegic migraine Migraine with visual aura Carotid artery stenosis  PLAN: Continue supplements and ASA 81mg  daily Will monitor carotid arteries with doppler Follow up in one year  15 minutes spent face to face with patient, over 50% spent discussing management and diagnosis.  Metta Clines, DO  CC:  Mauricio Po, NP

## 2015-02-19 ENCOUNTER — Ambulatory Visit
Admission: RE | Admit: 2015-02-19 | Discharge: 2015-02-19 | Disposition: A | Discharge status: Home / Self Care | Payer: Federal, State, Local not specified - PPO | Source: Ambulatory Visit | Attending: Neurology | Admitting: Neurology

## 2015-02-19 ENCOUNTER — Telehealth: Payer: Self-pay

## 2015-02-19 DIAGNOSIS — I6522 Occlusion and stenosis of left carotid artery: Secondary | ICD-10-CM

## 2015-02-19 NOTE — Telephone Encounter (Signed)
-----   Message from Pieter Partridge, DO sent at 02/19/2015 12:23 PM EST ----- Carotid doppler looks okay.  There is no significant narrowing of the arteries.

## 2015-02-19 NOTE — Telephone Encounter (Signed)
Message relayed to patient. Verbalized understanding and denied questions.   

## 2015-02-26 ENCOUNTER — Encounter: Payer: Self-pay | Admitting: Neurology

## 2015-03-05 ENCOUNTER — Encounter: Payer: Self-pay | Admitting: Neurology

## 2015-03-13 ENCOUNTER — Ambulatory Visit (INDEPENDENT_AMBULATORY_CARE_PROVIDER_SITE_OTHER): Payer: Federal, State, Local not specified - PPO | Admitting: Endocrinology

## 2015-03-13 ENCOUNTER — Encounter: Payer: Self-pay | Admitting: Endocrinology

## 2015-03-13 VITALS — BP 120/78 | HR 83 | Temp 98.8°F | Ht 67.0 in | Wt 163.0 lb

## 2015-03-13 DIAGNOSIS — E0590 Thyrotoxicosis, unspecified without thyrotoxic crisis or storm: Secondary | ICD-10-CM

## 2015-03-13 LAB — T4, FREE: Free T4: 0.88 ng/dL (ref 0.60–1.60)

## 2015-03-13 LAB — TSH: TSH: 2.55 u[IU]/mL (ref 0.35–4.50)

## 2015-03-13 NOTE — Patient Instructions (Addendum)
blood tests are being requested for you today.  We'll let you know about the results. if ever you have fever while taking methimazole, stop it and call us, because of the risk of a rare side-effect If you want, you can change your mind and do the radioactive iodine treatment in the future. Please come back for a follow-up appointment in 6 months.   Let's recheck the ultrasound.  you will receive a phone call, about a day and time for an appointment.

## 2015-03-13 NOTE — Progress Notes (Signed)
Subjective:    Patient ID: Christina [name]. Christina [name], female    DOB: [birth year], 45 y.o.   MRN: [id number]  HPI Pt returns for f/u of hyperthyroidism (dx'ed in early 2015, in [place]; nuc med scan showed diffuse uptake (45% at 24 hrs); US showed diffuse goiter, with multiple very small nodules; pt is unaware why tapazole was chosen as rx, but she wishes to continue). she takes tapazole as rx'ed.  pt states she feels well in general, except for fatigue and weight gain.   Past Medical History  Diagnosis Date  . Hyperthyroidism   . Migraines     with asthma  . Hypercholesteremia     Past Surgical History  Procedure Laterality Date  . Breast biopsy Right 06-26-14    benign per patient    Social History   Social History  . Marital Status: Married    Spouse Name: N/A  . Number of Children: 1  . Years of Education: 14   Occupational History  . Mail Processor    Social History Main Topics  . Smoking status: Never Smoker   . Smokeless tobacco: Never Used  . Alcohol Use: No  . Drug Use: No  . Sexual Activity:    Partners: Male    Birth Control/ Protection: Condom   Other Topics Concern  . Not on file   Social History Narrative   Born and raised in [place]. Currently resides in a house with her child. No pets. Fun: Go to the movies.    Denies religious beliefs effecting health care.     Current Outpatient Prescriptions on File Prior to Visit  Medication Sig Dispense Refill  . aspirin 81 MG tablet Take 81 mg by mouth daily.    Garlan Fillers Peroxide (EAR DROPS OT) Place in ear(s) as needed.     . Ibuprofen-Famotidine (DUEXIS) 800-26.6 MG TABS Take one pill three times daily 270 tablet 3  . Iron TABS Take by mouth.    . Pyridoxine HCl (B-6) 100 MG TABS Take by mouth.    . vitamin B-12 (CYANOCOBALAMIN) 1000 MCG tablet Take 1,000 mcg by mouth daily.     No current facility-administered medications on file prior to visit.    Allergies  Allergen Reactions  . Topamax [Topiramate]     Pain in legs, SOB, and almost passed out    Family History  Problem Relation Age of Onset  . Diabetes Mother   . Hypertension Mother   . Colon cancer Mother   . Breast cancer Mother   . Prostate cancer Father     BP 120/78 mmHg  Pulse 83  Temp(Src) 98.8 F (37.1 C) (Oral)  Ht 5\' 7"  (1.702 m)  Wt 163 lb (73.936 kg)  BMI 25.52 kg/m2  SpO2 98%    Review of Systems Denies fever    Objective:   Physical Exam VITAL SIGNS:  See vs page GENERAL: no distress NECK: thyroid is slightly and diffusely enlarged.    Lab Results  Component Value Date   TSH 2.55 03/13/2015      Assessment & Plan:  Hyperthyroidism: well-controlled.  Goiter: due for recheck.    Patient is advised the following: Patient Instructions  blood tests are being requested for you today.  We'll let you know about the results. if ever you have fever while taking methimazole, stop it and call us, because of the risk of a rare side-effect If you want, you can change your mind and do the radioactive iodine treatment in  the future. Please come back for a follow-up appointment in 6 months.   Let's recheck the ultrasound.  you will receive a phone call, about a day and time for an appointment.    addendum: Please continue the same tapazole.

## 2015-03-18 ENCOUNTER — Ambulatory Visit
Admission: RE | Admit: 2015-03-18 | Discharge: 2015-03-18 | Disposition: A | Discharge status: Home / Self Care | Payer: Federal, State, Local not specified - PPO | Source: Ambulatory Visit | Attending: Endocrinology | Admitting: Endocrinology

## 2015-03-18 DIAGNOSIS — E0590 Thyrotoxicosis, unspecified without thyrotoxic crisis or storm: Secondary | ICD-10-CM

## 2015-03-26 ENCOUNTER — Ambulatory Visit: Payer: Federal, State, Local not specified - PPO | Admitting: Family Medicine

## 2015-04-02 ENCOUNTER — Ambulatory Visit (INDEPENDENT_AMBULATORY_CARE_PROVIDER_SITE_OTHER): Payer: Federal, State, Local not specified - PPO | Admitting: Family Medicine

## 2015-04-02 ENCOUNTER — Encounter: Payer: Self-pay | Admitting: Family Medicine

## 2015-04-02 VITALS — BP 116/80 | HR 71 | Wt 163.0 lb

## 2015-04-02 DIAGNOSIS — M9901 Segmental and somatic dysfunction of cervical region: Secondary | ICD-10-CM

## 2015-04-02 DIAGNOSIS — M940 Chondrocostal junction syndrome [Tietze]: Secondary | ICD-10-CM

## 2015-04-02 DIAGNOSIS — M9908 Segmental and somatic dysfunction of rib cage: Secondary | ICD-10-CM | POA: Diagnosis not present

## 2015-04-02 DIAGNOSIS — M999 Biomechanical lesion, unspecified: Secondary | ICD-10-CM

## 2015-04-02 DIAGNOSIS — M9902 Segmental and somatic dysfunction of thoracic region: Secondary | ICD-10-CM

## 2015-04-02 NOTE — Patient Instructions (Signed)
Verbal instructions given

## 2015-04-02 NOTE — Assessment & Plan Note (Signed)
Decision today to treat with OMT was based on Physical Exam  After verbal consent patient was treated with HVLA, ME, FPR techniques in cervical, thoracic and rib areas  Patient tolerated the procedure well with improvement in symptoms  Patient given exercises, stretches and lifestyle modifications  See medications in patient instructions if given  Patient will follow up in 6-8 weeks             Decision today to treat with OMT was based on Physical Exam  After verbal consent patient was treated with HVLA, ME, FPR techniques in cervical, thoracic and rib areas  Patient tolerated the procedure well with improvement in symptoms  Patient given exercises, stretches and lifestyle modifications  See medications in patient instructions if given  Patient will follow up in 6-8 weeks

## 2015-04-02 NOTE — Progress Notes (Signed)
[name] [address], [place] [address] Phone: [phone] Subjective:     CC: Right chest wall pain follow up  [id number] Christina [name] is a 45 y.o. female coming in with complaint of right chest wall pain. Patient had what seemed to be more of a costochondral injury. Patient was having more of a slipped rib syndrome.   Patient has been doing relatively well. Continues to work regularly. Continues to work out regularly as well. Patient is having some mild increase pain on the right upper back. Seems to be stopping her from activity. Seems little more sore than it has been recently though. Denies any radiation down the arm or any numbness or tingling. Patient has found ways to help her manipulated herself.  Past Medical History  Diagnosis Date  . Hyperthyroidism   . Migraines     with asthma  . Hypercholesteremia    Past Surgical History  Procedure Laterality Date  . Breast biopsy Right 06-26-14    benign per patient   Social History  Substance Use Topics  . Smoking status: Never Smoker   . Smokeless tobacco: Never Used  . Alcohol Use: No   Allergies  Allergen Reactions  . Topamax [Topiramate]     Pain in legs, SOB, and almost passed out   Family History  Problem Relation Age of Onset  . Diabetes Mother   . Hypertension Mother   . Colon cancer Mother   . Breast cancer Mother   . Prostate cancer Father         Past medical history, social, surgical and family history all reviewed in electronic medical record.   Review of Systems: No headache, visual changes, nausea, vomiting, diarrhea, constipation, dizziness, abdominal pain, skin rash, fevers, chills, night sweats, weight loss, swollen lymph nodes, body aches, joint swelling, muscle aches, chest pain, shortness of breath, mood changes.   Objective Blood pressure 116/80, pulse 71, weight 163 lb (73.936 kg), SpO2 99 %.  General: No apparent distress alert and oriented x3  mood and affect normal, dressed appropriately.  HEENT: Pupils equal, extraocular movements intact  Respiratory: Patient's speak in full sentences and does not appear short of breath  Cardiovascular: No lower extremity edema, non tender, no erythema  Skin: Warm dry intact with no signs of infection or rash on extremities or on axial skeleton.  Abdomen: Soft nontender  Neuro: Cranial nerves II through XII are intact, neurovascularly intact in all extremities with 2+ DTRs and 2+ pulses.  Lymph: No lymphadenopathy of posterior or anterior cervical chain or axillae bilaterally.  Gait normal with good balance and coordination.  MSK:  Non tender with full range of motion and good stability and symmetric strength and tone of shoulders, elbows, wrist, hip, knee and ankles bilaterally.  Neck: Inspection unremarkable. No palpable stepoffs. Negative Spurling's maneuver. Full neck range of motion Grip strength and sensation normal in bilateral hands Strength good C4 to T1 distribution No sensory change to C4 to T1 Negative Hoffman sign bilaterally Reflexes normal  Osteopathic findings C2 flexed rotated and side bent right C4 flexed rotated and side bent left C7 flexed rotated and side bent right T3 extended rotated and side bent right with inhaled third rib T6 extended rotated and side bent right L2 flexed rotated and side bent right  Sacrum left on left      Impression and Recommendations:     This case required medical decision making of moderate complexity.

## 2015-04-02 NOTE — Assessment & Plan Note (Signed)
Patient continues to do better overall. I do see that there third rib seems to be more chronic. We discussed icing and home exercises. We discussed which activities to do a which ones to avoid. Patient though has been in better spirits recently. Patient will make any significant changes and see me again in 6 weeks for further evaluation and treatment.

## 2015-05-14 ENCOUNTER — Ambulatory Visit (INDEPENDENT_AMBULATORY_CARE_PROVIDER_SITE_OTHER): Payer: Federal, State, Local not specified - PPO | Admitting: Family Medicine

## 2015-05-14 ENCOUNTER — Encounter: Payer: Self-pay | Admitting: Family Medicine

## 2015-05-14 VITALS — BP 118/80 | HR 75 | Ht 67.0 in | Wt 165.0 lb

## 2015-05-14 DIAGNOSIS — M999 Biomechanical lesion, unspecified: Secondary | ICD-10-CM

## 2015-05-14 DIAGNOSIS — M9908 Segmental and somatic dysfunction of rib cage: Secondary | ICD-10-CM | POA: Diagnosis not present

## 2015-05-14 DIAGNOSIS — M9901 Segmental and somatic dysfunction of cervical region: Secondary | ICD-10-CM

## 2015-05-14 DIAGNOSIS — M9902 Segmental and somatic dysfunction of thoracic region: Secondary | ICD-10-CM

## 2015-05-14 DIAGNOSIS — M940 Chondrocostal junction syndrome [Tietze]: Secondary | ICD-10-CM

## 2015-05-14 NOTE — Assessment & Plan Note (Addendum)
           Decision today to treat with OMT was based on Physical Exam  After verbal consent patient was treated with HVLA, ME, FPR techniques in cervical, thoracic and rib areas  Patient tolerated the procedure well with improvement in symptoms  Patient given exercises, stretches and lifestyle modifications  See medications in patient instructions if given  Patient will follow up in 8-12 weeks

## 2015-05-14 NOTE — Progress Notes (Signed)
  [name] [address] [place], [place] [address] Phone: [phone] Subjective:     CC: neck and chest wall follow-up  [id number] Christina [name] is a 45 y.o. female coming in with complaint of right slipped rib syndrome. Patient states since last visit she has not had any significant pain whatsoever. States that overall she is feeling good. Working on a regular basis. Very happy with the results. Nothing that is stopping her from activity.  Past Medical History  Diagnosis Date  . Hyperthyroidism   . Migraines     with asthma  . Hypercholesteremia    Past Surgical History  Procedure Laterality Date  . Breast biopsy Right 06-26-14    benign per patient   Social History  Substance Use Topics  . Smoking status: Never Smoker   . Smokeless tobacco: Never Used  . Alcohol Use: No   Allergies  Allergen Reactions  . Topamax [Topiramate]     Pain in legs, SOB, and almost passed out   Family History  Problem Relation Age of Onset  . Diabetes Mother   . Hypertension Mother   . Colon cancer Mother   . Breast cancer Mother   . Prostate cancer Father         Past medical history, social, surgical and family history all reviewed in electronic medical record.   Review of Systems: No headache, visual changes, nausea, vomiting, diarrhea, constipation, dizziness, abdominal pain, skin rash, fevers, chills, night sweats, weight loss, swollen lymph nodes, body aches, joint swelling, muscle aches, chest pain, shortness of breath, mood changes.   Objective Blood pressure 118/80, pulse 75, height 5\' 7"  (1.702 m), weight 165 lb (74.844 kg), SpO2 99 %.  General: No apparent distress alert and oriented x3 mood and affect normal, dressed appropriately.  HEENT: Pupils equal, extraocular movements intact  Respiratory: Patient's speak in full sentences and does not appear short of breath  Cardiovascular: No lower extremity edema, non tender, no erythema    Skin: Warm dry intact with no signs of infection or rash on extremities or on axial skeleton.  Abdomen: Soft nontender  Neuro: Cranial nerves II through XII are intact, neurovascularly intact in all extremities with 2+ DTRs and 2+ pulses.  Lymph: No lymphadenopathy of posterior or anterior cervical chain or axillae bilaterally.  Gait normal with good balance and coordination.  MSK:  Non tender with full range of motion and good stability and symmetric strength and tone of shoulders, elbows, wrist, hip, knee and ankles bilaterally.  Neck: Inspection unremarkable. No palpable stepoffs. Negative Spurling's maneuver. Full neck range of motion Grip strength and sensation normal in bilateral hands Strength good C4 to T1 distribution No sensory change to C4 to T1 Negative Hoffman sign bilaterally Reflexes normal  Osteopathic findings C2 flexed rotated and side bent right C4 flexed rotated and side bent left C7 flexed rotated and side bent right T3 extended rotated and side bent right with inhaled third rib L2 flexed rotated and side bent right  Sacrum left on left Improvement from previous exam     Impression and Recommendations:     This case required medical decision making of moderate complexity.

## 2015-05-14 NOTE — Assessment & Plan Note (Signed)
Patient continues to have more of a dull, throbbing aching pain and sometimes but not as much. Patient is doing very well and sustained quite active. Continue the same regimen as well as the vitamins. We discussed icing if necessary. Discussed proper shoewear with the amount walking that she does. At this point we can spacer at the A2 12 week intervals.

## 2015-05-14 NOTE — Patient Instructions (Signed)
Good to see you  Ice is still good You are doing amazing [employer] orthotics "total support" online would be great  Good shoes with rigid bottom.  Christina [name], [place] or New balance greater then 700 Overall you are doing well See me again in 8-1 weeks!

## 2015-05-14 NOTE — Progress Notes (Signed)
Pre visit review using our clinic review tool, if applicable. No additional management support is needed unless otherwise documented below in the visit note. 

## 2015-06-11 ENCOUNTER — Other Ambulatory Visit: Payer: Self-pay

## 2015-06-11 DIAGNOSIS — Z1231 Encounter for screening mammogram for malignant neoplasm of breast: Secondary | ICD-10-CM

## 2015-06-20 ENCOUNTER — Ambulatory Visit
Admission: RE | Admit: 2015-06-20 | Discharge: 2015-06-20 | Disposition: A | Discharge status: Home / Self Care | Payer: Federal, State, Local not specified - PPO | Source: Ambulatory Visit

## 2015-06-20 DIAGNOSIS — Z1231 Encounter for screening mammogram for malignant neoplasm of breast: Secondary | ICD-10-CM

## 2015-07-09 ENCOUNTER — Ambulatory Visit (INDEPENDENT_AMBULATORY_CARE_PROVIDER_SITE_OTHER): Payer: Federal, State, Local not specified - PPO | Admitting: Family Medicine

## 2015-07-09 ENCOUNTER — Ambulatory Visit: Payer: Federal, State, Local not specified - PPO | Admitting: Family

## 2015-07-09 ENCOUNTER — Encounter: Payer: Self-pay | Admitting: Family Medicine

## 2015-07-09 VITALS — BP 108/78 | HR 79 | Ht 67.0 in | Wt 162.0 lb

## 2015-07-09 DIAGNOSIS — M9902 Segmental and somatic dysfunction of thoracic region: Secondary | ICD-10-CM

## 2015-07-09 DIAGNOSIS — M940 Chondrocostal junction syndrome [Tietze]: Secondary | ICD-10-CM

## 2015-07-09 DIAGNOSIS — M9908 Segmental and somatic dysfunction of rib cage: Secondary | ICD-10-CM | POA: Diagnosis not present

## 2015-07-09 DIAGNOSIS — M9901 Segmental and somatic dysfunction of cervical region: Secondary | ICD-10-CM | POA: Diagnosis not present

## 2015-07-09 DIAGNOSIS — M999 Biomechanical lesion, unspecified: Secondary | ICD-10-CM

## 2015-07-09 NOTE — Progress Notes (Signed)
Pre visit review using our clinic review tool, if applicable. No additional management support is needed unless otherwise documented below in the visit note. 

## 2015-07-09 NOTE — Patient Instructions (Signed)
Good to see you  I am glad we got you in Watch the reaching and stay hydrated.  Stay active and continue the exercises See me again in 6 weeks to be safe.

## 2015-07-09 NOTE — Assessment & Plan Note (Signed)
Continues to give her some discomfort. We discussed with patient again about the upper back musculature. Patient will continue to work on posture and we did discuss lifting mechanics. Patient will see me a little more frequently and will see me in 6 weeks for further evaluation and treatment. Did respond to osteopathic manipulation.

## 2015-07-09 NOTE — Assessment & Plan Note (Signed)
  Decision today to treat with OMT was based on Physical Exam  After verbal consent patient was treated with HVLA, ME, FPR techniques in cervical, thoracic and rib areas  Patient tolerated the procedure well with improvement in symptoms  Patient given exercises, stretches and lifestyle modifications  See medications in patient instructions if given  Patient will follow up in 6 weeks

## 2015-07-09 NOTE — Progress Notes (Signed)
  [name] [address], [place] [address] Phone: [phone] Subjective:     CC: neck and chest wall follow-up  [id number] Christina [name] is a 45 y.o. female coming in with complaint of right slipped rib syndrome. Patient is having some mild increase in discomfort. Patient did have did travel out of town this spontaneously. Patient did have some mild increase in stress as well. Patient states that seem to make and will be worse. Patient denies any radiation the arm and his stay localized. More surrounding the shoulder than anything else. Made it difficult to do some activities such as reaching overhead repetitively.  Past Medical History  Diagnosis Date  . Hyperthyroidism   . Migraines     with asthma  . Hypercholesteremia    Past Surgical History  Procedure Laterality Date  . Breast biopsy Right 06-26-14    benign per patient   Social History  Substance Use Topics  . Smoking status: Never Smoker   . Smokeless tobacco: Never Used  . Alcohol Use: No   Allergies  Allergen Reactions  . Topamax [Topiramate]     Pain in legs, SOB, and almost passed out   Family History  Problem Relation Age of Onset  . Diabetes Mother   . Hypertension Mother   . Colon cancer Mother   . Breast cancer Mother   . Prostate cancer Father      Past medical history, social, surgical and family history all reviewed in electronic medical record.   Review of Systems: No headache, visual changes, nausea, vomiting, diarrhea, constipation, dizziness, abdominal pain, skin rash, fevers, chills, night sweats, weight loss, swollen lymph nodes, body aches, joint swelling,chest pain, shortness of breath, mood changes.   Objective Blood pressure 108/78, pulse 79, height 5\' 7"  (1.702 m), weight 162 lb (73.483 kg), SpO2 99 %.  General: No apparent distress alert and oriented x3 mood and affect normal, dressed appropriately.  HEENT: Pupils equal, extraocular movements  intact  Respiratory: Patient's speak in full sentences and does not appear short of breath  Cardiovascular: No lower extremity edema, non tender, no erythema  Skin: Warm dry intact with no signs of infection or rash on extremities or on axial skeleton.  Abdomen: Soft nontender  Neuro: Cranial nerves II through XII are intact, neurovascularly intact in all extremities with 2+ DTRs and 2+ pulses.  Lymph: No lymphadenopathy of posterior or anterior cervical chain or axillae bilaterally.  Gait normal with good balance and coordination.  MSK:  Non tender with full range of motion and good stability and symmetric strength and tone of shoulders, elbows, wrist, hip, knee and ankles bilaterally.  Neck: Inspection unremarkable. No palpable stepoffs. Negative Spurling's maneuver. Mild limitation with right-sided side bending in left-sided rotation Grip strength and sensation normal in bilateral hands Strength good C4 to T1 distribution No sensory change to C4 to T1 Negative Hoffman sign bilaterally Reflexes normal Tenderness of the right trapezius muscle  Osteopathic findings C2 flexed rotated and side bent right C4 flexed rotated and side bent left T3 extended rotated and side bent right with inhaled third rib L2 flexed rotated and side bent right  Sacrum left on left      Impression and Recommendations:     This case required medical decision making of moderate complexity.

## 2015-07-23 ENCOUNTER — Ambulatory Visit (INDEPENDENT_AMBULATORY_CARE_PROVIDER_SITE_OTHER): Payer: Federal, State, Local not specified - PPO | Admitting: Certified Nurse Midwife

## 2015-07-23 ENCOUNTER — Encounter: Payer: Self-pay | Admitting: Certified Nurse Midwife

## 2015-07-23 VITALS — BP 100/70 | HR 68 | Resp 16 | Ht 67.0 in | Wt 160.0 lb

## 2015-07-23 DIAGNOSIS — R319 Hematuria, unspecified: Secondary | ICD-10-CM | POA: Diagnosis not present

## 2015-07-23 DIAGNOSIS — Z0000 Encounter for general adult medical examination without abnormal findings: Secondary | ICD-10-CM

## 2015-07-23 DIAGNOSIS — Z01419 Encounter for gynecological examination (general) (routine) without abnormal findings: Secondary | ICD-10-CM

## 2015-07-23 LAB — POCT URINALYSIS DIPSTICK
Bilirubin, UA: NEGATIVE
Glucose, UA: NEGATIVE
Ketones, UA: NEGATIVE
Leukocytes, UA: NEGATIVE
Nitrite, UA: NEGATIVE
Protein, UA: NEGATIVE
Urobilinogen, UA: NEGATIVE
pH, UA: 5

## 2015-07-23 LAB — HEMOGLOBIN, FINGERSTICK: Hemoglobin, fingerstick: 14.1 g/dL (ref 12.0–16.0)

## 2015-07-23 NOTE — Patient Instructions (Signed)

## 2015-07-23 NOTE — Progress Notes (Signed)
Reviewed personally.  M. Suzanne Jourdan Maldonado, MD.  

## 2015-07-23 NOTE — Progress Notes (Signed)
45 y.o. [id number] Married  African American Fe here for annual exam. Periods normal, with heavy period x 2 only in the past year with only 3 day duration. Contraception condoms working well. Still battling with right rib issues with slipping our alignment. Sees Orthopedic for follow up. Sees PCP for aex and labs. Denies any urinary frequency , urgency or pain. Takes daily aspirin per PCP. No health issues today.  Patient's last menstrual period was 06/28/2015.          Sexually active: Yes.    The current method of family planning is condoms all the time.    Exercising: No.  exercise Smoker:  no  Health Maintenance: Pap:  07-10-14 neg HPV HR neg MMG: 06-20-15 category d density,birads 1:neg Colonoscopy:  2014 f/u 36yrs BMD:   none TDaP: 05-27-14 Shingles: no Pneumonia: no Hep C and HIV: not done Labs: poct urine-rbc tr, hgb-14.1 Self breast exam: done monthly   reports that she has never smoked. She has never used smokeless tobacco. She reports that she does not drink alcohol or use illicit drugs.  Past Medical History  Diagnosis Date  . Hyperthyroidism   . Migraines     with asthma  . Hypercholesteremia   . Slipping rib syndrome     Past Surgical History  Procedure Laterality Date  . Breast biopsy Right 06-26-14    benign per patient    Current Outpatient Prescriptions  Medication Sig Dispense Refill  . aspirin 81 MG tablet Take 81 mg by mouth daily.    Garlan Fillers Peroxide (EAR DROPS OT) Place in ear(s) as needed.     . cholecalciferol (VITAMIN D) 1000 units tablet Take 1,000 Units by mouth daily.    . Ibuprofen-Famotidine (DUEXIS) 800-26.6 MG TABS Take one pill three times daily 270 tablet 3  . Iron TABS Take by mouth.    . methimazole (TAPAZOLE) 5 MG tablet On M W F    . Pyridoxine HCl (B-6) 100 MG TABS Take by mouth.    . vitamin B-12 (CYANOCOBALAMIN) 1000 MCG tablet Take 1,000 mcg by mouth daily.     No current facility-administered medications for this visit.     Family History  Problem Relation Age of Onset  . Diabetes Mother   . Hypertension Mother   . Colon cancer Mother   . Breast cancer Mother   . Prostate cancer Father     ROS:  Pertinent items are noted in HPI.  Otherwise, a comprehensive ROS was negative.  Exam:   BP 100/70 mmHg  Pulse 68  Resp 16  Ht 5\' 7"  (1.702 m)  Wt 160 lb (72.576 kg)  BMI 25.05 kg/m2  LMP 06/28/2015 Height: 5\' 7"  (170.2 cm) Ht Readings from Last 3 Encounters:  07/23/15 5\' 7"  (1.702 m)  07/09/15 5\' 7"  (1.702 m)  05/14/15 5\' 7"  (1.702 m)    General appearance: alert, cooperative and appears stated age Head: Normocephalic, without obvious abnormality, atraumatic Neck: no adenopathy, supple, symmetrical, trachea midline and thyroid normal to inspection and palpation Lungs: clear to auscultation bilaterally Breasts: normal appearance, no masses or tenderness, No nipple retraction or dimpling, No nipple discharge or bleeding, No axillary or supraclavicular adenopathy Heart: regular rate and rhythm Abdomen: soft, non-tender; no masses,  no organomegaly Extremities: extremities normal, atraumatic, no cyanosis or edema Skin: Skin color, texture, turgor normal. No rashes or lesions Lymph nodes: Cervical, supraclavicular, and axillary nodes normal. No abnormal inguinal nodes palpated Neurologic: Grossly normal   Pelvic: External genitalia:  no lesions              Urethra:  normal appearing urethra with no masses, tenderness or lesions              Bartholin's and Skene's: normal                 Vagina: normal appearing vagina with normal color and discharge, no lesions              Cervix: multiparous appearance, no cervical motion tenderness and no lesions              Pap taken: No. 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  Warning signs of UTI given and need to advise if occurs.  Lab Urine micro  Pap smear as above not taken   counseled on breast self exam, mammography screening, adequate intake of calcium and vitamin D, diet and exercise  return annually or prn  An After Visit Summary was printed and given to the patient.

## 2015-07-24 LAB — URINALYSIS, MICROSCOPIC ONLY
BACTERIA UA: NONE SEEN [HPF]
CASTS: NONE SEEN [LPF]
CRYSTALS: NONE SEEN [HPF]
WBC UA: NONE SEEN WBC/HPF (ref <=5)
Yeast: NONE SEEN [HPF]

## 2015-08-19 NOTE — Assessment & Plan Note (Addendum)
Patient is in relatively better than last exam. We discussed with patient again to continue with the conservative therapy. With her doing better posterior every 7-8 weeks.

## 2015-08-19 NOTE — Assessment & Plan Note (Signed)
  Decision today to treat with OMT was based on Physical Exam  After verbal consent patient was treated with HVLA, ME, FPR techniques in cervical, thoracic and rib areas  Patient tolerated the procedure well with improvement in symptoms  Patient given exercises, stretches and lifestyle modifications  See medications in patient instructions if given  Patient will follow up in 8-10 weeks

## 2015-08-19 NOTE — Progress Notes (Signed)
  [name] [place], [place] [address] Phone: [phone] Subjective:     CC: neck and chest wall follow-up  [id number]  Christina [name] is a 45 y.o. female coming in with complaint of right slipped rib syndrome.  Patient has been doing fairly well with conservative therapy. Mild impingement of the shoulder. Patient overall has been doing relatively well. No significant increasing pain. Some mild spasming in the legs but nothing severe. No significant radiation down the arms at all. Still having some tightness of the right upper back.  Past Medical History:  Diagnosis Date  . Hypercholesteremia   . Hyperthyroidism   . Migraines    with asthma  . Slipping rib syndrome    Past Surgical History:  Procedure Laterality Date  . BREAST BIOPSY Right 06-26-14   benign per patient   Social History  Substance Use Topics  . Smoking status: Never Smoker  . Smokeless tobacco: Never Used  . Alcohol use No   Allergies  Allergen Reactions  . Topamax [Topiramate]     Pain in legs, SOB, and almost passed out   Family History  Problem Relation Age of Onset  . Diabetes Mother   . Hypertension Mother   . Colon cancer Mother   . Breast cancer Mother   . Prostate cancer Father      Past medical history, social, surgical and family history all reviewed in electronic medical record.   Review of Systems: No headache, visual changes, nausea, vomiting, diarrhea, constipation, dizziness, abdominal pain, skin rash, fevers, chills, night sweats, weight loss, swollen lymph nodes, body aches, joint swelling,chest pain, shortness of breath, mood changes.   Objective  Blood pressure 112/72, pulse 71, height 5\' 7"  (1.702 m), weight 163 lb (73.9 kg), last menstrual period 06/28/2015, SpO2 98 %.  General: No apparent distress alert and oriented x3 mood and affect normal, dressed appropriately.  HEENT: Pupils equal, extraocular movements intact  Respiratory:  Patient's speak in full sentences and does not appear short of breath  Cardiovascular: No lower extremity edema, non tender, no erythema  Skin: Warm dry intact with no signs of infection or rash on extremities or on axial skeleton.  Abdomen: Soft nontender  Neuro: Cranial nerves II through XII are intact, neurovascularly intact in all extremities with 2+ DTRs and 2+ pulses.  Lymph: No lymphadenopathy of posterior or anterior cervical chain or axillae bilaterally.  Gait normal with good balance and coordination.  MSK:  Non tender with full range of motion and good stability and symmetric strength and tone of shoulders, elbows, wrist, hip, knee and ankles bilaterally.  Neck: Inspection unremarkable. No palpable stepoffs. Negative Spurling's maneuver. Mild increase in range of motion from previous exam Grip strength and sensation normal in bilateral hands Strength good C4 to T1 distribution No sensory change to C4 to T1 Negative Hoffman sign bilaterally Reflexes normal Tenderness of the right trapezius muscle still present  Osteopathic findings C2 flexed rotated and side bent right C4 flexed rotated and side bent left T3 extended rotated and side bent right with inhaled third rib L2 flexed rotated and side bent right  Sacrum left on left      Impression and Recommendations:     This case required medical decision making of moderate complexity.

## 2015-08-20 ENCOUNTER — Encounter: Payer: Self-pay | Admitting: Family Medicine

## 2015-08-20 ENCOUNTER — Ambulatory Visit (INDEPENDENT_AMBULATORY_CARE_PROVIDER_SITE_OTHER): Payer: Federal, State, Local not specified - PPO | Admitting: Family Medicine

## 2015-08-20 VITALS — BP 112/72 | HR 71 | Ht 67.0 in | Wt 163.0 lb

## 2015-08-20 DIAGNOSIS — M9901 Segmental and somatic dysfunction of cervical region: Secondary | ICD-10-CM

## 2015-08-20 DIAGNOSIS — M940 Chondrocostal junction syndrome [Tietze]: Secondary | ICD-10-CM

## 2015-08-20 DIAGNOSIS — M9902 Segmental and somatic dysfunction of thoracic region: Secondary | ICD-10-CM

## 2015-08-20 DIAGNOSIS — M999 Biomechanical lesion, unspecified: Secondary | ICD-10-CM

## 2015-08-20 DIAGNOSIS — M9908 Segmental and somatic dysfunction of rib cage: Secondary | ICD-10-CM | POA: Diagnosis not present

## 2015-08-20 NOTE — Progress Notes (Signed)
Pre visit review using our clinic review tool, if applicable. No additional management support is needed unless otherwise documented below in the visit note. 

## 2015-08-20 NOTE — Patient Instructions (Signed)
Good to see you  Keep doing what you do [employer] "total support" online would be great  Try shoe market for the shoes ( then buy online) See me again in 7-8 weeks.

## 2015-09-10 ENCOUNTER — Encounter: Payer: Self-pay | Admitting: Endocrinology

## 2015-09-10 ENCOUNTER — Ambulatory Visit (INDEPENDENT_AMBULATORY_CARE_PROVIDER_SITE_OTHER): Payer: Federal, State, Local not specified - PPO | Admitting: Endocrinology

## 2015-09-10 VITALS — BP 112/70 | HR 72 | Temp 98.8°F | Ht 67.0 in | Wt 163.0 lb

## 2015-09-10 DIAGNOSIS — E0590 Thyrotoxicosis, unspecified without thyrotoxic crisis or storm: Secondary | ICD-10-CM

## 2015-09-10 LAB — TSH: TSH: 1.8 u[IU]/mL (ref 0.35–4.50)

## 2015-09-10 NOTE — Progress Notes (Signed)
   Subjective:    Patient ID: Christina [name]. [name], female    DOB: 1970/11/28, 45 y.o.   MRN: [id number]  HPI Pt returns for f/u of hyperthyroidism (dx'ed in early 2015, in [place]; nuc med scan showed diffuse uptake (45% at 24 hrs); US showed diffuse goiter, with multiple very small nodules; pt is unaware why tapazole was chosen as rx, but she wishes to continue). she takes tapazole as rx'ed.  pt states she feels well in general.   Past Medical History:  Diagnosis Date  . Hypercholesteremia   . Hyperthyroidism   . Migraines    with asthma  . Slipping rib syndrome     Past Surgical History:  Procedure Laterality Date  . BREAST BIOPSY Right 06-26-14   benign per patient    Social History   Social History  . Marital status: Married    Spouse name: N/A  . Number of children: 1  . Years of education: 55   Occupational History  . Mail Processor    Social History Main Topics  . Smoking status: Never Smoker  . Smokeless tobacco: Never Used  . Alcohol use No  . Drug use: No  . Sexual activity: Yes    Partners: Male    Birth control/ protection: Condom   Other Topics Concern  . Not on file   Social History Narrative   Born and raised in [place]. Currently resides in a house with her child. No pets. Fun: Go to the movies.    Denies religious beliefs effecting health care.     Current Outpatient Prescriptions on File Prior to Visit  Medication Sig Dispense Refill  . aspirin 81 MG tablet Take 81 mg by mouth daily.    Garlan Fillers Peroxide (EAR DROPS OT) Place in ear(s) as needed.     . cholecalciferol (VITAMIN D) 1000 units tablet Take 1,000 Units by mouth daily.    . Ibuprofen-Famotidine (DUEXIS) 800-26.6 MG TABS Take one pill three times daily 270 tablet 3  . Iron TABS Take by mouth.    . methimazole (TAPAZOLE) 5 MG tablet On M W F    . Pyridoxine HCl (B-6) 100 MG TABS Take by mouth.    . vitamin B-12 (CYANOCOBALAMIN) 1000 MCG tablet Take 1,000 mcg by mouth daily.     No  current facility-administered medications on file prior to visit.     Allergies  Allergen Reactions  . Topamax [Topiramate]     Pain in legs, SOB, and almost passed out    Family History  Problem Relation Age of Onset  . Diabetes Mother   . Hypertension Mother   . Colon cancer Mother   . Breast cancer Mother   . Prostate cancer Father     BP 112/70 (BP Location: Left Arm, Patient Position: Sitting, Cuff Size: Normal)   Pulse 72   Temp 98.8 F (37.1 C) (Oral)   Ht 5\' 7"  (1.702 m)   Wt 163 lb (73.9 kg)   LMP 08/26/2015   SpO2 98%   BMI 25.53 kg/m   Review of Systems Denies fever    Objective:   Physical Exam VITAL SIGNS:  See vs page.   GENERAL: no distress.  NECK: thyroid is slightly and diffusely enlarged.    Lab Results  Component Value Date   TSH 1.80 09/10/2015      Assessment & Plan:  Hyperthyroidism: well-controlled.  Please continue the same medication.

## 2015-09-10 NOTE — Patient Instructions (Addendum)
A thyroid blood test is requested for you today.  We'll let you know about the results.   if ever you have fever while taking methimazole, stop it and call us, because of the risk of a rare side-effect.   If you want, you can change your mind and do the radioactive iodine treatment in the future.   Please come back for a follow-up appointment in 6 months.

## 2015-10-06 ENCOUNTER — Ambulatory Visit (INDEPENDENT_AMBULATORY_CARE_PROVIDER_SITE_OTHER): Payer: Federal, State, Local not specified - PPO | Admitting: Nurse Practitioner

## 2015-10-06 ENCOUNTER — Encounter: Payer: Self-pay | Admitting: Nurse Practitioner

## 2015-10-06 VITALS — BP 102/78 | HR 75 | Temp 98.2°F | Wt 161.0 lb

## 2015-10-06 DIAGNOSIS — H8110 Benign paroxysmal vertigo, unspecified ear: Secondary | ICD-10-CM | POA: Diagnosis not present

## 2015-10-06 MED ORDER — MECLIZINE HCL 12.5 MG PO TABS: 12.5000 mg | ORAL_TABLET | Freq: Two times a day (BID) | ORAL | 0 refills | Status: DC | PRN

## 2015-10-06 MED ORDER — ONDANSETRON HCL 4 MG PO TABS: 4.0000 mg | ORAL_TABLET | Freq: Three times a day (TID) | ORAL | 0 refills | Status: DC | PRN

## 2015-10-06 NOTE — Progress Notes (Signed)
Subjective:  Patient ID: Christina [name]. Christina [name], female    DOB: [birth year]  Age: 45 y.o. MRN: [id number]  CC: Dizziness (dizziness, just started this morning----vertigo was experienced several years ago---headache on right side started yesterday---took ibuprofen and eventually helped, but took awhile, ears felt really itchy and she used drops on her med list)   Christina [name] presents for dizziness  Dizziness  This is a recurrent problem. The current episode started today. The problem occurs intermittently. The problem has been unchanged. Associated symptoms include nausea and vertigo. Pertinent negatives include no abdominal pain, anorexia, chest pain, chills, congestion, diaphoresis, fever, headaches, neck pain, numbness, sore throat, swollen glands, urinary symptoms, visual change, vomiting or weakness. Exacerbated by: Head movement. She has tried nothing for the symptoms.  Had similar symptoms several years ago, treated with meclizine and physical therapy. Denies any palpitations, no chest pain or shortness of breath.  Outpatient Medications Prior to Visit  Medication Sig Dispense Refill  . aspirin 81 MG tablet Take 81 mg by mouth daily.    Garlan Fillers Peroxide (EAR DROPS OT) Place in ear(s) as needed.     . cholecalciferol (VITAMIN D) 1000 units tablet Take 1,000 Units by mouth daily.    . Ibuprofen-Famotidine (DUEXIS) 800-26.6 MG TABS Take one pill three times daily 270 tablet 3  . Iron TABS Take by mouth.    . methimazole (TAPAZOLE) 5 MG tablet On M W F    . Pyridoxine HCl (B-6) 100 MG TABS Take by mouth.    . vitamin B-12 (CYANOCOBALAMIN) 1000 MCG tablet Take 1,000 mcg by mouth daily.     No facility-administered medications prior to visit.     ROS See HPI  Objective:  BP 102/78   Pulse 75   Temp 98.2 F (36.8 C)   Wt 161 lb (73 kg)   LMP 08/26/2015   SpO2 98%   BMI 25.22 kg/m   BP Readings from Last 3 Encounters:  10/06/15 102/78  09/10/15 112/70  08/20/15 112/72     Wt Readings from Last 3 Encounters:  10/06/15 161 lb (73 kg)  09/10/15 163 lb (73.9 kg)  08/20/15 163 lb (73.9 kg)    Physical Exam  Constitutional: She is oriented to person, place, and time.  HENT:  Right Ear: External ear normal.  Nose: Nose normal.  Mouth/Throat: Oropharyngeal exudate present.  Eyes: Conjunctivae and EOM are normal. Pupils are equal, round, and reactive to light. No scleral icterus.  Neck: Normal range of motion. Neck supple. No thyromegaly present.  Cardiovascular: Normal rate and regular rhythm.  Exam reveals gallop.   S4 gallop  Pulmonary/Chest: Effort normal and breath sounds normal. No stridor.  Musculoskeletal: She exhibits no edema.  Lymphadenopathy:    She has no cervical adenopathy.  Neurological: She is alert and oriented to person, place, and time. Coordination normal.  Skin: Skin is warm and dry.  Psychiatric: She has a normal mood and affect. Her behavior is normal.    Lab Results  Component Value Date   WBC 4.4 05/18/2014   HGB 14.1 07/23/2015   HCT 39.9 05/18/2014   PLT 290 05/18/2014   GLUCOSE 89 05/27/2014   CHOL 111 05/27/2014   TRIG 41.0 05/27/2014   HDL 27.70 (L) 05/27/2014   LDLCALC 75 05/27/2014   NA 138 05/27/2014   K 4.0 05/27/2014   CL 105 05/27/2014   CREATININE 0.99 05/27/2014   BUN 11 05/27/2014   CO2 27 05/27/2014   TSH 1.80 09/10/2015  HGBA1C 5.4 05/27/2014    Assessment & Plan:   Tanyjah was seen today for dizziness.  Diagnoses and all orders for this visit:  BPPV (benign paroxysmal positional vertigo), unspecified laterality -     meclizine (ANTIVERT) 12.5 MG tablet; Take 1 tablet (12.5 mg total) by mouth 2 (two) times daily as needed for dizziness (do not use for more than 3days). -     ondansetron (ZOFRAN) 4 MG tablet; Take 1 tablet (4 mg total) by mouth every 8 (eight) hours as needed for nausea or vomiting.   I am having Ms. Smith start on meclizine and ondansetron. I am also having her maintain her  Carbamide Peroxide (EAR DROPS OT), aspirin, Ibuprofen-Famotidine, vitamin B-12, Iron, B-6, methimazole, cholecalciferol, and vitamin C.  Meds ordered this encounter  Medications  . vitamin C (ASCORBIC ACID) 500 MG tablet    Sig: Take 500 mg by mouth daily.  . meclizine (ANTIVERT) 12.5 MG tablet    Sig: Take 1 tablet (12.5 mg total) by mouth 2 (two) times daily as needed for dizziness (do not use for more than 3days).    Dispense:  6 tablet    Refill:  0    Order Specific Question:   Supervising Provider    Answer:   Cassandria Anger [[id number]]  . ondansetron (ZOFRAN) 4 MG tablet    Sig: Take 1 tablet (4 mg total) by mouth every 8 (eight) hours as needed for nausea or vomiting.    Dispense:  20 tablet    Refill:  0    Order Specific Question:   Supervising Provider    Answer:   Cassandria Anger [[id number]]    Follow-up: Return if symptoms worsen or fail to improve.  Wilfred Lacy, NP

## 2015-10-06 NOTE — Patient Instructions (Signed)
Benign Positional Vertigo Vertigo is the feeling that you or your surroundings are moving when they are not. Benign positional vertigo is the most common form of vertigo. The cause of this condition is not serious (is benign). This condition is triggered by certain movements and positions (is positional). This condition can be dangerous if it occurs while you are doing something that could endanger you or others, such as driving.  CAUSES In many cases, the cause of this condition is not known. It may be caused by a disturbance in an area of the inner ear that helps your brain to sense movement and balance. This disturbance can be caused by a viral infection (labyrinthitis), head injury, or repetitive motion. RISK FACTORS This condition is more likely to develop in:  Women.  People who are 46 years of age or older. SYMPTOMS Symptoms of this condition usually happen when you move your head or your eyes in different directions. Symptoms may start suddenly, and they usually last for less than a minute. Symptoms may include:  Loss of balance and falling.  Feeling like you are spinning or moving.  Feeling like your surroundings are spinning or moving.  Nausea and vomiting.  Blurred vision.  Dizziness.  Involuntary eye movement (nystagmus). Symptoms can be mild and cause only slight annoyance, or they can be severe and interfere with daily life. Episodes of benign positional vertigo may return (recur) over time, and they may be triggered by certain movements. Symptoms may improve over time. DIAGNOSIS This condition is usually diagnosed by medical history and a physical exam of the head, neck, and ears. You may be referred to a health care provider who specializes in ear, nose, and throat (ENT) problems (otolaryngologist) or a provider who specializes in disorders of the nervous system (neurologist). You may have additional testing, including:  MRI.  A CT scan.  Eye movement tests. Your  health care provider may ask you to change positions quickly while he or she watches you for symptoms of benign positional vertigo, such as nystagmus. Eye movement may be tested with an electronystagmogram (ENG), caloric stimulation, the Dix-Hallpike test, or the roll test.  An electroencephalogram (EEG). This records electrical activity in your brain.  Hearing tests. TREATMENT Usually, your health care provider will treat this by moving your head in specific positions to adjust your inner ear back to normal. Surgery may be needed in severe cases, but this is rare. In some cases, benign positional vertigo may resolve on its own in 2-4 weeks. HOME CARE INSTRUCTIONS Safety  Move slowly.Avoid sudden body or head movements.  Avoid driving.  Avoid operating heavy machinery.  Avoid doing any tasks that would be dangerous to you or others if a vertigo episode would occur.  If you have trouble walking or keeping your balance, try using a cane for stability. If you feel dizzy or unstable, sit down right away.  Return to your normal activities as told by your health care provider. Ask your health care provider what activities are safe for you. General Instructions  Take over-the-counter and prescription medicines only as told by your health care provider.  Avoid certain positions or movements as told by your health care provider.  Drink enough fluid to keep your urine clear or pale yellow.  Keep all follow-up visits as told by your health care provider. This is important. SEEK MEDICAL CARE IF:  You have a fever.  Your condition gets worse or you develop new symptoms.  Your family or friends   notice any behavioral changes.  Your nausea or vomiting gets worse.  You have numbness or a "pins and needles" sensation. SEEK IMMEDIATE MEDICAL CARE IF:  You have difficulty speaking or moving.  You are always dizzy.  You faint.  You develop severe headaches.  You have weakness in your  legs or arms.  You have changes in your hearing or vision.  You develop a stiff neck.  You develop sensitivity to light.   This information is not intended to replace advice given to you by your health care provider. Make sure you discuss any questions you have with your health care provider.   Document Released: 10/19/2005 Document Revised: 10/02/2014 Document Reviewed: 05/06/2014 [employer] Interactive Patient Education 2016 [employer]  

## 2015-10-06 NOTE — Progress Notes (Signed)
Pre visit review using our clinic review tool, if applicable. No additional management support is needed unless otherwise documented below in the visit note. 

## 2015-10-07 NOTE — Progress Notes (Signed)
  [name] [place], [place] [address] Phone: [phone] Subjective:     CC: neck and chest wall follow-up  [id number]  Christina [name] is a 45 y.o. female coming in with complaint of right slipped rib syndrome.  Overall has been doing significantly better. Recently did have some vertigo. Patient though states that that does not seem to be related to her neck and upper back pain. States that she is only needed anti-inflammatory one time since last visit. Overall very happy with the results. Continues to be active.  Past Medical History:  Diagnosis Date  . Hypercholesteremia   . Hyperthyroidism   . Migraines    with asthma  . Slipping rib syndrome    Past Surgical History:  Procedure Laterality Date  . BREAST BIOPSY Right 06-26-14   benign per patient   Social History  Substance Use Topics  . Smoking status: Never Smoker  . Smokeless tobacco: Never Used  . Alcohol use No   Allergies  Allergen Reactions  . Topamax [Topiramate]     Pain in legs, SOB, and almost passed out   Family History  Problem Relation Age of Onset  . Diabetes Mother   . Hypertension Mother   . Colon cancer Mother   . Breast cancer Mother   . Prostate cancer Father      Past medical history, social, surgical and family history all reviewed in electronic medical record.   Review of Systems: No headache, visual changes, nausea, vomiting, diarrhea, constipation, dizziness, abdominal pain, skin rash, fevers, chills, night sweats, weight loss, swollen lymph nodes, body aches, joint swelling,chest pain, shortness of breath, mood changes.   Objective  Blood pressure 116/82, pulse 82, weight 164 lb (74.4 kg), last menstrual period 08/26/2015, SpO2 99 %.  General: No apparent distress alert and oriented x3 mood and affect normal, dressed appropriately.  HEENT: Pupils equal, extraocular movements intact  Respiratory: Patient's speak in full sentences and does  not appear short of breath  Cardiovascular: No lower extremity edema, non tender, no erythema  Skin: Warm dry intact with no signs of infection or rash on extremities or on axial skeleton.  Abdomen: Soft nontender  Neuro: Cranial nerves II through XII are intact, neurovascularly intact in all extremities with 2+ DTRs and 2+ pulses.  Lymph: No lymphadenopathy of posterior or anterior cervical chain or axillae bilaterally.  Gait normal with good balance and coordination.  MSK:  Non tender with full range of motion and good stability and symmetric strength and tone of shoulders, elbows, wrist, hip, knee and ankles bilaterally.  Neck: Inspection unremarkable. No palpable stepoffs. Negative Spurling's maneuver. Continued mild tightness of the right-sided rotation. Grip strength and sensation normal in bilateral hands Strength good C4 to T1 distribution No sensory change to C4 to T1 Negative Hoffman sign bilaterally Reflexes normal Tenderness of the right trapezius muscle still present  Osteopathic findings C2 flexed rotated and side bent right C6 flexed rotated and side bent left T3 extended rotated and side bent right with inhaled third rib L2 flexed rotated and side bent right  Sacrum left on left Minimal change from previous exam.     Impression and Recommendations:     This case required medical decision making of moderate complexity.

## 2015-10-08 ENCOUNTER — Encounter: Payer: Self-pay | Admitting: Family Medicine

## 2015-10-08 ENCOUNTER — Ambulatory Visit (INDEPENDENT_AMBULATORY_CARE_PROVIDER_SITE_OTHER): Payer: Federal, State, Local not specified - PPO | Admitting: Family Medicine

## 2015-10-08 VITALS — BP 116/82 | HR 82 | Wt 164.0 lb

## 2015-10-08 DIAGNOSIS — M9902 Segmental and somatic dysfunction of thoracic region: Secondary | ICD-10-CM | POA: Diagnosis not present

## 2015-10-08 DIAGNOSIS — M9908 Segmental and somatic dysfunction of rib cage: Secondary | ICD-10-CM

## 2015-10-08 DIAGNOSIS — M940 Chondrocostal junction syndrome [Tietze]: Secondary | ICD-10-CM | POA: Diagnosis not present

## 2015-10-08 DIAGNOSIS — M999 Biomechanical lesion, unspecified: Secondary | ICD-10-CM

## 2015-10-08 DIAGNOSIS — M9901 Segmental and somatic dysfunction of cervical region: Secondary | ICD-10-CM

## 2015-10-08 NOTE — Assessment & Plan Note (Signed)
  Decision today to treat with OMT was based on Physical Exam  After verbal consent patient was treated with HVLA, ME, FPR techniques in cervical, thoracic and rib areas  Patient tolerated the procedure well with improvement in symptoms  Patient given exercises, stretches and lifestyle modifications  See medications in patient instructions if given  Patient will follow up in 7-8 weeks

## 2015-10-08 NOTE — Patient Instructions (Signed)
7-8 weeks.

## 2015-10-08 NOTE — Assessment & Plan Note (Signed)
Overall seems to be doing well. 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 We discussed lifting mechanics. Patient will see me again in 7-8 weeks for further evaluation and treatment

## 2015-10-13 ENCOUNTER — Telehealth: Payer: Self-pay | Admitting: Family

## 2015-10-13 NOTE — Telephone Encounter (Signed)
Patient brought in FMLA forms for 10/06/2015 visit with [employer]. Sending to dahlia to complete.   Please fax to # on first page and notify patient when completed.

## 2015-10-13 NOTE — Telephone Encounter (Signed)
Pt is requesting FMLA related to [place] on 09/11. Please advise if this is appropriate and the the amount of day off necessary for condition. Thanks!

## 2015-10-14 NOTE — Telephone Encounter (Signed)
Yes I will sign for 10/06/15 only. I will not be completing paper work for intermittent FMLA. She will need to talk to her pcp about that. Thank you

## 2015-10-14 NOTE — Telephone Encounter (Signed)
FMLA completed for 10/06/2015 and placed on NP desk for signature

## 2015-10-14 NOTE — Telephone Encounter (Signed)
Paperwork signed, faxed, copy sent to scan, original placed in cabinet for pt pick up. Pt advised via personal VM

## 2015-10-27 ENCOUNTER — Telehealth: Payer: Self-pay | Admitting: Endocrinology

## 2015-10-27 MED ORDER — METHIMAZOLE 5 MG PO TABS: ORAL_TABLET | ORAL | 2 refills | Status: DC

## 2015-10-27 NOTE — Telephone Encounter (Signed)
Refill submitted. 

## 2015-10-27 NOTE — Telephone Encounter (Signed)
Pt needs refills on methimazole called to [facility] on [address] please

## 2015-12-02 NOTE — Progress Notes (Signed)
[name] [place] [address] [place], [place] [address] Phone: [phone] Subjective:     CC: neck and chest wall follow-up  [id number]  Revia D. [name] is a 45 y.o. female coming in with complaint of right slipped rib syndrome.  Had been doing very well. Patient was having some mild increase in muscle trapezius muscle spasm as well as somewhat of a tightness of the neck. Patient states She had been doing very well but unfortunately started having worsening pain again in the upper right side of the neck. Seems to be the last 2-3 weeks. We discussed icing regimen which patient has not been doing and does not take any medications for it. Patient is not even taking any of the anti-inflammatories. Patient states though that the pain is starting to affect some of her daily activities and makes it very difficult to follow sleep at night. No new symptoms since symptoms is worsening.  Past Medical History:  Diagnosis Date  . Hypercholesteremia   . Hyperthyroidism   . Migraines    with asthma  . Slipping rib syndrome    Past Surgical History:  Procedure Laterality Date  . BREAST BIOPSY Right 06-26-14   benign per patient   Social History  Substance Use Topics  . Smoking status: Never Smoker  . Smokeless tobacco: Never Used  . Alcohol use No   Allergies  Allergen Reactions  . Topamax [Topiramate]     Pain in legs, SOB, and almost passed out   Family History  Problem Relation Age of Onset  . Diabetes Mother   . Hypertension Mother   . Colon cancer Mother   . Breast cancer Mother   . Prostate cancer Father      Past medical history, social, surgical and family history all reviewed in electronic medical record.   Review of Systems: No headache, visual changes, nausea, vomiting, diarrhea, constipation, dizziness, abdominal pain, skin rash, fevers, chills, night sweats, weight loss, swollen lymph nodes, body aches, joint swelling,chest pain, shortness of  breath, mood changes.   Objective  Blood pressure 122/74, pulse 76, height 5\' 7"  (1.702 m), weight 165 lb (74.8 kg), SpO2 97 %.  Systems examined below as of 12/03/15 General: NAD A&O x3 mood, affect normal  HEENT: Pupils equal, extraocular movements intact no nystagmus Respiratory: not short of breath at rest or with speaking Cardiovascular: No lower extremity edema, non tender Skin: Warm dry intact with no signs of infection or rash on extremities or on axial skeleton. Abdomen: Soft nontender, no masses Neuro: Cranial nerves  intact, neurovascularly intact in all extremities with 2+ DTRs and 2+ pulses. Lymph: No lymphadenopathy appreciated today  Gait normal with good balance and coordination.  MSK: Non tender with full range of motion and good stability and symmetric strength and tone of shoulders, elbows, wrist,  knee hips and ankles bilaterally.  .  Neck: Inspection unremarkable. No palpable stepoffs. Negative Spurling's maneuver. Continued mild tightness of the right-sided rotation. Grip strength and sensation normal in bilateral hands Strength good C4 to T1 distribution No sensory change to C4 to T1 Negative Hoffman sign bilaterally Reflexes normal Increase trapezius muscle spasm from previous exam on the right side. Her points noted.   Osteopathic findings C2 flexed rotated and side bent right C6 flexed rotated and side bent left T3 extended rotated and side bent right with inhaled third rib significant muscle spasm in the area. L2 flexed rotated and side bent right  Sacrum left on left  Minimal change from previous exam.     Impression and Recommendations:     This case required medical decision making of moderate complexity.

## 2015-12-03 ENCOUNTER — Encounter: Payer: Self-pay | Admitting: Family Medicine

## 2015-12-03 ENCOUNTER — Ambulatory Visit (INDEPENDENT_AMBULATORY_CARE_PROVIDER_SITE_OTHER): Payer: Federal, State, Local not specified - PPO | Admitting: Family Medicine

## 2015-12-03 VITALS — BP 122/74 | HR 76 | Ht 67.0 in | Wt 165.0 lb

## 2015-12-03 DIAGNOSIS — M940 Chondrocostal junction syndrome [Tietze]: Secondary | ICD-10-CM | POA: Diagnosis not present

## 2015-12-03 DIAGNOSIS — M999 Biomechanical lesion, unspecified: Secondary | ICD-10-CM

## 2015-12-03 MED ORDER — TIZANIDINE HCL 4 MG PO TABS: 4.0000 mg | ORAL_TABLET | Freq: Four times a day (QID) | ORAL | 0 refills | Status: DC | PRN

## 2015-12-03 MED ORDER — GABAPENTIN 100 MG PO CAPS: 200.0000 mg | ORAL_CAPSULE | Freq: Every day | ORAL | 3 refills | Status: DC

## 2015-12-03 NOTE — Patient Instructions (Addendum)
Good to see you  I am so glad you are doing well.  Zanaflex up to 3 times a day but try it at home first  Gabapentin 200mg  at night can help.  Happy holidays!  Eat way too much [place]  See me again in 6-8 weeks!!!

## 2015-12-03 NOTE — Assessment & Plan Note (Addendum)
>>  ASSESSMENT AND PLAN FOR SLIPPED RIB SYNDROME WRITTEN ON 12/03/2015  1:13 PM BY Antoine Primas M, DO  Continues to have the ribs upon the right side. Seems to be the third rib. There is a possibility for cervical radiculopathy. Started on gabapentin as well as a muscle relaxer. We discussed increasing the ergonomics. Patient continues to reach for certain things we discussed proper lifting mechanics. Patient has ibuprofen for breakthrough pain. Patient come back and see me again in 4-8 weeks.  >>ASSESSMENT AND PLAN FOR NONALLOPATHIC LESION-RIB CAGE WRITTEN ON 12/03/2015  1:13 PM BY Judi Saa, DO   Decision today to treat with OMT was based on Physical Exam  After verbal consent patient was treated with HVLA, ME, FPR techniques in cervical, thoracic and rib areas  Patient tolerated the procedure well with improvement in symptoms  Patient given exercises, stretches and lifestyle modifications  See medications in patient instructions if given  Patient will follow up in 4-8 weeks

## 2015-12-03 NOTE — Assessment & Plan Note (Signed)
  Decision today to treat with OMT was based on Physical Exam  After verbal consent patient was treated with HVLA, ME, FPR techniques in cervical, thoracic and rib areas  Patient tolerated the procedure well with improvement in symptoms  Patient given exercises, stretches and lifestyle modifications  See medications in patient instructions if given  Patient will follow up in 4-8 weeks

## 2015-12-30 NOTE — Progress Notes (Signed)
[name] [address] [name], [place] [address] Phone: [phone] Subjective:     CC: neck and chest wall follow-up  [id number]  Christina [name] is a 45 y.o. female coming in with complaint of right slipped rib syndrome. Patient was doing well but unfortunately and has only been 1 months and fixing patient. Patient states worsening symptoms of the neck, upper back, lower back. Does not know exactly what is going on. Maybe feeling a little under the weather. Mild increase in fatigue. Has been a little more busy work. Patient denies any significant radiation but sometimes feels that her left leg has a tingling sensation but it is very intermittent.  Past Medical History:  Diagnosis Date  . Hypercholesteremia   . Hyperthyroidism   . Migraines    with asthma  . Slipping rib syndrome    Past Surgical History:  Procedure Laterality Date  . BREAST BIOPSY Right 06-26-14   benign per patient   Social History  Substance Use Topics  . Smoking status: Never Smoker  . Smokeless tobacco: Never Used  . Alcohol use No   Allergies  Allergen Reactions  . Topamax [Topiramate]     Pain in legs, SOB, and almost passed out   Family History  Problem Relation Age of Onset  . Diabetes Mother   . Hypertension Mother   . Colon cancer Mother   . Breast cancer Mother   . Prostate cancer Father      Past medical history, social, surgical and family history all reviewed in electronic medical record.   Review of Systems: No headache, visual changes, nausea, vomiting, diarrhea, constipation, dizziness, abdominal pain, skin rash, fevers, chills, night sweats, weight loss, swollen lymph nodes, body aches, joint swelling,chest pain, shortness of breath, mood changes.   Objective  Blood pressure 112/78, pulse 74, height 5\' 7"  (1.702 m), weight 164 lb 3.2 oz (74.5 kg).  Systems examined below as of 12/31/15 General: NAD A&O x3 mood, affect normal  HEENT: Pupils  equal, extraocular movements intact no nystagmus Respiratory: not short of breath at rest or with speaking Cardiovascular: No lower extremity edema, non tender Skin: Warm dry intact with no signs of infection or rash on extremities or on axial skeleton. Abdomen: Soft nontender, no masses Neuro: Cranial nerves  intact, neurovascularly intact in all extremities with 2+ DTRs and 2+ pulses. Lymph: No lymphadenopathy appreciated today  Gait normal with good balance and coordination.  MSK: Non tender with full range of motion and good stability and symmetric strength and tone of shoulders, elbows, wrist,  knee hips and ankles bilaterally.   .  Neck: Inspection unremarkable. No palpable stepoffs. Negative Spurling's maneuver. Increasing tightness bilaterally with lacking the last 5 in all planes of range of motion Grip strength and sensation normal in bilateral hands Strength good C4 to T1 distribution No sensory change to C4 to T1 Negative Hoffman sign bilaterally Reflexes normal Continued tightness in the right trapezius  Back Exam:  Inspection: Unremarkable  Motion: Flexion 45 deg, Extension 25 deg, Side Bending to 45 deg bilaterally,  Rotation to 45 deg bilaterally  SLR laying: Negative  XSLR laying: Negative  Palpable tenderness: Tender to palpation in the paraspinal musculature of the lumbar spine bilaterally. FABER: negative. Sensory change: Gross sensation intact to all lumbar and sacral dermatomes.  Reflexes: 2+ at both patellar tendons, 2+ at achilles tendons, Babinski's downgoing.  Strength at foot  Plantar-flexion: 5/5 Dorsi-flexion: 5/5 Eversion: 5/5 Inversion: 5/5  Leg  strength  Quad: 5/5 Hamstring: 5/5 Hip flexor: 5/5 Hip abductors: 5/5  Gait unremarkable.   Osteopathic findings C2 flexed rotated and side bent right C6 flexed rotated and side bent left T3 extended rotated and side bent right with inhaled third rib  L3 flexed rotated and side bent right  Sacrum  left on left.     Impression and Recommendations:     This case required medical decision making of moderate complexity.

## 2015-12-31 ENCOUNTER — Ambulatory Visit (INDEPENDENT_AMBULATORY_CARE_PROVIDER_SITE_OTHER): Payer: Federal, State, Local not specified - PPO | Admitting: Family Medicine

## 2015-12-31 ENCOUNTER — Encounter: Payer: Self-pay | Admitting: Family Medicine

## 2015-12-31 VITALS — BP 112/78 | HR 74 | Ht 67.0 in | Wt 164.2 lb

## 2015-12-31 DIAGNOSIS — M62838 Other muscle spasm: Secondary | ICD-10-CM

## 2015-12-31 DIAGNOSIS — M545 Low back pain, unspecified: Secondary | ICD-10-CM | POA: Insufficient documentation

## 2015-12-31 DIAGNOSIS — M5416 Radiculopathy, lumbar region: Secondary | ICD-10-CM | POA: Insufficient documentation

## 2015-12-31 DIAGNOSIS — M940 Chondrocostal junction syndrome [Tietze]: Secondary | ICD-10-CM

## 2015-12-31 DIAGNOSIS — M5442 Lumbago with sciatica, left side: Secondary | ICD-10-CM | POA: Diagnosis not present

## 2015-12-31 DIAGNOSIS — M999 Biomechanical lesion, unspecified: Secondary | ICD-10-CM

## 2015-12-31 MED ORDER — GABAPENTIN 300 MG PO CAPS
300.0000 mg | ORAL_CAPSULE | Freq: Every day | ORAL | 1 refills | Status: DC
Start: 2015-12-31 — End: 2016-03-25

## 2015-12-31 MED ORDER — PREDNISONE 50 MG PO TABS: 50.0000 mg | ORAL_TABLET | Freq: Every day | ORAL | 0 refills | Status: DC

## 2015-12-31 NOTE — Assessment & Plan Note (Signed)
>>  ASSESSMENT AND PLAN FOR LOW BACK PAIN WRITTEN ON 12/31/2015  3:28 PM BY Antoine Primas M, DO  Patient reports mild sciatica. Seems to be intermittent. No significant weakness. Negative straight leg test today. So given prednisone at this time. Patient will come back and see me again within 2 weeks. Worsening symptoms may need further imaging.

## 2015-12-31 NOTE — Assessment & Plan Note (Signed)
Worsening symptoms. Given prednisone in case patient continues to have problem.

## 2015-12-31 NOTE — Patient Instructions (Addendum)
Good to see you  Ice is your friend Prednisone daily for 5 days then as needed.  Stay active See me again in though in 2-3 weeks to make sure you are doing well.

## 2015-12-31 NOTE — Assessment & Plan Note (Addendum)
>>  ASSESSMENT AND PLAN FOR SLIPPED RIB SYNDROME WRITTEN ON 12/31/2015  3:25 PM BY Antoine Primas M, DO  Continues to be a problem. Can be secondary distress. Patient does do a lot of repetitive activity at work. We discussed objective is to avoid. Patient continued to be active otherwise. Follow-up again 2- 3 weeks.  >>ASSESSMENT AND PLAN FOR NONALLOPATHIC LESION-RIB CAGE WRITTEN ON 12/31/2015  3:26 PM BY Judi Saa, DO   Decision today to treat with OMT was based on Physical Exam  After verbal consent patient was treated with HVLA, ME, FPR techniques in cervical, thoracic and rib areas  Patient tolerated the procedure well with improvement in symptoms  Patient given exercises, stretches and lifestyle modifications  See medications in patient instructions if given  Patient will follow up in 2-3 weeks

## 2015-12-31 NOTE — Assessment & Plan Note (Signed)
  Decision today to treat with OMT was based on Physical Exam  After verbal consent patient was treated with HVLA, ME, FPR techniques in cervical, thoracic and rib areas  Patient tolerated the procedure well with improvement in symptoms  Patient given exercises, stretches and lifestyle modifications  See medications in patient instructions if given  Patient will follow up in 2-3 weeks

## 2015-12-31 NOTE — Assessment & Plan Note (Signed)
Patient reports mild sciatica. Seems to be intermittent. No significant weakness. Negative straight leg test today. So given prednisone at this time. Patient will come back and see me again within 2 weeks. Worsening symptoms may need further imaging.

## 2016-01-14 ENCOUNTER — Ambulatory Visit: Payer: Federal, State, Local not specified - PPO | Admitting: Family Medicine

## 2016-01-21 ENCOUNTER — Encounter: Payer: Self-pay | Admitting: Family Medicine

## 2016-01-21 ENCOUNTER — Ambulatory Visit (INDEPENDENT_AMBULATORY_CARE_PROVIDER_SITE_OTHER): Payer: Federal, State, Local not specified - PPO | Admitting: Family Medicine

## 2016-01-21 VITALS — BP 122/80 | HR 87 | Ht 67.0 in | Wt 160.0 lb

## 2016-01-21 DIAGNOSIS — M999 Biomechanical lesion, unspecified: Secondary | ICD-10-CM

## 2016-01-21 DIAGNOSIS — M940 Chondrocostal junction syndrome [Tietze]: Secondary | ICD-10-CM

## 2016-01-21 NOTE — Assessment & Plan Note (Signed)
  Decision today to treat with OMT was based on Physical Exam  After verbal consent patient was treated with HVLA, ME, FPR techniques in cervical, thoracic and rib areas  Patient tolerated the procedure well with improvement in symptoms  Patient given exercises, stretches and lifestyle modifications  See medications in patient instructions if given  Patient will follow up in 4-6 weeks

## 2016-01-21 NOTE — Patient Instructions (Signed)
God to see you  Ice is your friend 2 tennis ball in a tube sock and lay on them at the base of your skull See me again in 4-5 weeks Love to meet your boys Happy New Year!

## 2016-01-21 NOTE — Assessment & Plan Note (Addendum)
>>  ASSESSMENT AND PLAN FOR SLIPPED RIB SYNDROME WRITTEN ON 01/21/2016  1:46 PM BY Antoine Primas M, DO  Continues down the slipped rib [place] on the third and vertebrae. Discussed core, ergonomics and posture. Patient will continue to be active. Follow-up again in 4-6 weeks. Does respond well to manipulation.  >>ASSESSMENT AND PLAN FOR NONALLOPATHIC LESION-RIB CAGE WRITTEN ON 01/21/2016  1:47 PM BY Antoine Primas M, DO   Decision today to treat with OMT was based on Physical Exam  After verbal consent patient was treated with HVLA, ME, FPR techniques in cervical, thoracic and rib areas  Patient tolerated the procedure well with improvement in symptoms  Patient given exercises, stretches and lifestyle modifications  See medications in patient instructions if given  Patient will follow up in 4-6 weeks

## 2016-01-21 NOTE — Progress Notes (Signed)
[name] [place], [place] [address] Phone: [phone] Subjective:     CC: neck and chest wall follow-up  [id number]  Christina [name] is a 45 y.o. female coming in with complaint of right slipped rib syndrome.  Doing better overall.  Ice has helped, gabapentin is helping. No new symptoms but still some tightness. Migraines are better with gabapentin   Past Medical History:  Diagnosis Date  . Hypercholesteremia   . Hyperthyroidism   . Migraines    with asthma  . Slipping rib syndrome    Past Surgical History:  Procedure Laterality Date  . BREAST BIOPSY Right 06-26-14   benign per patient   Social History  Substance Use Topics  . Smoking status: Never Smoker  . Smokeless tobacco: Never Used  . Alcohol use No   Allergies  Allergen Reactions  . Topamax [Topiramate]     Pain in legs, SOB, and almost passed out   Family History  Problem Relation Age of Onset  . Diabetes Mother   . Hypertension Mother   . Colon cancer Mother   . Breast cancer Mother   . Prostate cancer Father      Past medical history, social, surgical and family history all reviewed in electronic medical record.   Review of Systems: No headache, visual changes, nausea, vomiting, diarrhea, constipation, dizziness, abdominal pain, skin rash, fevers, chills, night sweats, weight loss, swollen lymph nodes, body aches, joint swelling, muscle aches, chest pain, shortness of breath, mood changes.    Objective  Blood pressure 122/80, weight 160 lb (72.6 kg).  Systems examined below as of 01/21/16 General: NAD A&O x3 mood, affect normal  HEENT: Pupils equal, extraocular movements intact no nystagmus Respiratory: not short of breath at rest or with speaking Cardiovascular: No lower extremity edema, non tender Skin: Warm dry intact with no signs of infection or rash on extremities or on axial skeleton. Abdomen: Soft nontender, no masses Neuro: Cranial nerves   intact, neurovascularly intact in all extremities with 2+ DTRs and 2+ pulses. Lymph: No lymphadenopathy appreciated today  Gait normal with good balance and coordination.  MSK: Non tender with full range of motion and good stability and symmetric strength and tone of shoulders, elbows, wrist,  knee hips and ankles bilaterally.  .   .  Neck: Inspection unremarkable. No palpable stepoffs. Negative Spurling's maneuver. Full neck range of motion Grip strength and sensation normal in bilateral hands Strength good C4 to T1 distribution No sensory change to C4 to T1 Negative Hoffman sign bilaterally Reflexes normal Continued tenderness of the right trapezius   Back Exam:  Inspection: Unremarkable  Motion: Flexion 45 deg, Extension 25 deg, Side Bending to 45 deg bilaterally,  Rotation to 45 deg bilaterally  SLR laying: Negative  XSLR laying: Negative  Palpable tenderness: Mild tenderness still at the thoracolumbar juncture as well as the lumbosacral juncture on the right side FABER: negative. Sensory change: Gross sensation intact to all lumbar and sacral dermatomes.  Reflexes: 2+ at both patellar tendons, 2+ at achilles tendons, Babinski's downgoing.  Strength at foot  Plantar-flexion: 5/5 Dorsi-flexion: 5/5 Eversion: 5/5 Inversion: 5/5  Leg strength  Quad: 5/5 Hamstring: 5/5 Hip flexor: 5/5 Hip abductors: 4/5  Gait unremarkable.   Osteopathic findings Cervical C2 flexed rotated and side bent right C4 flexed rotated and side bent left C6 flexed rotated and side bent left T3 extended rotated and side bent right inhaled third rib T6 extended rotated and side  bent left L2 flexed rotated and side bent right Sacrum right on right      Impression and Recommendations:     This case required medical decision making of moderate complexity.

## 2016-02-17 NOTE — Progress Notes (Signed)
[name] [address] [place], [place] [address] Phone: [phone] Subjective:     CC: Neck pain follow-up  [id number]  Christina [name] is a 46 y.o. female coming in with complaint of right slipped rib syndrome.  Overall doing much better. Patient did have an illness at last exam but seems to be doing relatively well. Denies any numbness. Denies any radiation down the arms. Some mild decrease in Maybee strength she stated for a little bit but now back to her normal self. Still some mild tightness of the upper back and neck.   Past Medical History:  Diagnosis Date  . Hypercholesteremia   . Hyperthyroidism   . Migraines    with asthma  . Slipping rib syndrome    Past Surgical History:  Procedure Laterality Date  . BREAST BIOPSY Right 06-26-14   benign per patient   Social History  Substance Use Topics  . Smoking status: Never Smoker  . Smokeless tobacco: Never Used  . Alcohol use No   Allergies  Allergen Reactions  . Topamax [Topiramate]     Pain in legs, SOB, and almost passed out   Family History  Problem Relation Age of Onset  . Diabetes Mother   . Hypertension Mother   . Colon cancer Mother   . Breast cancer Mother   . Prostate cancer Father      Past medical history, social, surgical and family history all reviewed in electronic medical record.   Review of Systems: No headache, visual changes, nausea, vomiting, diarrhea, constipation, dizziness, abdominal pain, skin rash, fevers, chills, night sweats, weight loss, swollen lymph nodes, body aches, joint swelling, muscle aches, chest pain, shortness of breath, mood changes.    Objective  Blood pressure 118/82, pulse 81, height 5\' 7"  (1.702 m), weight 169 lb (76.7 kg), SpO2 100 %.  Systems examined below as of 02/18/16 General: NAD A&O x3 mood, affect normal  HEENT: Pupils equal, extraocular movements intact no nystagmus Respiratory: not short of breath at rest or with  speaking Cardiovascular: No lower extremity edema, non tender Skin: Warm dry intact with no signs of infection or rash on extremities or on axial skeleton. Abdomen: Soft nontender, no masses Neuro: Cranial nerves  intact, neurovascularly intact in all extremities with 2+ DTRs and 2+ pulses. Lymph: No lymphadenopathy appreciated today  Gait normal with good balance and coordination.  MSK: Non tender with full range of motion and good stability and symmetric strength and tone of shoulders, elbows, wrist,  knee hips and ankles bilaterally.  .   .  Neck: Inspection unremarkable. No palpable stepoffs. Negative Spurling's maneuver. Full neck range of motion Grip strength and sensation normal in bilateral hands Strength good C4 to T1 distribution No sensory change to C4 to T1 Negative Hoffman sign bilaterally Reflexes normal less tightness in the trapezius from previous exam.  Back Exam:  Inspection: Unremarkable  Motion: Flexion 45 deg, Extension 45 deg, Side Bending to 45 deg bilaterally,  Rotation to 45 deg bilaterally  SLR laying: Negative  XSLR laying: Negative  Palpable tenderness: Continue mild tenderness over the lumbar spine mostly loading of the sacroiliac joints.. FABER: negative. Sensory change: Gross sensation intact to all lumbar and sacral dermatomes.  Reflexes: 2+ at both patellar tendons, 2+ at achilles tendons, Babinski's downgoing.  Strength at foot  Plantar-flexion: 5/5 Dorsi-flexion: 5/5 Eversion: 5/5 Inversion: 5/5  Leg strength  Quad: 5/5 Hamstring: 5/5 Hip flexor: 5/5 Hip abductors: 5/5  Gait unremarkable.  Osteopathic findings Cervical C2 flexed rotated and side bent right C4 flexed rotated and side bent left T3 extended rotated and side bent right inhaled third rib T7 extended rotated and side bent left L2 flexed rotated and side bent right Sacrum right on right       Impression and Recommendations:     This case required medical decision  making of moderate complexity.

## 2016-02-18 ENCOUNTER — Ambulatory Visit (INDEPENDENT_AMBULATORY_CARE_PROVIDER_SITE_OTHER): Payer: Federal, State, Local not specified - PPO | Admitting: Family Medicine

## 2016-02-18 ENCOUNTER — Ambulatory Visit (INDEPENDENT_AMBULATORY_CARE_PROVIDER_SITE_OTHER): Payer: Federal, State, Local not specified - PPO | Admitting: Neurology

## 2016-02-18 ENCOUNTER — Encounter: Payer: Self-pay | Admitting: Family Medicine

## 2016-02-18 ENCOUNTER — Encounter: Payer: Self-pay | Admitting: Neurology

## 2016-02-18 VITALS — BP 122/72 | HR 80 | Ht 67.0 in | Wt 170.9 lb

## 2016-02-18 VITALS — BP 118/82 | HR 81 | Ht 67.0 in | Wt 169.0 lb

## 2016-02-18 DIAGNOSIS — G43409 Hemiplegic migraine, not intractable, without status migrainosus: Secondary | ICD-10-CM | POA: Diagnosis not present

## 2016-02-18 DIAGNOSIS — M940 Chondrocostal junction syndrome [Tietze]: Secondary | ICD-10-CM

## 2016-02-18 DIAGNOSIS — M999 Biomechanical lesion, unspecified: Secondary | ICD-10-CM | POA: Diagnosis not present

## 2016-02-18 DIAGNOSIS — G43109 Migraine with aura, not intractable, without status migrainosus: Secondary | ICD-10-CM | POA: Diagnosis not present

## 2016-02-18 NOTE — Progress Notes (Signed)
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  Sometimes has strange sensation in left arm without headache.  Lasts 30 to 60 minutes and may occur 3 times a week.  It does not bother her.  No weakness.   HISTORY: On 03/10/14, she developed sudden onset of vision loss in the left eye.  Over the next 30 minutes, she developed clumsiness and heaviness of her right arm and hand.  There was no speech or language disturbance.  This lasted a couple of hours.  Several hours later, she developed a severe 8-9/10 nonthrobbing left sided headache.  It was associated with nausea, photophobia and phonophobia.  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  MRI of brain performed on 03/11/14 was normal.  She was advised to start ASA 81mg  daily.  She received a cocktail, which eased the pain.  The headache persisted without aura symptoms at an intensity of 5/10.  Since it did not completely resolve, she returned to the ED about a week later.  She received an Imitrex injection which caused the left vision loss aura, which persisted off an on for a few days.  The headache persisted for 2 weeks before it completely resolved.  She did not have the focal loss of fine-motor skills in the right hand.     She had other episodes presenting with left sided weakness as well.  She has had left sided symptoms involving the arm and leg, presenting as constant feeling of weakness, tingling in the wrist or ankle, and generalized aches.  Symptoms fluctuate during the day. She denies neck pain or pain shooting down the arm or leg.  She also described an episode  where she heard crackling in her ears, followed by flashing lights lasting 10 seconds.     She has had occasional recurrent episodes of headache without the visual aura and focal weakness.  It is bi-frontal and throbbing, about 5/10.  It is associated with photophobia and phonophobia, but not nausea.  She will take naproxen 500mg  and cup of coffee which eases the pain.  It will last a couple of days.   Prior medications:  topiramate (dizziness)   She has no prior history of migraine.  Her father had migraines.  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  However, she had subsequent ED visits where she presented with left sided weakness, so I felt her symptoms were not related to the left ICA and vascular consult was cancelled.    Repeat carotid doppler from 02/19/15 showed less than 50% bilateral ICA stenosis.  Due to numbness and tingling, labs were ordered.  B12 was 226 and methylmalonic acid level was 1.2.  She was advised to start B12 1020mcg daily.  She also has been taking B6 and vitamin D.  Paresthesias have improved.    PAST MEDICAL HISTORY: Past Medical History:  Diagnosis Date  . Hypercholesteremia   . Hyperthyroidism   .  Migraines    with asthma  . Slipping rib syndrome     MEDICATIONS: Current Outpatient Prescriptions on File Prior to Visit  Medication Sig Dispense Refill  . aspirin 81 MG tablet Take 81 mg by mouth daily.    Garlan Fillers Peroxide (EAR DROPS OT) Place in ear(s) as needed.     . cholecalciferol (VITAMIN D) 1000 units tablet Take 1,000 Units by mouth daily.    Marland Kitchen gabapentin (NEURONTIN) 300 MG capsule Take 1 capsule (300 mg total) by mouth at bedtime. 90 capsule 1  . Ibuprofen-Famotidine (DUEXIS) 800-26.6 MG TABS Take one pill three times daily 270 tablet 3  . Iron  TABS Take by mouth.    . methimazole (TAPAZOLE) 5 MG tablet On M W F 25 tablet 2  . Pyridoxine HCl (B-6) 100 MG TABS Take by mouth.    Marland Kitchen tiZANidine (ZANAFLEX) 4 MG tablet Take 1 tablet (4 mg total) by mouth every 6 (six) hours as needed for muscle spasms. 30 tablet 0  . vitamin B-12 (CYANOCOBALAMIN) 1000 MCG tablet Take 1,000 mcg by mouth daily.    . vitamin C (ASCORBIC ACID) 500 MG tablet Take 500 mg by mouth daily.     No current facility-administered medications on file prior to visit.     ALLERGIES: Allergies  Allergen Reactions  . Topamax [Topiramate]     Pain in legs, SOB, and almost passed out    FAMILY HISTORY: Family History  Problem Relation Age of Onset  . Diabetes Mother   . Hypertension Mother   . Colon cancer Mother   . Breast cancer Mother   . Prostate cancer Father     SOCIAL HISTORY: Social History   Social History  . Marital status: Married    Spouse name: N/A  . Number of children: 1  . Years of education: 31   Occupational History  . Mail Processor    Social History Main Topics  . Smoking status: Never Smoker  . Smokeless tobacco: Never Used  . Alcohol use No  . Drug use: No  . Sexual activity: Yes    Partners: Male    Birth control/ protection: Condom   Other Topics Concern  . Not on file   Social History Narrative   Born and raised in [place]. Currently resides in a house with her child. No pets. Fun: Go to the movies.    Denies religious beliefs effecting health care.     REVIEW OF SYSTEMS: Constitutional: No fevers, chills, or sweats, no generalized fatigue, change in appetite Eyes: No visual changes, double vision, eye pain Ear, nose and throat: No hearing loss, ear pain, nasal congestion, sore throat Cardiovascular: No chest pain, palpitations Respiratory:  No shortness of breath at rest or with exertion, wheezes GastrointestinaI: No nausea, vomiting, diarrhea, abdominal pain, fecal incontinence Genitourinary:  No dysuria,  urinary retention or frequency Musculoskeletal:  No neck pain, back pain Integumentary: No rash, pruritus, skin lesions Neurological: as above Psychiatric: No depression, insomnia, anxiety Endocrine: No palpitations, fatigue, diaphoresis, mood swings, change in appetite, change in weight, increased thirst Hematologic/Lymphatic:  No purpura, petechiae. Allergic/Immunologic: no itchy/runny eyes, nasal congestion, recent allergic reactions, rashes  PHYSICAL EXAM: Vitals:   02/18/16 1430  BP: 122/72  Pulse: 80   General: No acute distress.  Patient appears well-groomed.  normal body habitus. Head:  Normocephalic/atraumatic Eyes:  Fundi examined but not visualized Neck: supple, no paraspinal tenderness, full range of motion Heart:  Regular rate and rhythm Lungs:  Clear  to auscultation bilaterally Back: No paraspinal tenderness Neurological Exam: alert and oriented to person, place, and time. Attention span and concentration intact, recent and remote memory intact, fund of knowledge intact.  Speech fluent and not dysarthric, language intact.  CN II-XII intact. Bulk and tone normal, muscle strength 5/5 throughout.  Sensation to light touch  intact.  Deep tendon reflexes 2+ throughout.  Finger to nose testing intact.  Gait normal  IMPRESSION: Hemiplegic migraine Migraine with visual aura  PLAN: Even though last carotid doppler was unremarkable, I recommend continuing ASA 81mg  daily and repeat carotid doppler again in one year with follow up in office afterwards.  15 minutes spent face to face with patient, over 50% spent discussing diagnosis and when it would be appropriate to go to the ED.  Metta Clines, DO  CC:  Mauricio Po, FNP

## 2016-02-18 NOTE — Patient Instructions (Signed)
Good to see you  Doing great  No changes at this time See me again in 6-10 weeks!

## 2016-02-18 NOTE — Patient Instructions (Signed)
1.  Repeat carotid doppler in one year.   2.  Follow up afterwards.

## 2016-02-19 NOTE — Assessment & Plan Note (Addendum)
>>  ASSESSMENT AND PLAN FOR SLIPPED RIB SYNDROME WRITTEN ON 02/19/2016  6:57 AM BY Antoine Primas M, DO  Once again slipped rib syndrome noted on the right side. Seem to be only the T3 area compared to the T6 area previously. Discussed with patient at great length. We discussed icing regimen and home exercises. We discussed which activities doing which ones to avoid. Patient is doing much better and we'll space out to 6-8 week intervals.  >>ASSESSMENT AND PLAN FOR NONALLOPATHIC LESION-RIB CAGE WRITTEN ON 02/19/2016  6:57 AM BY Judi Saa, DO  Decision today to treat with OMT was based on Physical Exam  After verbal consent patient was treated with HVLA, ME, FPR techniques in cervical, thoracic, rib lumbar and sacral areas  Patient tolerated the procedure well with improvement in symptoms  Patient given exercises, stretches and lifestyle modifications  See medications in patient instructions if given  Patient will follow up in 6-8 weeks

## 2016-02-19 NOTE — Assessment & Plan Note (Addendum)
Decision today to treat with OMT was based on Physical Exam  After verbal consent patient was treated with HVLA, ME, FPR techniques in cervical, thoracic, rib lumbar and sacral areas  Patient tolerated the procedure well with improvement in symptoms  Patient given exercises, stretches and lifestyle modifications  See medications in patient instructions if given  Patient will follow up in 6-8 weeks 

## 2016-03-11 ENCOUNTER — Ambulatory Visit: Payer: Federal, State, Local not specified - PPO | Admitting: Endocrinology

## 2016-03-25 ENCOUNTER — Encounter: Payer: Self-pay | Admitting: Endocrinology

## 2016-03-25 ENCOUNTER — Ambulatory Visit (INDEPENDENT_AMBULATORY_CARE_PROVIDER_SITE_OTHER): Payer: Federal, State, Local not specified - PPO | Admitting: Endocrinology

## 2016-03-25 VITALS — BP 112/64 | HR 81 | Ht 67.0 in | Wt 171.0 lb

## 2016-03-25 DIAGNOSIS — E0590 Thyrotoxicosis, unspecified without thyrotoxic crisis or storm: Secondary | ICD-10-CM | POA: Diagnosis not present

## 2016-03-25 LAB — T4, FREE: FREE T4: 0.77 ng/dL (ref 0.60–1.60)

## 2016-03-25 LAB — TSH: TSH: 2.35 u[IU]/mL (ref 0.35–4.50)

## 2016-03-25 NOTE — Patient Instructions (Signed)
A thyroid blood test is requested for you today.  We'll let you know about the results.   if ever you have fever while taking methimazole, stop it and call us, because of the risk of a rare side-effect.   Please come back for a follow-up appointment in 6 months.   

## 2016-03-25 NOTE — Progress Notes (Signed)
Subjective:    Patient ID: Christina [name]. Christina [name], female    DOB: 06-25-1970, 46 y.o.   MRN: [id number]  HPI Pt returns for f/u of hyperthyroidism (dx'ed in early 2015, in [place]; nuc med scan showed diffuse uptake (45% at 24 hrs); US showed diffuse goiter, with multiple very small nodules; pt is unaware why tapazole was chosen as rx, but she wishes to continue). she takes tapazole as rx'ed.  pt states she feels well in general.   Past Medical History:  Diagnosis Date  . Hypercholesteremia   . Hyperthyroidism   . Migraines    with asthma  . Slipping rib syndrome     Past Surgical History:  Procedure Laterality Date  . BREAST BIOPSY Right 06-26-14   benign per patient    Social History   Social History  . Marital status: Married    Spouse name: N/A  . Number of children: 1  . Years of education: 74   Occupational History  . Mail Processor    Social History Main Topics  . Smoking status: Never Smoker  . Smokeless tobacco: Never Used  . Alcohol use No  . Drug use: No  . Sexual activity: Yes    Partners: Male    Birth control/ protection: Condom   Other Topics Concern  . Not on file   Social History Narrative   Born and raised in [place]. Currently resides in a house with her child. No pets. Fun: Go to the movies.    Denies religious beliefs effecting health care.     Current Outpatient Prescriptions on File Prior to Visit  Medication Sig Dispense Refill  . aspirin 81 MG tablet Take 81 mg by mouth daily.    Garlan Fillers Peroxide (EAR DROPS OT) Place in ear(s) as needed.     . cholecalciferol (VITAMIN D) 1000 units tablet Take 1,000 Units by mouth daily.    . Iron TABS Take by mouth.    . methimazole (TAPAZOLE) 5 MG tablet On M W F 25 tablet 2  . Pyridoxine HCl (B-6) 100 MG TABS Take by mouth.    . vitamin B-12 (CYANOCOBALAMIN) 1000 MCG tablet Take 1,000 mcg by mouth daily.    . vitamin C (ASCORBIC ACID) 500 MG tablet Take 500 mg by mouth daily.    Marland Kitchen tiZANidine  (ZANAFLEX) 4 MG tablet Take 1 tablet (4 mg total) by mouth every 6 (six) hours as needed for muscle spasms. (Patient not taking: Reported on 03/25/2016) 30 tablet 0   No current facility-administered medications on file prior to visit.     Allergies  Allergen Reactions  . Topamax [Topiramate]     Pain in legs, SOB, and almost passed out    Family History  Problem Relation Age of Onset  . Diabetes Mother   . Hypertension Mother   . Colon cancer Mother   . Breast cancer Mother   . Prostate cancer Father     BP 112/64   Pulse 81   Ht 5\' 7"  (1.702 m)   Wt 171 lb (77.6 kg)   SpO2 99%   BMI 26.78 kg/m    Review of Systems Denies fever    Objective:   Physical Exam VITAL SIGNS:  See vs page.   GENERAL: no distress.  NECK: thyroid is slightly and diffusely enlarged.   Lab Results  Component Value Date   TSH 2.35 03/25/2016      Assessment & Plan:  Hyperthyroidism: well-controlled.  Please continue the same  medication.

## 2016-04-02 ENCOUNTER — Emergency Department (HOSPITAL_COMMUNITY)
Admission: EM | Admit: 2016-04-02 | Discharge: 2016-04-02 | Disposition: A | Discharge status: Home / Self Care | Payer: Federal, State, Local not specified - PPO | Attending: Emergency Medicine | Admitting: Emergency Medicine

## 2016-04-02 ENCOUNTER — Encounter (HOSPITAL_COMMUNITY): Payer: Self-pay | Admitting: *Deleted

## 2016-04-02 DIAGNOSIS — Z79899 Other long term (current) drug therapy: Secondary | ICD-10-CM | POA: Diagnosis not present

## 2016-04-02 DIAGNOSIS — Y999 Unspecified external cause status: Secondary | ICD-10-CM | POA: Diagnosis not present

## 2016-04-02 DIAGNOSIS — M62830 Muscle spasm of back: Secondary | ICD-10-CM

## 2016-04-02 DIAGNOSIS — Y92410 Unspecified street and highway as the place of occurrence of the external cause: Secondary | ICD-10-CM | POA: Diagnosis not present

## 2016-04-02 DIAGNOSIS — Y939 Activity, unspecified: Secondary | ICD-10-CM | POA: Insufficient documentation

## 2016-04-02 DIAGNOSIS — S3992XA Unspecified injury of lower back, initial encounter: Secondary | ICD-10-CM | POA: Diagnosis not present

## 2016-04-02 DIAGNOSIS — Z7982 Long term (current) use of aspirin: Secondary | ICD-10-CM | POA: Diagnosis not present

## 2016-04-02 MED ORDER — DICLOFENAC SODIUM 50 MG PO TBEC: 50.0000 mg | DELAYED_RELEASE_TABLET | Freq: Two times a day (BID) | ORAL | 0 refills | Status: DC

## 2016-04-02 MED ORDER — CYCLOBENZAPRINE HCL 5 MG PO TABS: 5.0000 mg | ORAL_TABLET | Freq: Three times a day (TID) | ORAL | 0 refills | Status: DC | PRN

## 2016-04-02 MED ORDER — CYCLOBENZAPRINE HCL 10 MG PO TABS
10.0000 mg | ORAL_TABLET | Freq: Once | ORAL | Status: AC
  Administered 2016-04-02: 10 mg via ORAL
  Filled 2016-04-02: qty 1
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
## 2016-04-02 NOTE — ED Notes (Signed)
Pt stable, ambulatory, states understanding of discharge instructions 

## 2016-04-02 NOTE — Discharge Instructions (Signed)
Do not drive while taking the muscle relaxant as it will make you sleepy. °

## 2016-04-09 ENCOUNTER — Encounter: Payer: Self-pay | Admitting: Family

## 2016-04-09 ENCOUNTER — Ambulatory Visit (INDEPENDENT_AMBULATORY_CARE_PROVIDER_SITE_OTHER): Payer: Federal, State, Local not specified - PPO | Admitting: Family

## 2016-04-09 DIAGNOSIS — M545 Low back pain, unspecified: Secondary | ICD-10-CM

## 2016-04-09 MED ORDER — CYCLOBENZAPRINE HCL 10 MG PO TABS
5.0000 mg | ORAL_TABLET | Freq: Three times a day (TID) | ORAL | 0 refills | Status: DC | PRN
Start: 2016-04-09 — End: 2016-07-29

## 2016-04-09 MED ORDER — IBUPROFEN-FAMOTIDINE 800-26.6 MG PO TABS
1.0000 | ORAL_TABLET | Freq: Three times a day (TID) | ORAL | 1 refills | Status: DC | PRN
Start: 2016-04-09 — End: 2017-08-24

## 2016-04-09 NOTE — Patient Instructions (Signed)
Thank you for choosing [facility]!  Ice / moist heat x 20 minutes every 2 hours and as needed or following activity  Exercises 1-2 times per day as instructed.   Medications  Duexis - 1 tablet 3 times per day for the next 5-7 days and then as needed.  You will receive a call from [facility] regarding your Pennsaid/Duexis/Vimovo. The medication will be mailed to you and should cost you no more than $10 per item or possibly free depending upon your insurance.   Your prescription(s) have been submitted to your pharmacy or been printed and provided for you. Please take as directed and contact our office if you believe you are having problem(s) with the medication(s) or have any questions.  If your symptoms worsen or fail to improve, please contact our office for further instruction, or in case of emergency go directly to the emergency room at the closest medical facility.    Low Back Strain Rehab Ask your health care provider which exercises are safe for you. Do exercises exactly as told by your health care provider and adjust them as directed. It is normal to feel mild stretching, pulling, tightness, or discomfort as you do these exercises, but you should stop right away if you feel sudden pain or your pain gets worse. Do not begin these exercises until told by your health care provider. Stretching and range of motion exercises These exercises warm up your muscles and joints and improve the movement and flexibility of your back. These exercises also help to relieve pain, numbness, and tingling. Exercise A: Single knee to chest   1. Lie on your back on a firm surface with both legs straight. 2. Bend one of your knees. Use your hands to move your knee up toward your chest until you feel a gentle stretch in your lower back and buttock.  Hold your leg in this position by holding onto the front of your knee.  Keep your other leg as straight as possible. 3. Hold for __________  seconds. 4. Slowly return to the starting position. 5. Repeat with your other leg. Repeat __________ times. Complete this exercise __________ times a day. Exercise B: Prone extension on elbows   1. Lie on your abdomen on a firm surface. 2. Prop yourself up on your elbows. 3. Use your arms to help lift your chest up until you feel a gentle stretch in your abdomen and your lower back.  This will place some of your body weight on your elbows. If this is uncomfortable, try stacking pillows under your chest.  Your hips should stay down, against the surface that you are lying on. Keep your hip and back muscles relaxed. 4. Hold for __________ seconds. 5. Slowly relax your upper body and return to the starting position. Repeat __________ times. Complete this exercise __________ times a day. Strengthening exercises These exercises build strength and endurance in your back. Endurance is the ability to use your muscles for a long time, even after they get tired. Exercise C: Pelvic tilt  1. Lie on your back on a firm surface. Bend your knees and keep your feet flat. 2. Tense your abdominal muscles. Tip your pelvis up toward the ceiling and flatten your lower back into the floor.  To help with this exercise, you may place a small towel under your lower back and try to push your back into the towel. 3. Hold for __________ seconds. 4. Let your muscles relax completely before you repeat this exercise.  Repeat __________ times. Complete this exercise __________ times a day. Exercise D: Alternating arm and leg raises   1. Get on your hands and knees on a firm surface. If you are on a hard floor, you may want to use padding to cushion your knees, such as an exercise mat. 2. Line up your arms and legs. Your hands should be below your shoulders, and your knees should be below your hips. 3. Lift your left leg behind you. At the same time, raise your right arm and straighten it in front of you.  Do not lift  your leg higher than your hip.  Do not lift your arm higher than your shoulder.  Keep your abdominal and back muscles tight.  Keep your hips facing the ground.  Do not arch your back.  Keep your balance carefully, and do not hold your breath. 4. Hold for __________ seconds. 5. Slowly return to the starting position and repeat with your right leg and your left arm. Repeat __________ times. Complete this exercise __________times a day. Exercise J: Single leg lower with bent knees  1. Lie on your back on a firm surface. 2. Tense your abdominal muscles and lift your feet off the floor, one foot at a time, so your knees and hips are bent in an "L" shape (at about 90 degrees).  Your knees should be over your hips and your lower legs should be parallel to the floor. 3. Keeping your abdominal muscles tense and your knee bent, slowly lower one of your legs so your toe touches the ground. 4. Lift your leg back up to return to the starting position.  Do not hold your breath.  Do not let your back arch. Keep your back flat against the ground. 5. Repeat with your other leg. Repeat __________ times. Complete this exercise __________ times a day. Posture and body mechanics   Body mechanics refers to the movements and positions of your body while you do your daily activities. Posture is part of body mechanics. Good posture and healthy body mechanics can help to relieve stress in your body's tissues and joints. Good posture means that your spine is in its natural S-curve position (your spine is neutral), your shoulders are pulled back slightly, and your head is not tipped forward. The following are general guidelines for applying improved posture and body mechanics to your everyday activities. Standing    When standing, keep your spine neutral and your feet about hip-width apart. Keep a slight bend in your knees. Your ears, shoulders, and hips should line up.  When you do a task in which you stand  in one place for a long time, place one foot up on a stable object that is 2-4 inches (5-10 cm) high, such as a footstool. This helps keep your spine neutral. Sitting    When sitting, keep your spine neutral and keep your feet flat on the floor. Use a footrest, if necessary, and keep your thighs parallel to the floor. Avoid rounding your shoulders, and avoid tilting your head forward.  When working at a desk or a computer, keep your desk at a height where your hands are slightly lower than your elbows. Slide your chair under your desk so you are close enough to maintain good posture.  When working at a computer, place your monitor at a height where you are looking straight ahead and you do not have to tilt your head forward or downward to look at the screen. Resting  When lying down and resting, avoid positions that are most painful for you.  If you have pain with activities such as sitting, bending, stooping, or squatting (flexion-based activities), lie in a position in which your body does not bend very much. For example, avoid curling up on your side with your arms and knees near your chest (fetal position).  If you have pain with activities such as standing for a long time or reaching with your arms (extension-based activities), lie with your spine in a neutral position and bend your knees slightly. Try the following positions:  Lying on your side with a pillow between your knees.  Lying on your back with a pillow under your knees. Lifting    When lifting objects, keep your feet at least shoulder-width apart and tighten your abdominal muscles.  Bend your knees and hips and keep your spine neutral. It is important to lift using the strength of your legs, not your back. Do not lock your knees straight out.  Always ask for help to lift heavy or awkward objects. This information is not intended to replace advice given to you by your health care provider. Make sure you discuss any  questions you have with your health care provider. Document Released: 01/11/2005 Document Revised: 09/18/2015 Document Reviewed: 10/23/2014 Elsevier Interactive Patient Education  2017 [employer].
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
## 2016-04-14 ENCOUNTER — Ambulatory Visit (INDEPENDENT_AMBULATORY_CARE_PROVIDER_SITE_OTHER): Payer: Federal, State, Local not specified - PPO | Admitting: Family Medicine

## 2016-04-14 ENCOUNTER — Encounter: Payer: Self-pay | Admitting: Family Medicine

## 2016-04-14 VITALS — BP 110/78 | HR 95 | Ht 67.0 in | Wt 170.0 lb

## 2016-04-14 DIAGNOSIS — S134XXA Sprain of ligaments of cervical spine, initial encounter: Secondary | ICD-10-CM | POA: Insufficient documentation

## 2016-04-14 DIAGNOSIS — M999 Biomechanical lesion, unspecified: Secondary | ICD-10-CM

## 2016-04-14 DIAGNOSIS — M542 Cervicalgia: Secondary | ICD-10-CM | POA: Diagnosis not present

## 2016-04-14 MED ORDER — KETOROLAC TROMETHAMINE 60 MG/2ML IM SOLN
60.0000 mg | Freq: Once | INTRAMUSCULAR | Status: AC
  Administered 2016-04-14: 60 mg via INTRAMUSCULAR

## 2016-04-14 MED ORDER — METHYLPREDNISOLONE ACETATE 80 MG/ML IJ SUSP
80.0000 mg | Freq: Once | INTRAMUSCULAR | Status: AC
  Administered 2016-04-14: 80 mg via INTRAMUSCULAR

## 2016-04-14 MED ORDER — PREDNISONE 50 MG PO TABS: 50.0000 mg | ORAL_TABLET | Freq: Every day | ORAL | 0 refills | Status: DC
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
## 2016-04-14 NOTE — Assessment & Plan Note (Signed)
Decision today to treat with OMT was based on Physical Exam  After verbal consent patient was treated with  ME, FPR techniques in cervical, thoracic, rib lumbar and sacral areas she was unable to tolerate HVLA today.  Patient tolerated the procedure well with improvement in symptoms  Patient given exercises, stretches and lifestyle modifications  See medications in patient instructions if given  Patient will follow up in 2 weeks
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
## 2016-04-14 NOTE — Patient Instructions (Addendum)
Always good to see you  I am sorry you are hurting.  2 injections today  Continue the muscle relaxer if needed Will put you on light duty again for another 2 weeks.  Starting tomorrow stop the ibuprofen and start prednsione daily for 5 days.  See me again in 10-14 days.

## 2016-04-14 NOTE — Assessment & Plan Note (Signed)
>>  ASSESSMENT AND PLAN FOR NONALLOPATHIC LESION-RIB CAGE WRITTEN ON 04/14/2016  4:37 PM BY Judi Saa, DO  Decision today to treat with OMT was based on Physical Exam  After verbal consent patient was treated with  ME, FPR techniques in cervical, thoracic, rib lumbar and sacral areas she was unable to tolerate HVLA today.  Patient tolerated the procedure well with improvement in symptoms  Patient given exercises, stretches and lifestyle modifications  See medications in patient instructions if given  Patient will follow up in 2 weeks

## 2016-04-22 ENCOUNTER — Telehealth: Payer: Self-pay | Admitting: Endocrinology

## 2016-04-22 MED ORDER — METHIMAZOLE 5 MG PO TABS: ORAL_TABLET | ORAL | 2 refills | Status: DC

## 2016-04-22 NOTE — Telephone Encounter (Signed)
Refill of   methimazole (TAPAZOLE) 5 MG tablet   [facility] [address]

## 2016-04-22 NOTE — Telephone Encounter (Signed)
Refill submitted. 
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
## 2016-04-27 ENCOUNTER — Ambulatory Visit (INDEPENDENT_AMBULATORY_CARE_PROVIDER_SITE_OTHER): Payer: Federal, State, Local not specified - PPO | Admitting: Family Medicine

## 2016-04-27 ENCOUNTER — Encounter: Payer: Self-pay | Admitting: Family Medicine

## 2016-04-27 VITALS — BP 114/76 | HR 89 | Wt 168.0 lb

## 2016-04-27 DIAGNOSIS — S134XXA Sprain of ligaments of cervical spine, initial encounter: Secondary | ICD-10-CM

## 2016-04-27 DIAGNOSIS — M999 Biomechanical lesion, unspecified: Secondary | ICD-10-CM | POA: Diagnosis not present

## 2016-04-27 MED ORDER — KETOROLAC TROMETHAMINE 60 MG/2ML IM SOLN
60.0000 mg | Freq: Once | INTRAMUSCULAR | Status: AC
  Administered 2016-04-27: 60 mg via INTRAMUSCULAR

## 2016-04-27 MED ORDER — METHYLPREDNISOLONE ACETATE 80 MG/ML IJ SUSP
80.0000 mg | Freq: Once | INTRAMUSCULAR | Status: AC
  Administered 2016-04-27: 80 mg via INTRAMUSCULAR

## 2016-04-27 NOTE — Patient Instructions (Signed)
Good to see you  You are still tight, not out of the woods yet Ok to start regular duty on Friday  Start increasing activity slowly.  See me again in 4 weeks.

## 2016-04-27 NOTE — Assessment & Plan Note (Signed)
Still having increasing tightness noted. Patient even Toradol and Depo-Medrol again. We discussed continuing to increase activity. Patient will put on a three-week trial of full duty at work. Follow-up again in 3-4 weeks for further evaluation and should.

## 2016-04-27 NOTE — Assessment & Plan Note (Signed)
Decision today to treat with OMT was based on Physical Exam  After verbal consent patient was treated with HVLA, ME, FPR techniques in cervical, thoracic, lumbar and sacral areas  Patient tolerated the procedure well with improvement in symptoms  Patient given exercises, stretches and lifestyle modifications  See medications in patient instructions if given  Patient will follow up in 3-4 weeks  

## 2016-04-27 NOTE — Progress Notes (Signed)
Pre-visit discussion using our clinic review tool. No additional management support is needed unless otherwise documented below in the visit note.  

## 2016-04-27 NOTE — Assessment & Plan Note (Signed)
>>  ASSESSMENT AND PLAN FOR NONALLOPATHIC LESION-RIB CAGE WRITTEN ON 04/27/2016  3:04 PM BY Judi Saa, DO  Decision today to treat with OMT was based on Physical Exam  After verbal consent patient was treated with HVLA, ME, FPR techniques in cervical, thoracic, lumbar and sacral areas  Patient tolerated the procedure well with improvement in symptoms  Patient given exercises, stretches and lifestyle modifications  See medications in patient instructions if given  Patient will follow up in 3-4 weeks

## 2016-05-27 ENCOUNTER — Ambulatory Visit (INDEPENDENT_AMBULATORY_CARE_PROVIDER_SITE_OTHER): Payer: Federal, State, Local not specified - PPO | Admitting: Family Medicine

## 2016-05-27 ENCOUNTER — Encounter: Payer: Self-pay | Admitting: Family Medicine

## 2016-05-27 VITALS — BP 110/80 | HR 99 | Resp 16 | Wt 166.0 lb

## 2016-05-27 DIAGNOSIS — M533 Sacrococcygeal disorders, not elsewhere classified: Secondary | ICD-10-CM

## 2016-05-27 DIAGNOSIS — M999 Biomechanical lesion, unspecified: Secondary | ICD-10-CM | POA: Diagnosis not present

## 2016-05-27 DIAGNOSIS — S134XXA Sprain of ligaments of cervical spine, initial encounter: Secondary | ICD-10-CM

## 2016-05-27 NOTE — Progress Notes (Signed)
Pre-visit discussion using our clinic review tool. No additional management support is needed unless otherwise documented below in the visit note.  

## 2016-05-27 NOTE — Patient Instructions (Signed)
Good to see you  Christina [name] is your friend.  Exercises on wall.  Heel and butt touching.  Raise leg 6 inches and hold 2 seconds.  Down slow for count of 4 seconds.  1 set of 30 reps daily on both sides.  New SI exercises See me again in 4-6 weeks.

## 2016-05-27 NOTE — Assessment & Plan Note (Signed)
I still believe the patient is not quite back to her back a sling yet. I do think that another 4 weeks we'll be better. Patient though his back and full duty at work and I think she is doing relatively well. Therefore patient is back to her maximal medical improvement I would like to see her again in 4-6 weeks. Continues respond well to manipulation.
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
## 2016-05-27 NOTE — Assessment & Plan Note (Signed)
Decision today to treat with OMT was based on Physical Exam  After verbal consent patient was treated with HVLA, ME, FPR techniques in cervical, thoracic, lumbar and sacral areas  Patient tolerated the procedure well with improvement in symptoms  Patient given exercises, stretches and lifestyle modifications  See medications in patient instructions if given  Patient will follow up in 4 weeks 

## 2016-05-27 NOTE — Assessment & Plan Note (Signed)
Sacroiliac Joint Mobilization and Rehab 1. Work on pretzel stretching, shoulder back and leg draped in front. 3-5 sets, 30 sec.. 2. hip abductor rotations. standing, hip flexion and rotation outward then inward. 3 sets, 15 reps. when can do comfortably, add ankle weights starting at 2 pounds.  3. cross over stretching - shoulder back to ground, same side leg crossover. 3-5 sets for 30 min..  4. rolling up and back knees to chest and rocking. 5. sacral tilt - 5 sets, hold for 5-10 seconds RTC in 4 weeks.  

## 2016-06-17 ENCOUNTER — Encounter: Payer: Self-pay | Admitting: Family Medicine

## 2016-06-17 ENCOUNTER — Ambulatory Visit (INDEPENDENT_AMBULATORY_CARE_PROVIDER_SITE_OTHER): Payer: Federal, State, Local not specified - PPO | Admitting: Family Medicine

## 2016-06-17 ENCOUNTER — Encounter: Payer: Self-pay | Admitting: *Deleted

## 2016-06-17 VITALS — BP 112/82 | HR 83 | Ht 67.0 in | Wt 164.0 lb

## 2016-06-17 DIAGNOSIS — M533 Sacrococcygeal disorders, not elsewhere classified: Secondary | ICD-10-CM

## 2016-06-17 DIAGNOSIS — M999 Biomechanical lesion, unspecified: Secondary | ICD-10-CM

## 2016-06-17 NOTE — Patient Instructions (Signed)
Good to see you  Alvera Singh is your friend.  Iron 65mg  daily with 500mg  of vitamin C Try to come off the muscle relaxer You should do great  See me again in 3-4 weeks

## 2016-06-17 NOTE — Assessment & Plan Note (Signed)
Decision today to treat with OMT was based on Physical Exam  After verbal consent patient was treated with HVLA, ME, FPR techniques in cervical, thoracic, lumbar and sacral areas  Patient tolerated the procedure well with improvement in symptoms  Patient given exercises, stretches and lifestyle modifications  See medications in patient instructions if given  Patient will follow up in 3-4 weeks  
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
## 2016-06-17 NOTE — Assessment & Plan Note (Signed)
Still having more difficulty with the sacroiliac joint. Patient continues to have some spasming of the back. We discussed over-the-counter medications including iron supplementation that I think will be beneficial. Patient will start using this on a more regular basis. Patient will start to titrate off of the muscle relaxer. We discussed which activities to do in which ones to avoid. Patient given some limitation of 40 hour workweek and then will return again in 3 weeks

## 2016-07-01 ENCOUNTER — Ambulatory Visit: Payer: Federal, State, Local not specified - PPO | Admitting: Family

## 2016-07-08 ENCOUNTER — Other Ambulatory Visit: Payer: Self-pay | Admitting: Certified Nurse Midwife

## 2016-07-08 DIAGNOSIS — Z1231 Encounter for screening mammogram for malignant neoplasm of breast: Secondary | ICD-10-CM

## 2016-07-14 ENCOUNTER — Encounter: Payer: Self-pay | Admitting: Family Medicine

## 2016-07-14 ENCOUNTER — Ambulatory Visit (INDEPENDENT_AMBULATORY_CARE_PROVIDER_SITE_OTHER)
Admission: RE | Admit: 2016-07-14 | Discharge: 2016-07-14 | Disposition: A | Discharge status: Home / Self Care | Payer: Federal, State, Local not specified - PPO | Source: Ambulatory Visit | Attending: Family Medicine | Admitting: Family Medicine

## 2016-07-14 ENCOUNTER — Ambulatory Visit (INDEPENDENT_AMBULATORY_CARE_PROVIDER_SITE_OTHER): Payer: Federal, State, Local not specified - PPO | Admitting: Family Medicine

## 2016-07-14 VITALS — BP 120/82 | HR 66 | Ht 67.0 in | Wt 166.0 lb

## 2016-07-14 DIAGNOSIS — M545 Low back pain: Secondary | ICD-10-CM

## 2016-07-14 DIAGNOSIS — M999 Biomechanical lesion, unspecified: Secondary | ICD-10-CM
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
## 2016-07-14 NOTE — Patient Instructions (Addendum)
Good to see you  We will continue the restirictions.  We will get labs when greg does for the iron.  Stay active and continue the exercises Lets do get xrays just to make sure See me again in 4 weeks

## 2016-07-14 NOTE — Assessment & Plan Note (Signed)
>>  ASSESSMENT AND PLAN FOR LOW BACK PAIN WRITTEN ON 07/14/2016  2:53 PM BY Antoine Primas M, DO  Continued discomfort at this time. X-rays ordered today for further evaluation significant distal longevity of the pain. I believe that this is still musculoskeletal. Near patient's baseline. Still restrictive 40 hours weekly. Continue same medications. Follow-up again in 4 weeks

## 2016-07-14 NOTE — Assessment & Plan Note (Signed)
Decision today to treat with OMT was based on Physical Exam  After verbal consent patient was treated with HVLA, ME, FPR techniques in cervical, thoracic, rib, lumbar and sacral areas  Patient tolerated the procedure well with improvement in symptoms  Patient given exercises, stretches and lifestyle modifications  See medications in patient instructions if given  Patient will follow up in 4 weeks 

## 2016-07-14 NOTE — Progress Notes (Signed)
Dg l 

## 2016-07-14 NOTE — Assessment & Plan Note (Signed)
>>  ASSESSMENT AND PLAN FOR NONALLOPATHIC LESION-RIB CAGE WRITTEN ON 07/14/2016  2:53 PM BY Judi Saa, DO  Decision today to treat with OMT was based on Physical Exam  After verbal consent patient was treated with HVLA, ME, FPR techniques in cervical, thoracic, rib, lumbar and sacral areas  Patient tolerated the procedure well with improvement in symptoms  Patient given exercises, stretches and lifestyle modifications  See medications in patient instructions if given  Patient will follow up in 4 weeks

## 2016-07-14 NOTE — Assessment & Plan Note (Signed)
Continued discomfort at this time. X-rays ordered today for further evaluation significant distal longevity of the pain. I believe that this is still musculoskeletal. Near patient's baseline. Still restrictive 40 hours weekly. Continue same medications. Follow-up again in 4 weeks

## 2016-07-21 ENCOUNTER — Ambulatory Visit
Admission: RE | Admit: 2016-07-21 | Discharge: 2016-07-21 | Disposition: A | Discharge status: Home / Self Care | Payer: Federal, State, Local not specified - PPO | Source: Ambulatory Visit | Attending: Certified Nurse Midwife | Admitting: Certified Nurse Midwife

## 2016-07-21 DIAGNOSIS — Z1231 Encounter for screening mammogram for malignant neoplasm of breast: Secondary | ICD-10-CM

## 2016-07-29 ENCOUNTER — Ambulatory Visit (INDEPENDENT_AMBULATORY_CARE_PROVIDER_SITE_OTHER): Payer: Federal, State, Local not specified - PPO | Admitting: Certified Nurse Midwife

## 2016-07-29 ENCOUNTER — Other Ambulatory Visit (HOSPITAL_COMMUNITY)
Admission: RE | Admit: 2016-07-29 | Discharge: 2016-07-29 | Disposition: A | Discharge status: Home / Self Care | Payer: Federal, State, Local not specified - PPO | Source: Ambulatory Visit | Attending: Obstetrics & Gynecology | Admitting: Obstetrics & Gynecology

## 2016-07-29 ENCOUNTER — Encounter: Payer: Self-pay | Admitting: Certified Nurse Midwife

## 2016-07-29 VITALS — BP 112/68 | HR 70 | Resp 16 | Ht 66.75 in | Wt 164.0 lb

## 2016-07-29 DIAGNOSIS — Z124 Encounter for screening for malignant neoplasm of cervix: Secondary | ICD-10-CM | POA: Diagnosis not present

## 2016-07-29 DIAGNOSIS — Z800 Family history of malignant neoplasm of digestive organs: Secondary | ICD-10-CM

## 2016-07-29 DIAGNOSIS — Z01419 Encounter for gynecological examination (general) (routine) without abnormal findings: Secondary | ICD-10-CM

## 2016-07-29 DIAGNOSIS — Z803 Family history of malignant neoplasm of breast: Secondary | ICD-10-CM

## 2016-07-29 NOTE — Patient Instructions (Signed)
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
## 2016-07-30 LAB — CYTOLOGY - PAP: DIAGNOSIS: NEGATIVE

## 2016-08-11 ENCOUNTER — Encounter: Payer: Self-pay | Admitting: Family Medicine

## 2016-08-11 ENCOUNTER — Ambulatory Visit (INDEPENDENT_AMBULATORY_CARE_PROVIDER_SITE_OTHER): Payer: Federal, State, Local not specified - PPO | Admitting: Family Medicine

## 2016-08-11 VITALS — BP 116/78 | HR 83 | Resp 16 | Wt 170.0 lb

## 2016-08-11 DIAGNOSIS — M533 Sacrococcygeal disorders, not elsewhere classified: Secondary | ICD-10-CM | POA: Diagnosis not present

## 2016-08-11 DIAGNOSIS — M940 Chondrocostal junction syndrome [Tietze]: Secondary | ICD-10-CM | POA: Diagnosis not present

## 2016-08-11 DIAGNOSIS — M999 Biomechanical lesion, unspecified: Secondary | ICD-10-CM
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
## 2016-08-11 NOTE — Assessment & Plan Note (Addendum)
>>  ASSESSMENT AND PLAN FOR SLIPPED RIB SYNDROME WRITTEN ON 08/11/2016  3:32 PM BY Antoine Primas M, DO  Patient continues to have some difficulty but seems to be doing relatively well. Her to feel that the sacroiliac joint is more concerning. No radicular pain but pain is going now for 3 months. I do feel that if worsening symptoms I would like to consider possibly advance imaging. Patient feels like she is making some progress. Follow-up again 4 weeks if continuing have pain we will consider advanced imaging.  >>ASSESSMENT AND PLAN FOR NONALLOPATHIC LESION-RIB CAGE WRITTEN ON 08/11/2016  3:33 PM BY Judi Saa, DO  Decision today to treat with OMT was based on Physical Exam  After verbal consent patient was treated with HVLA, ME, FPR techniques in cervical, thoracic, rib lumbar and sacral areas  Patient tolerated the procedure well with improvement in symptoms  Patient given exercises, stretches and lifestyle modifications  See medications in patient instructions if given  Patient will follow up in 4-6 weeks

## 2016-08-11 NOTE — Assessment & Plan Note (Signed)
As stated above. Encourage her to continue to do the conservative therapy including core strength and hip abductor strengthening

## 2016-08-11 NOTE — Patient Instructions (Addendum)
Good to see you  Ice is your friend I think the new job will be great  Keep it up  See me again in 4 weeks and hopefully we can close this

## 2016-08-11 NOTE — Assessment & Plan Note (Signed)
Decision today to treat with OMT was based on Physical Exam  After verbal consent patient was treated with HVLA, ME, FPR techniques in cervical, thoracic, rib lumbar and sacral areas  Patient tolerated the procedure well with improvement in symptoms  Patient given exercises, stretches and lifestyle modifications  See medications in patient instructions if given  Patient will follow up in 4-6 weeks 

## 2016-08-20 ENCOUNTER — Encounter: Payer: Self-pay | Admitting: Family

## 2016-08-20 ENCOUNTER — Ambulatory Visit (INDEPENDENT_AMBULATORY_CARE_PROVIDER_SITE_OTHER): Payer: Federal, State, Local not specified - PPO | Admitting: Family

## 2016-08-20 VITALS — BP 118/86 | HR 75 | Temp 98.8°F | Resp 16 | Ht 67.0 in | Wt 167.0 lb

## 2016-08-20 DIAGNOSIS — Z0000 Encounter for general adult medical examination without abnormal findings: Secondary | ICD-10-CM

## 2016-08-20 DIAGNOSIS — G8929 Other chronic pain: Secondary | ICD-10-CM

## 2016-08-20 DIAGNOSIS — M5442 Lumbago with sciatica, left side: Secondary | ICD-10-CM | POA: Diagnosis not present

## 2016-08-20 MED ORDER — GABAPENTIN 100 MG PO CAPS: 100.0000 mg | ORAL_CAPSULE | Freq: Three times a day (TID) | ORAL | 0 refills | Status: DC

## 2016-08-20 MED ORDER — GABAPENTIN 100 MG PO CAPS: 100.0000 mg | ORAL_CAPSULE | Freq: Every day | ORAL | 0 refills | Status: DC

## 2016-08-20 NOTE — Assessment & Plan Note (Signed)
>>  ASSESSMENT AND PLAN FOR LOW BACK PAIN WRITTEN ON 08/20/2016  5:01 PM BY CALONE, GREGORY D, FNP  Continues to experience radiculopathy with back pain. Start gabapentin as needed for neuropathic pain. Recommend continued follow up with Sports Medicine as scheduled.

## 2016-08-20 NOTE — Assessment & Plan Note (Signed)
Continues to experience radiculopathy with back pain. Start gabapentin as needed for neuropathic pain. Recommend continued follow up with Sports Medicine as scheduled.

## 2016-08-20 NOTE — Patient Instructions (Addendum)
Thank you for choosing [facility].  SUMMARY AND INSTRUCTIONS:  Medication:  Your prescription(s) have been submitted to your pharmacy or been printed and provided for you. Please take as directed and contact our office if you believe you are having problem(s) with the medication(s) or have any questions.  Labs:  Please stop by the lab on the lower level of the building for your blood work. Your results will be released to [place] (or called to you) after review, usually within 72 hours after test completion. If any changes need to be made, you will be notified at that same time.  1.) The lab is open from 7:30am to 5:30 pm Monday-Friday 2.) No appointment is necessary 3.) Fasting (if needed) is 6-8 hours after food and drink; black coffee and water are okay   Follow up:  If your symptoms worsen or fail to improve, please contact our office for further instruction, or in case of emergency go directly to the emergency room at the closest medical facility.    Health Maintenance, Female Adopting a healthy lifestyle and getting preventive care can go a long way to promote health and wellness. Talk with your health care provider about what schedule of regular examinations is right for you. This is a good chance for you to check in with your provider about disease prevention and staying healthy. In between checkups, there are plenty of things you can do on your own. Experts have done a lot of research about which lifestyle changes and preventive measures are most likely to keep you healthy. Ask your health care provider for more information. Weight and diet Eat a healthy diet  Be sure to include plenty of vegetables, fruits, low-fat dairy products, and lean protein.  Do not eat a lot of foods high in solid fats, added sugars, or salt.  Get regular exercise. This is one of the most important things you can do for your health. ? Most adults should exercise for at least 150 minutes each  week. The exercise should increase your heart rate and make you sweat (moderate-intensity exercise). ? Most adults should also do strengthening exercises at least twice a week. This is in addition to the moderate-intensity exercise.  Maintain a healthy weight  Body mass index (BMI) is a measurement that can be used to identify possible weight problems. It estimates body fat based on height and weight. Your health care provider can help determine your BMI and help you achieve or maintain a healthy weight.  For females 72 years of age and older: ? A BMI below 18.5 is considered underweight. ? A BMI of 18.5 to 24.9 is normal. ? A BMI of 25 to 29.9 is considered overweight. ? A BMI of 30 and above is considered obese.  Watch levels of cholesterol and blood lipids  You should start having your blood tested for lipids and cholesterol at 47 years of age, then have this test every 5 years.  You may need to have your cholesterol levels checked more often if: ? Your lipid or cholesterol levels are high. ? You are older than 47 years of age. ? You are at high risk for heart disease.  Cancer screening Lung Cancer  Lung cancer screening is recommended for adults 15-58 years old who are at high risk for lung cancer because of a history of smoking.  A yearly low-dose CT scan of the lungs is recommended for people who: ? Currently smoke. ? Have quit within the past 15 years. ?  Have at least a 30-pack-year history of smoking. A pack year is smoking an average of one pack of cigarettes a day for 1 year.  Yearly screening should continue until it has been 15 years since you quit.  Yearly screening should stop if you develop a health problem that would prevent you from having lung cancer treatment.  Breast Cancer  Practice breast self-awareness. This means understanding how your breasts normally appear and feel.  It also means doing regular breast self-exams. Let your health care provider know  about any changes, no matter how small.  If you are in your 20s or 30s, you should have a clinical breast exam (CBE) by a health care provider every 1-3 years as part of a regular health exam.  If you are 62 or older, have a CBE every year. Also consider having a breast X-ray (mammogram) every year.  If you have a family history of breast cancer, talk to your health care provider about genetic screening.  If you are at high risk for breast cancer, talk to your health care provider about having an MRI and a mammogram every year.  Breast cancer gene (BRCA) assessment is recommended for women who have family members with BRCA-related cancers. BRCA-related cancers include: ? Breast. ? Ovarian. ? Tubal. ? Peritoneal cancers.  Results of the assessment will determine the need for genetic counseling and BRCA1 and BRCA2 testing.  Cervical Cancer Your health care provider may recommend that you be screened regularly for cancer of the pelvic organs (ovaries, uterus, and vagina). This screening involves a pelvic examination, including checking for microscopic changes to the surface of your cervix (Pap test). You may be encouraged to have this screening done every 3 years, beginning at age 78.  For women ages 96-65, health care providers may recommend pelvic exams and Pap testing every 3 years, or they may recommend the Pap and pelvic exam, combined with testing for human papilloma virus (HPV), every 5 years. Some types of HPV increase your risk of cervical cancer. Testing for HPV may also be done on women of any age with unclear Pap test results.  Other health care providers may not recommend any screening for nonpregnant women who are considered low risk for pelvic cancer and who do not have symptoms. Ask your health care provider if a screening pelvic exam is right for you.  If you have had past treatment for cervical cancer or a condition that could lead to cancer, you need Pap tests and screening  for cancer for at least 20 years after your treatment. If Pap tests have been discontinued, your risk factors (such as having a new sexual partner) need to be reassessed to determine if screening should resume. Some women have medical problems that increase the chance of getting cervical cancer. In these cases, your health care provider may recommend more frequent screening and Pap tests.  Colorectal Cancer  This type of cancer can be detected and often prevented.  Routine colorectal cancer screening usually begins at 47 years of age and continues through 47 years of age.  Your health care provider may recommend screening at an earlier age if you have risk factors for colon cancer.  Your health care provider may also recommend using home test kits to check for hidden blood in the stool.  A small camera at the end of a tube can be used to examine your colon directly (sigmoidoscopy or colonoscopy). This is done to check for the earliest forms of colorectal  cancer.  Routine screening usually begins at age 17.  Direct examination of the colon should be repeated every 5-10 years through 47 years of age. However, you may need to be screened more often if early forms of precancerous polyps or small growths are found.  Skin Cancer  Check your skin from head to toe regularly.  Tell your health care provider about any new moles or changes in moles, especially if there is a change in a mole's shape or color.  Also tell your health care provider if you have a mole that is larger than the size of a pencil eraser.  Always use sunscreen. Apply sunscreen liberally and repeatedly throughout the day.  Protect yourself by wearing long sleeves, pants, a wide-brimmed hat, and sunglasses whenever you are outside.  Heart disease, diabetes, and high blood pressure  High blood pressure causes heart disease and increases the risk of stroke. High blood pressure is more likely to develop in: ? People who have  blood pressure in the high end of the normal range (130-139/85-89 mm Hg). ? People who are overweight or obese. ? People who are African American.  If you are 15-85 years of age, have your blood pressure checked every 3-5 years. If you are 40 years of age or older, have your blood pressure checked every year. You should have your blood pressure measured twice-once when you are at a hospital or clinic, and once when you are not at a hospital or clinic. Record the average of the two measurements. To check your blood pressure when you are not at a hospital or clinic, you can use: ? An automated blood pressure machine at a pharmacy. ? A home blood pressure monitor.  If you are between 27 years and 62 years old, ask your health care provider if you should take aspirin to prevent strokes.  Have regular diabetes screenings. This involves taking a blood sample to check your fasting blood sugar level. ? If you are at a normal weight and have a low risk for diabetes, have this test once every three years after 47 years of age. ? If you are overweight and have a high risk for diabetes, consider being tested at a younger age or more often. Preventing infection Hepatitis B  If you have a higher risk for hepatitis B, you should be screened for this virus. You are considered at high risk for hepatitis B if: ? You were born in a country where hepatitis B is common. Ask your health care provider which countries are considered high risk. ? Your parents were born in a high-risk country, and you have not been immunized against hepatitis B (hepatitis B vaccine). ? You have HIV or AIDS. ? You use needles to inject street drugs. ? You live with someone who has hepatitis B. ? You have had sex with someone who has hepatitis B. ? You get hemodialysis treatment. ? You take certain medicines for conditions, including cancer, organ transplantation, and autoimmune conditions.  Hepatitis C  Blood testing is recommended  for: ? Everyone born from [birth year] through 1966. ? Anyone with known risk factors for hepatitis C.  Sexually transmitted infections (STIs)  You should be screened for sexually transmitted infections (STIs) including gonorrhea and chlamydia if: ? You are sexually active and are younger than 47 years of age. ? You are older than 47 years of age and your health care provider tells you that you are at risk for this type of infection. ? Your  sexual activity has changed since you were last screened and you are at an increased risk for chlamydia or gonorrhea. Ask your health care provider if you are at risk.  If you do not have HIV, but are at risk, it may be recommended that you take a prescription medicine daily to prevent HIV infection. This is called pre-exposure prophylaxis (PrEP). You are considered at risk if: ? You are sexually active and do not regularly use condoms or know the HIV status of your partner(s). ? You take drugs by injection. ? You are sexually active with a partner who has HIV.  Talk with your health care provider about whether you are at high risk of being infected with HIV. If you choose to begin PrEP, you should first be tested for HIV. You should then be tested every 3 months for as long as you are taking PrEP. Pregnancy  If you are premenopausal and you may become pregnant, ask your health care provider about preconception counseling.  If you may become pregnant, take 400 to 800 micrograms (mcg) of folic acid every day.  If you want to prevent pregnancy, talk to your health care provider about birth control (contraception). Osteoporosis and menopause  Osteoporosis is a disease in which the bones lose minerals and strength with aging. This can result in serious bone fractures. Your risk for osteoporosis can be identified using a bone density scan.  If you are 76 years of age or older, or if you are at risk for osteoporosis and fractures, ask your health care provider if  you should be screened.  Ask your health care provider whether you should take a calcium or vitamin D supplement to lower your risk for osteoporosis.  Menopause may have certain physical symptoms and risks.  Hormone replacement therapy may reduce some of these symptoms and risks. Talk to your health care provider about whether hormone replacement therapy is right for you. Follow these instructions at home:  Schedule regular health, dental, and eye exams.  Stay current with your immunizations.  Do not use any tobacco products including cigarettes, chewing tobacco, or electronic cigarettes.  If you are pregnant, do not drink alcohol.  If you are breastfeeding, limit how much and how often you drink alcohol.  Limit alcohol intake to no more than 1 drink per day for nonpregnant women. One drink equals 12 ounces of beer, 5 ounces of wine, or 1 ounces of hard liquor.  Do not use street drugs.  Do not share needles.  Ask your health care provider for help if you need support or information about quitting drugs.  Tell your health care provider if you often feel depressed.  Tell your health care provider if you have ever been abused or do not feel safe at home. This information is not intended to replace advice given to you by your health care provider. Make sure you discuss any questions you have with your health care provider. Document Released: 07/27/2010 Document Revised: 06/19/2015 Document Reviewed: 10/15/2014 Elsevier Interactive Patient Education  2018 [employer].   Food Choices for Gastroesophageal Reflux Disease, Adult When you have gastroesophageal reflux disease (GERD), the foods you eat and your eating habits are very important. Choosing the right foods can help ease your discomfort. What guidelines do I need to follow?  Choose fruits, vegetables, whole grains, and low-fat dairy products.  Choose low-fat meat, fish, and poultry.  Limit fats such as oils, salad  dressings, butter, nuts, and avocado.  Keep a food  diary. This helps you identify foods that cause symptoms.  Avoid foods that cause symptoms. These may be different for everyone.  Eat small meals often instead of 3 large meals a day.  Eat your meals slowly, in a place where you are relaxed.  Limit fried foods.  Cook foods using methods other than frying.  Avoid drinking alcohol.  Avoid drinking large amounts of liquids with your meals.  Avoid bending over or lying down until 2-3 hours after eating. What foods are not recommended? These are some foods and drinks that may make your symptoms worse: Vegetables Tomatoes. Tomato juice. Tomato and spaghetti sauce. Chili peppers. Onion and garlic. Horseradish. Fruits Oranges, grapefruit, and lemon (fruit and juice). Meats High-fat meats, fish, and poultry. This includes hot dogs, ribs, ham, sausage, salami, and bacon. Dairy Whole milk and chocolate milk. Sour cream. Cream. Butter. Ice cream. Cream cheese. Drinks Coffee and tea. Bubbly (carbonated) drinks or energy drinks. Condiments Hot sauce. Barbecue sauce. Sweets/Desserts Chocolate and cocoa. Donuts. Peppermint and spearmint. Fats and Oils High-fat foods. This includes [place] fries and potato chips. Other Vinegar. Strong spices. This includes black pepper, white pepper, red pepper, cayenne, curry powder, cloves, ginger, and chili powder. The items listed above may not be a complete list of foods and drinks to avoid. Contact your dietitian for more information. This information is not intended to replace advice given to you by your health care provider. Make sure you discuss any questions you have with your health care provider. Document Released: 07/13/2011 Document Revised: 06/19/2015 Document Reviewed: 11/15/2012 Elsevier Interactive Patient Education  2017 [employer].

## 2016-08-20 NOTE — Assessment & Plan Note (Signed)
1) Anticipatory Guidance: Discussed importance of wearing a seatbelt while driving and not texting while driving; changing batteries in smoke detector at least once annually; wearing suntan lotion when outside; eating a balanced and moderate diet; getting physical activity at least 30 minutes per day.  2) Immunizations / Screenings / Labs:  All immunizations are up-to-date per recommendations. Cervical cancer screening is up-to-date. Due for a dental exam encouraged to be completed independently. All other screenings are up-to-date per recommendations. Obtain CBC, CMET, and lipid profile.    Overall well exam with minimal risk factors for cardiovascular disease. She is of good body weight. Recommend increasing physical activity of 30 minutes of moderate level activity daily or approximately 10,000 steps per day. Continue other healthy lifestyle behaviors and choices. Follow-up prevention exam in 1 year. Follow-up office visit pending blood work and for chronic conditions.

## 2016-08-20 NOTE — Progress Notes (Signed)
Subjective:    Patient ID: Christina [name]. Christina [name], female    DOB: Oct 23, 1970, 46 y.o.   MRN: [id number]  Chief Complaint  Patient presents with  . CPE    not fasting     HPI:  Christina [name] is a 46 y.o. female who presents today for an annual wellness visit.   1) Health Maintenance -   Diet - Averages about 3 meals per day consisting of a regular diet. Working on reducing her carbohydrate; Caffeine intake is about 1-2 cups per day  Exercise -  Working on improving with no structured exercise.   2) Preventative Exams / Immunizations:  Dental -- Due for exam   Vision -- Up to date    Health Maintenance  Topic Date Due  . HIV Screening  07/10/1984  . INFLUENZA VACCINE  08/25/2016  . PAP SMEAR  07/30/2019  . TETANUS/TDAP  05/26/2024    Immunization History  Administered Date(s) Administered  . Tdap 05/27/2014     Allergies  Allergen Reactions  . Topamax [Topiramate]     Pain in legs, SOB, and almost passed out     Outpatient Medications Prior to Visit  Medication Sig Dispense Refill  . aspirin 81 MG tablet Take 81 mg by mouth daily.    Garlan Fillers Peroxide (EAR DROPS OT) Place in ear(s) as needed.     . cholecalciferol (VITAMIN D) 1000 units tablet Take 1,000 Units by mouth daily.    . Ibuprofen-Famotidine 800-26.6 MG TABS Take 1 tablet by mouth 3 (three) times daily as needed. 90 tablet 1  . Iron TABS Take by mouth.    . methimazole (TAPAZOLE) 5 MG tablet On M W F 25 tablet 2  . Pyridoxine HCl (B-6) 100 MG TABS Take by mouth.    . vitamin B-12 (CYANOCOBALAMIN) 1000 MCG tablet Take 1,000 mcg by mouth daily.    . vitamin C (ASCORBIC ACID) 500 MG tablet Take 500 mg by mouth daily.     No facility-administered medications prior to visit.      Past Medical History:  Diagnosis Date  . Hypercholesteremia   . Hyperthyroidism   . Migraines    with asthma  . Slipping rib syndrome      Past Surgical History:  Procedure Laterality Date  . BREAST BIOPSY Right  06-26-14   benign per patient     Family History  Problem Relation Age of Onset  . Diabetes Mother   . Hypertension Mother   . Colon cancer Mother   . Breast cancer Mother   . Prostate cancer Father      Social History   Social History  . Marital status: Married    Spouse name: N/A  . Number of children: 1  . Years of education: 72   Occupational History  . Mail Processor    Social History Main Topics  . Smoking status: Never Smoker  . Smokeless tobacco: Never Used  . Alcohol use 0.0 oz/week     Comment: On occasion   . Drug use: No  . Sexual activity: Yes    Partners: Male    Birth control/ protection: Condom   Other Topics Concern  . Not on file   Social History Narrative   Born and raised in [place]. Currently resides in a house with her child. No pets. Fun: Go to the movies.    Denies religious beliefs effecting health care.     Review of Systems  Constitutional: Denies fever, chills, fatigue,  or significant weight gain/loss. HENT: Head: Denies headache or neck pain Ears: Denies changes in hearing, ringing in ears, earache, drainage Nose: Denies discharge, stuffiness, itching, nosebleed, sinus pain Throat: Denies sore throat, hoarseness, dry mouth, sores, thrush Eyes: Denies loss/changes in vision, pain, redness, blurry/double vision, flashing lights Cardiovascular: Denies chest pain/discomfort, tightness, palpitations, shortness of breath with activity, difficulty lying down, swelling, sudden awakening with shortness of breath Respiratory: Denies shortness of breath, cough, sputum production, wheezing Gastrointestinal: Denies dysphasia, heartburn, change in appetite, nausea, change in bowel habits, rectal bleeding, constipation, diarrhea, yellow skin or eyes Genitourinary: Denies frequency, urgency, burning/pain, blood in urine, incontinence, change in urinary strength. Musculoskeletal: Denies muscle/joint pain, stiffness, back pain, redness or swelling of  joints, trauma Skin: Denies rashes, lumps, itching, dryness, color changes, or hair/nail changes Neurological: Denies dizziness, fainting, seizures, weakness, numbness, tingling, tremor Psychiatric - Denies nervousness, stress, depression or memory loss Endocrine: Denies heat or cold intolerance, sweating, frequent urination, excessive thirst, changes in appetite Hematologic: Denies ease of bruising or bleeding     Objective:    BP 118/86 (BP Location: Left Arm, Patient Position: Sitting, Cuff Size: Large)   Pulse 75   Temp 98.8 F (37.1 C) (Oral)   Resp 16   Ht 5\' 7"  (1.702 m)   Wt 167 lb (75.8 kg)   SpO2 96%   BMI 26.16 kg/m  Nursing note and vital signs reviewed.  Physical Exam  Constitutional: She is oriented to person, place, and time. She appears well-developed and well-nourished.  HENT:  Head: Normocephalic.  Right Ear: Hearing, tympanic membrane, external ear and ear canal normal.  Left Ear: Hearing, tympanic membrane, external ear and ear canal normal.  Nose: Nose normal.  Mouth/Throat: Uvula is midline, oropharynx is clear and moist and mucous membranes are normal.  Eyes: Pupils are equal, round, and reactive to light. Conjunctivae and EOM are normal.  Neck: Neck supple. No JVD present. No tracheal deviation present. No thyromegaly present.  Cardiovascular: Normal rate, regular rhythm, normal heart sounds and intact distal pulses.   Pulmonary/Chest: Effort normal and breath sounds normal.  Abdominal: Soft. Bowel sounds are normal. She exhibits no distension and no mass. There is no tenderness. There is no rebound and no guarding.  Musculoskeletal: Normal range of motion. She exhibits no edema or tenderness.  Lymphadenopathy:    She has no cervical adenopathy.  Neurological: She is alert and oriented to person, place, and time. She has normal reflexes. No cranial nerve deficit. She exhibits normal muscle tone. Coordination normal.  Skin: Skin is warm and dry.    Psychiatric: She has a normal mood and affect. Her behavior is normal. Judgment and thought content normal.       Assessment & Plan:   Problem List Items Addressed This Visit      Other   Routine general medical examination at a health care facility - Primary    1) Anticipatory Guidance: Discussed importance of wearing a seatbelt while driving and not texting while driving; changing batteries in smoke detector at least once annually; wearing suntan lotion when outside; eating a balanced and moderate diet; getting physical activity at least 30 minutes per day.  2) Immunizations / Screenings / Labs:  All immunizations are up-to-date per recommendations. Cervical cancer screening is up-to-date. Due for a dental exam encouraged to be completed independently. All other screenings are up-to-date per recommendations. Obtain CBC, CMET, and lipid profile.    Overall well exam with minimal risk factors for cardiovascular disease. She  is of good body weight. Recommend increasing physical activity of 30 minutes of moderate level activity daily or approximately 10,000 steps per day. Continue other healthy lifestyle behaviors and choices. Follow-up prevention exam in 1 year. Follow-up office visit pending blood work and for chronic conditions.      Relevant Orders   Comprehensive metabolic panel   CBC   Lipid panel   Low back pain    Continues to experience radiculopathy with back pain. Start gabapentin as needed for neuropathic pain. Recommend continued follow up with Sports Medicine as scheduled.           I have changed Ms. Smith's gabapentin. I am also having her maintain her Carbamide Peroxide (EAR DROPS OT), aspirin, vitamin B-12, Iron, B-6, cholecalciferol, vitamin C, Ibuprofen-Famotidine, and methimazole.   Meds ordered this encounter  Medications  . DISCONTD: gabapentin (NEURONTIN) 100 MG capsule    Sig: Take 1 capsule (100 mg total) by mouth 3 (three) times daily.    Dispense:  30  capsule    Refill:  0    Order Specific Question:   Supervising Provider    Answer:   Pricilla Holm A [[id number]]  . gabapentin (NEURONTIN) 100 MG capsule    Sig: Take 1 capsule (100 mg total) by mouth at bedtime.    Dispense:  30 capsule    Refill:  0    Order Specific Question:   Supervising Provider    Answer:   Pricilla Holm A [[id number]]     Follow-up: Return in about 1 year (around 08/20/2017), or if symptoms worsen or fail to improve.   Mauricio Po, FNP

## 2016-08-24 ENCOUNTER — Other Ambulatory Visit (INDEPENDENT_AMBULATORY_CARE_PROVIDER_SITE_OTHER): Payer: Federal, State, Local not specified - PPO

## 2016-08-24 DIAGNOSIS — Z0000 Encounter for general adult medical examination without abnormal findings: Secondary | ICD-10-CM | POA: Diagnosis not present

## 2016-08-24 LAB — LIPID PANEL
CHOL/HDL RATIO: 5
CHOLESTEROL: 138 mg/dL (ref 0–200)
HDL: 28 mg/dL — ABNORMAL LOW (ref >39.00)
LDL CALC: 100 mg/dL — AB (ref 0–99)
NonHDL: 110.12
Triglycerides: 53 mg/dL (ref 0.0–149.0)
VLDL: 10.6 mg/dL (ref 0.0–40.0)

## 2016-08-24 LAB — COMPREHENSIVE METABOLIC PANEL
ALBUMIN: 4.2 g/dL (ref 3.5–5.2)
ALK PHOS: 38 U/L — AB (ref 39–117)
ALT: 10 U/L (ref 0–35)
AST: 14 U/L (ref 0–37)
BILIRUBIN TOTAL: 0.6 mg/dL (ref 0.2–1.2)
BUN: 13 mg/dL (ref 6–23)
CALCIUM: 9 mg/dL (ref 8.4–10.5)
CO2: 25 mEq/L (ref 19–32)
Chloride: 106 mEq/L (ref 96–112)
Creatinine, Ser: 1.06 mg/dL (ref 0.40–1.20)
GFR: 71.42 mL/min (ref >60.00)
GLUCOSE: 103 mg/dL — AB (ref 70–99)
POTASSIUM: 3.8 meq/L (ref 3.5–5.1)
Sodium: 138 mEq/L (ref 135–145)
TOTAL PROTEIN: 7.1 g/dL (ref 6.0–8.3)

## 2016-08-24 LAB — CBC
HCT: 43.5 % (ref 36.0–46.0)
HEMOGLOBIN: 14.3 g/dL (ref 12.0–15.0)
MCHC: 32.9 g/dL (ref 30.0–36.0)
MCV: 87.2 fl (ref 78.0–100.0)
PLATELETS: 233 10*3/uL (ref 150.0–400.0)
RBC: 4.99 Mil/uL (ref 3.87–5.11)
RDW: 12.9 % (ref 11.5–15.5)
WBC: 5.8 10*3/uL (ref 4.0–10.5)

## 2016-08-25 ENCOUNTER — Encounter: Payer: Self-pay | Admitting: Family

## 2016-09-07 ENCOUNTER — Encounter: Payer: Self-pay | Admitting: Family Medicine

## 2016-09-07 ENCOUNTER — Encounter: Payer: Self-pay | Admitting: Family

## 2016-09-07 ENCOUNTER — Ambulatory Visit (INDEPENDENT_AMBULATORY_CARE_PROVIDER_SITE_OTHER): Payer: Federal, State, Local not specified - PPO | Admitting: Family Medicine

## 2016-09-07 VITALS — BP 120/78 | HR 83 | Ht 67.0 in | Wt 165.0 lb

## 2016-09-07 DIAGNOSIS — M5416 Radiculopathy, lumbar region: Secondary | ICD-10-CM | POA: Diagnosis not present

## 2016-09-07 DIAGNOSIS — M999 Biomechanical lesion, unspecified: Secondary | ICD-10-CM

## 2016-09-07 NOTE — Assessment & Plan Note (Signed)
>>  ASSESSMENT AND PLAN FOR NONALLOPATHIC LESION-RIB CAGE WRITTEN ON 09/07/2016  2:24 PM BY Judi Saa, DO  Decision today to treat with OMT was based on Physical Exam  After verbal consent patient was treated with HVLA, ME, FPR techniques in cervical, thoracic rib areas  Patient tolerated the procedure well with improvement in symptoms  Patient given exercises, stretches and lifestyle modifications  See medications in patient instructions if given  Patient will follow up in 4 weeks

## 2016-09-07 NOTE — Patient Instructions (Signed)
Good to see you  We will get MRI  I do think gabapentin 200-300mg  at night is a good idea.  Stay active Depending on MRI we will then consider epidurals.  IF we do the epidural then we will see you again 2-3 weeks after.

## 2016-09-07 NOTE — Assessment & Plan Note (Signed)
Patient is having worsening symptoms overall. Patient has had this pain now going on 5 months. Not making significant improvement at this time. Has failed all conservative therapy. Patient will be sent for an MRI for further evaluation. Patient would be a candidate for a epidural injection. Patient is also has some weakness noted today and worsening symptoms overall.
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
## 2016-09-07 NOTE — Assessment & Plan Note (Signed)
Decision today to treat with OMT was based on Physical Exam  After verbal consent patient was treated with HVLA, ME, FPR techniques in cervical, thoracic rib areas  Patient tolerated the procedure well with improvement in symptoms  Patient given exercises, stretches and lifestyle modifications  See medications in patient instructions if given  Patient will follow up in 4 weeks

## 2016-09-14 NOTE — Telephone Encounter (Signed)
Patient is calling to follow up on this. Can you please advise. Thank you.

## 2016-09-17 ENCOUNTER — Emergency Department (HOSPITAL_COMMUNITY): Payer: Federal, State, Local not specified - PPO

## 2016-09-17 ENCOUNTER — Emergency Department (HOSPITAL_COMMUNITY)
Admission: EM | Admit: 2016-09-17 | Discharge: 2016-09-17 | Disposition: A | Discharge status: Home / Self Care | Payer: Federal, State, Local not specified - PPO | Attending: Emergency Medicine | Admitting: Emergency Medicine

## 2016-09-17 ENCOUNTER — Encounter (HOSPITAL_COMMUNITY): Payer: Self-pay | Admitting: *Deleted

## 2016-09-17 DIAGNOSIS — R05 Cough: Secondary | ICD-10-CM | POA: Diagnosis not present

## 2016-09-17 DIAGNOSIS — Z7982 Long term (current) use of aspirin: Secondary | ICD-10-CM | POA: Insufficient documentation

## 2016-09-17 DIAGNOSIS — J45909 Unspecified asthma, uncomplicated: Secondary | ICD-10-CM | POA: Diagnosis not present

## 2016-09-17 DIAGNOSIS — Z79899 Other long term (current) drug therapy: Secondary | ICD-10-CM | POA: Insufficient documentation

## 2016-09-17 DIAGNOSIS — R059 Cough, unspecified: Secondary | ICD-10-CM

## 2016-09-17 MED ORDER — BENZONATATE 100 MG PO CAPS: 100.0000 mg | ORAL_CAPSULE | Freq: Three times a day (TID) | ORAL | 0 refills | Status: DC | PRN

## 2016-09-17 NOTE — ED Triage Notes (Addendum)
Pt reports non-productive cough which normally happens when she is eating.  States 2 days ago, she started to cough even when she is not eating.  States she can feel the phlegm in her throat but it won't come up.  Has used OTC meds without relief.  Went to see her PCP 4 weeks ago for her cough and was put on prilosec.  Pt is in NAD.  Denies any fevers or chills.  Pt's lung sounds clear bilaterally.
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
## 2016-09-17 NOTE — Discharge Instructions (Signed)
Please read instructions below. You can take Tessalon every 8 hours as needed for cough. Continue taking Prilosec as prescribed by your PCP. Schedule an appointment with your primary care provider to follow up on your visit today, and for gastroenterology referral. Return to the ER for shortness breath, chest pain, fever, or new or concerning symptoms.

## 2016-09-17 NOTE — ED Notes (Signed)
Bed: WTR7 Expected date:  Expected time:  Means of arrival:  Comments: 

## 2016-09-17 NOTE — ED Notes (Signed)
Pt assessed and discharged by [place] EDPA

## 2016-09-22 ENCOUNTER — Telehealth: Payer: Self-pay | Admitting: Physician Assistant

## 2016-09-22 ENCOUNTER — Ambulatory Visit (INDEPENDENT_AMBULATORY_CARE_PROVIDER_SITE_OTHER): Payer: Federal, State, Local not specified - PPO | Admitting: Physician Assistant

## 2016-09-22 ENCOUNTER — Encounter: Payer: Self-pay | Admitting: Physician Assistant

## 2016-09-22 VITALS — BP 110/72 | HR 80 | Ht 67.0 in | Wt 162.0 lb

## 2016-09-22 DIAGNOSIS — R1013 Epigastric pain: Secondary | ICD-10-CM | POA: Diagnosis not present

## 2016-09-22 DIAGNOSIS — R05 Cough: Secondary | ICD-10-CM | POA: Diagnosis not present

## 2016-09-22 DIAGNOSIS — K219 Gastro-esophageal reflux disease without esophagitis: Secondary | ICD-10-CM | POA: Diagnosis not present

## 2016-09-22 DIAGNOSIS — R059 Cough, unspecified: Secondary | ICD-10-CM

## 2016-09-22 DIAGNOSIS — R1314 Dysphagia, pharyngoesophageal phase: Secondary | ICD-10-CM | POA: Diagnosis not present

## 2016-09-22 MED ORDER — OMEPRAZOLE 40 MG PO CPDR: 40.0000 mg | DELAYED_RELEASE_CAPSULE | Freq: Every day | ORAL | 3 refills | Status: DC

## 2016-09-22 NOTE — Progress Notes (Signed)
Assessment and plans reviewed  

## 2016-09-22 NOTE — Patient Instructions (Addendum)
You have been scheduled for an endoscopy. Please follow written instructions given to you at your visit today. If you use inhalers (even only as needed), please bring them with you on the day of your procedure. Your physician has requested that you go to www.startemmi.com and enter the access code given to you at your visit today. This web site gives a general overview about your procedure. However, you should still follow specific instructions given to you by our office regarding your preparation for the procedure.  We have sent the following medications to your pharmacy for you to pick up at your convenience: Omeprazole 40 mg daily 30-60 mins before breakfast

## 2016-09-22 NOTE — Progress Notes (Addendum)
Chief Complaint: GERD, dysphagia and abdominal pain, cough  HPI:  [name] is a 46 year old African-American female with past medical history as listed below, who was referred to me by Golden Circle, FNP for a complaint of GERD, dysphagia, abdominal pain and cough .      Patient was recently seen in the ER 09/17/16 for her persistent cough. Chest x-ray was normal. Patient was prescribed Tessalon Perles.   Today, the patient tells me that she started with a cough and reflux as well as bloating and gas about 2 months ago, she went to see her primary care provider about a month later and was started on Prilosec 20 mg once daily for 2 weeks, this seemed to help slightly with her reflux but she now continues with a cough worse when she eats and still tastes some "acid in my mouth" when she lays down to sleep. Patient also describes a few instances of food getting stuck on its way down her throat. Some foods seem to make her symptoms worse, such as pizza or greasy things. Currently she is also experiencing some epigastric pain which radiates down to her bellybutton. This is worse in the morning when she wakes up and seems to come in waves lasting a few minutes at a time. She rates this as a 1-2/10.   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 Patient start a daily baby aspirin about a year ago.   Patient denies fever, chills, blood in her stool, melena, fatigue, anorexia, recent change in diet, change in bowel habits, nausea or vomiting  Past Medical History:  Diagnosis Date  . Hypercholesteremia   . Hyperthyroidism   . Migraines    with asthma  . Slipping rib syndrome     Past Surgical History:  Procedure Laterality Date  . BREAST BIOPSY Right 06-26-14   benign per patient    Current Outpatient Prescriptions  Medication Sig Dispense  Refill  . aspirin 81 MG tablet Take 81 mg by mouth daily.    . benzonatate (TESSALON) 100 MG capsule Take 1 capsule (100 mg total) by mouth 3 (three) times daily as needed for cough. 21 capsule 0  . Carbamide Peroxide (EAR DROPS OT) Place in ear(s) as needed.     . cholecalciferol (VITAMIN D) 1000 units tablet Take 1,000 Units by mouth daily.    Marland Kitchen gabapentin (NEURONTIN) 100 MG capsule Take 1 capsule (100 mg total) by mouth at bedtime. 30 capsule 0  . Ibuprofen-Famotidine 800-26.6 MG TABS Take 1 tablet by mouth 3 (three) times daily as needed. 90 tablet 1  . Iron TABS Take by mouth.    . methimazole (TAPAZOLE) 5 MG tablet On M W F 25 tablet 2  . Pyridoxine HCl (B-6) 100 MG TABS Take by mouth.    . vitamin B-12 (CYANOCOBALAMIN) 1000 MCG tablet Take 1,000 mcg by mouth daily.    . vitamin C (ASCORBIC ACID) 500 MG tablet Take 500 mg by mouth daily.    Marland Kitchen omeprazole (PRILOSEC) 40 MG capsule Take 1 capsule (40 mg total) by mouth daily. 30 capsule 3   No current facility-administered medications for this visit.     Allergies as of 09/22/2016 - Review Complete 09/22/2016  Allergen Reaction Noted  . Topamax [topiramate]  05/27/2014    Family History  Problem Relation Age  of Onset  . Diabetes Mother   . Hypertension Mother   . Colon cancer Mother 26  . Breast cancer Mother   . Prostate cancer Father   . Stomach cancer Neg Hx   . Esophageal cancer Neg Hx     Social History   Social History  . Marital status: Married    Spouse name: N/A  . Number of children: 1  . Years of education: 29   Occupational History  . Mail Processor    Social History Main Topics  . Smoking status: Never Smoker  . Smokeless tobacco: Never Used  . Alcohol use 0.0 oz/week     Comment: On occasion   . Drug use: No  . Sexual activity: Yes    Partners: Male    Birth control/ protection: Condom   Other Topics Concern  . Not on file   Social History Narrative   Born and raised in [place].    Currently  resides in a house with her child. No pets. Fun: Go to the movies.    Denies religious beliefs effecting health care.     Review of Systems:    Constitutional: No weight loss, fever or chills Skin: No rash  Cardiovascular: No chest pain Respiratory: No SOB  Gastrointestinal: See HPI and otherwise negative Genitourinary: No dysuria Neurological: No headache, dizziness or syncope Musculoskeletal: No new muscle or joint pain Hematologic: No bleeding or bruising Psychiatric: No history of depression or anxiety   Physical Exam:  Vital signs: BP 110/72   Pulse 80   Ht 5\' 7"  (1.702 m)   Wt 162 lb (73.5 kg)   LMP 09/04/2016   BMI 25.37 kg/m   Constitutional:   Pleasant AA female appears to be in NAD, Well developed, Well nourished, alert and cooperative Head:  Normocephalic and atraumatic. Eyes:   PEERL, EOMI. No icterus. Conjunctiva pink. Ears:  Normal auditory acuity. Neck:  Supple Throat: Oral cavity and pharynx without inflammation, swelling or lesion.  Respiratory: Respirations even and unlabored. Lungs clear to auscultation bilaterally.   No wheezes, crackles, or rhonchi.  Cardiovascular: Normal S1, S2. No MRG. Regular rate and rhythm. No peripheral edema, cyanosis or pallor.  Gastrointestinal:  Soft, nondistended, mild epigastric ttp. No rebound or guarding. Normal bowel sounds. No appreciable masses or hepatomegaly. Rectal:  Not performed.  Msk:  Symmetrical without gross deformities. Without edema, no deformity or joint abnormality.  Neurologic:  Alert and  oriented x4;  grossly normal neurologically.  Skin:   Dry and intact without significant lesions or rashes. Psychiatric:  Demonstrates good judgement and reason without abnormal affect or behaviors.  MOST RECENT LABS AND IMAGING: CBC    Component Value Date/Time   WBC 5.8 08/24/2016 0753   RBC 4.99 08/24/2016 0753   HGB 14.3 08/24/2016 0753   HGB 14.1 07/23/2015 0936   HCT 43.5 08/24/2016 0753   PLT 233.0  08/24/2016 0753   MCV 87.2 08/24/2016 0753   MCH 28.5 05/18/2014 0215   MCHC 32.9 08/24/2016 0753   RDW 12.9 08/24/2016 0753   LYMPHSABS 1.5 05/18/2014 0215   MONOABS 0.6 05/18/2014 0215   EOSABS 0.0 05/18/2014 0215   BASOSABS 0.0 05/18/2014 0215    CMP     Component Value Date/Time   NA 138 08/24/2016 0753   K 3.8 08/24/2016 0753   CL 106 08/24/2016 0753   CO2 25 08/24/2016 0753   GLUCOSE 103 (H) 08/24/2016 0753   BUN 13 08/24/2016 0753   CREATININE 1.06 08/24/2016  0753   CALCIUM 9.0 08/24/2016 0753   PROT 7.1 08/24/2016 0753   ALBUMIN 4.2 08/24/2016 0753   AST 14 08/24/2016 0753   ALT 10 08/24/2016 0753   ALKPHOS 38 (L) 08/24/2016 0753   BILITOT 0.6 08/24/2016 0753   GFRNONAA 70 (L) 05/18/2014 0215   GFRAA 81 (L) 05/18/2014 0215    Assessment: 1. GERD: Started 2-1/2 months ago, slight relief from Prilosec 20 mg once daily for 2 weeks, continues with epigastric pain and cough, history of NSAID use; consider gastritis versus PUD versus other 2. Abdominal pain: Epigastrium to periumbilical area, worse in the morning; likely with above 3. Cough: Likely related to reflux 4. Dysphagia: Intermittently over the past 2-1/2 months; consider esophageal stricture versus ring versus web versus mass  Plan: 1. Scheduled patient for an EGD with Dr. Henrene Pastor, as he is supervising this morning, in the [facility] Did discuss risks, benefits, limitations and alternatives and the patient agrees to proceed. 2. Prescribed Omeprazole 40 mg once daily, 30-60 minutes before breakfast 3. Reviewed antireflux diet and lifestyle modifications. Provided informational handout. 4. Patient to follow in clinic per recommendations after time of EGD with Dr. Josefine Class, PA-C [facility] 09/22/2016, 9:11 AM  Cc: Golden Circle, FNP   Addendum: 09/23/16 1126 Received most recent colonoscopy done for family history of cancer. 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 Repeat was recommended in 5 years.  Will place patient in recall for [place] with Dr. Henrene Pastor 08/03/17 for family history of colon cancer.  Ellouise Newer, PA-C

## 2016-09-23 ENCOUNTER — Ambulatory Visit (AMBULATORY_SURGERY_CENTER): Payer: Federal, State, Local not specified - PPO | Admitting: Internal Medicine

## 2016-09-23 ENCOUNTER — Encounter: Payer: Self-pay | Admitting: Internal Medicine

## 2016-09-23 VITALS — BP 107/73 | HR 77 | Temp 98.0°F | Resp 13 | Ht 67.0 in | Wt 162.0 lb

## 2016-09-23 DIAGNOSIS — K222 Esophageal obstruction: Secondary | ICD-10-CM | POA: Diagnosis not present

## 2016-09-23 DIAGNOSIS — R1013 Epigastric pain: Secondary | ICD-10-CM

## 2016-09-23 DIAGNOSIS — R1310 Dysphagia, unspecified: Secondary | ICD-10-CM

## 2016-09-23 DIAGNOSIS — K219 Gastro-esophageal reflux disease without esophagitis: Secondary | ICD-10-CM

## 2016-09-23 DIAGNOSIS — R1319 Other dysphagia: Secondary | ICD-10-CM

## 2016-09-23 HISTORY — PX: UPPER GASTROINTESTINAL ENDOSCOPY: SHX188

## 2016-09-23 MED ORDER — SODIUM CHLORIDE 0.9 % IV SOLN: 500.0000 mL | INTRAVENOUS | Status: DC

## 2016-09-23 NOTE — Telephone Encounter (Signed)
Noted. Patient signed records release while in office. Records release was sent to [facility].

## 2016-09-23 NOTE — Op Note (Signed)
[facility] Patient Name: Christina [name] Procedure Date: 09/23/2016 9:50 AM MRN: [id number] Endoscopist: Docia Chuck. Henrene Pastor , MD Age: 46 Referring MD:  Date of Birth: [birth year] Gender: Female Account #: [id number] Procedure:                Upper GI endoscopy, with Venia Minks dilation of the                            esophagus-55f Indications:              Dysphagia, Epigastric abdominal pain, Suspected                            esophageal reflux Medicines:                Monitored Anesthesia Care Procedure:                Pre-Anesthesia Assessment:                           - Prior to the procedure, a History and Physical                            was performed, and patient medications and                            allergies were reviewed. The patient's tolerance of                            previous anesthesia was also reviewed. The risks                            and benefits of the procedure and the sedation                            options and risks were discussed with the patient.                            All questions were answered, and informed consent                            was obtained. Prior Anticoagulants: The patient has                            taken no previous anticoagulant or antiplatelet                            agents. ASA Grade Assessment: II - A patient with                            mild systemic disease. After reviewing the risks                            and benefits, the patient was deemed in  satisfactory condition to undergo the procedure.                           After obtaining informed consent, the endoscope was                            passed under direct vision. Throughout the                            procedure, the patient's blood pressure, pulse, and                            oxygen saturations were monitored continuously. The                            Endoscope was introduced through the mouth, and                             advanced to the second part of duodenum. The upper                            GI endoscopy was accomplished without difficulty.                            The patient tolerated the procedure well. Scope In: Scope Out: Findings:                 One mild benign-appearing, intrinsic stenosis was                            found 36 cm from the incisors. There was mild                            esophagitis as manifested by edema. The scope was                            withdrawn. Dilation was performed with a Maloney                            dilator with no resistance at 33 Fr. No resistance,                            heme, or difficulty. Tolerated well.                           The exam of the esophagus was otherwise normal.                           The stomach was normal.                           The examined duodenum was normal.                           The cardia and  gastric fundus were normal on                            retroflexion. Complications:            No immediate complications. Estimated Blood Loss:     Estimated blood loss: none. Impression:               1. GERD with mild esophagitis and mild esophageal                            stricture                           2. Otherwise normal examination. Recommendation:           1. Reflux precautions                           2. Omeprazole 40 mg daily                           3. Post-dilation diet                           4. Follow-up with Dr. Henrene Pastor in about 6 weeks. Docia Chuck. Henrene Pastor, MD 09/23/2016 10:32:25 AM This report has been signed electronically.

## 2016-09-23 NOTE — Progress Notes (Signed)
Report to PACU, RN, vss, BBS= Clear.  

## 2016-09-23 NOTE — Patient Instructions (Signed)
YOU HAD AN ENDOSCOPIC PROCEDURE TODAY AT [facility]:   Refer to the procedure report that was given to you for any specific questions about what was found during the examination.  If the procedure report does not answer your questions, please call your gastroenterologist to clarify.  If you requested that your care partner not be given the details of your procedure findings, then the procedure report has been included in a sealed envelope for you to review at your convenience later.  YOU SHOULD EXPECT: Some feelings of bloating in the abdomen. Passage of more gas than usual.  Walking can help get rid of the air that was put into your GI tract during the procedure and reduce the bloating. If you had a lower endoscopy (such as a colonoscopy or flexible sigmoidoscopy) you may notice spotting of blood in your stool or on the toilet paper. If you underwent a bowel prep for your procedure, you may not have a normal bowel movement for a few days.  Please Note:  You might notice some irritation and congestion in your nose or some drainage.  This is from the oxygen used during your procedure.  There is no need for concern and it should clear up in a day or so.  SYMPTOMS TO REPORT IMMEDIATELY:    Following upper endoscopy (EGD)  Vomiting of blood or coffee ground material  New chest pain or pain under the shoulder blades  Painful or persistently difficult swallowing  New shortness of breath  Fever of 100F or higher  Black, tarry-looking stools  For urgent or emergent issues, a gastroenterologist can be reached at any hour by calling [phone].   DIET:  FOLLOW DILATION DIET TODAY- SEE HANDOUTDrink plenty of fluids but you should avoid alcoholic beverages for 24 hours.  ACTIVITY:  You should plan to take it easy for the rest of today and you should NOT DRIVE or use heavy machinery until tomorrow (because of the sedation medicines used during the test).    FOLLOW UP: Our staff  will call the number listed on your records the next business day following your procedure to check on you and address any questions or concerns that you may have regarding the information given to you following your procedure. If we do not reach you, we will leave a message.  However, if you are feeling well and you are not experiencing any problems, there is no need to return our call.  We will assume that you have returned to your regular daily activities without incident.  SIGNATURES/CONFIDENTIALITY: You and/or your care partner have signed paperwork which will be entered into your electronic medical record.  These signatures attest to the fact that that the information above on your After Visit Summary has been reviewed and is understood.  Full responsibility of the confidentiality of this discharge information lies with you and/or your care-partner.  FOLLOW DILATION DIET TODAY  Take normal medications, including Omeprazole 40 mg daily- take 1 tablet 30 minutes before breakfast  Call Dr. Blanch Media office in the next few days to set up a follow up appointment for 6 weeks   Please read over handout about anti-reflux precautions

## 2016-09-23 NOTE — Progress Notes (Signed)
Called to room to assist during endoscopic procedure.  Patient ID and intended procedure confirmed with present staff. Received instructions for my participation in the procedure from the performing physician.  

## 2016-09-24 ENCOUNTER — Telehealth: Payer: Self-pay | Admitting: *Deleted

## 2016-09-24 ENCOUNTER — Telehealth: Payer: Self-pay | Admitting: Family

## 2016-09-24 ENCOUNTER — Ambulatory Visit
Admission: RE | Admit: 2016-09-24 | Discharge: 2016-09-24 | Disposition: A | Discharge status: Home / Self Care | Payer: Federal, State, Local not specified - PPO | Source: Ambulatory Visit | Attending: Family Medicine | Admitting: Family Medicine

## 2016-09-24 DIAGNOSIS — M5416 Radiculopathy, lumbar region: Secondary | ICD-10-CM

## 2016-09-24 MED ORDER — DIAZEPAM 5 MG PO TABS: ORAL_TABLET | ORAL | 0 refills | Status: DC

## 2016-09-24 NOTE — Telephone Encounter (Signed)
Printed valium Can we call and tell her either here for pick up or please fax.  Thank you!

## 2016-09-24 NOTE — Telephone Encounter (Signed)
Pt called in and said that she had to resch the MRI because she could not do it.  He said that she is going to need some type of meds to help her though it.    She would like for you to call her when you get a chance

## 2016-09-24 NOTE — Telephone Encounter (Signed)
Discussed with pt. Faxed to pharmacy.

## 2016-09-24 NOTE — Telephone Encounter (Signed)
  Follow up Call-  Call back number 09/23/2016  Post procedure Call Back phone  # [phone]  Permission to leave phone message Yes  Some recent data might be hidden     Patient questions:  Do you have a fever, pain , or abdominal swelling? No. Pain Score  0 *  Have you tolerated food without any problems? Yes.    Have you been able to return to your normal activities? Yes.    Do you have any questions about your discharge instructions: Diet   No. Medications  No. Follow up visit  No.  Do you have questions or concerns about your Care? No.  Actions: * If pain score is 4 or above: No action needed, pain <4. Soreness in throat,encourage gargles and any problems to let us know,verbalize understanding.

## 2016-09-29 ENCOUNTER — Ambulatory Visit (INDEPENDENT_AMBULATORY_CARE_PROVIDER_SITE_OTHER): Payer: Federal, State, Local not specified - PPO | Admitting: Endocrinology

## 2016-09-29 ENCOUNTER — Encounter: Payer: Self-pay | Admitting: Endocrinology

## 2016-09-29 VITALS — BP 108/72 | HR 77 | Wt 161.4 lb

## 2016-09-29 DIAGNOSIS — E0590 Thyrotoxicosis, unspecified without thyrotoxic crisis or storm: Secondary | ICD-10-CM | POA: Diagnosis not present

## 2016-09-29 LAB — TSH: TSH: 2.4 u[IU]/mL (ref 0.35–4.50)

## 2016-09-29 LAB — T4, FREE: Free T4: 0.85 ng/dL (ref 0.60–1.60)

## 2016-09-29 NOTE — Patient Instructions (Signed)
A thyroid blood test is requested for you today.  We'll let you know about the results.   if ever you have fever while taking methimazole, stop it and call us, because of the risk of a rare side-effect.   Please come back for a follow-up appointment in 6 months.

## 2016-09-29 NOTE — Progress Notes (Signed)
Subjective:    Patient ID: Christina [name]. Christina [name], female    DOB: February 01, 1970, 46 y.o.   MRN: [id number]  HPI Pt returns for f/u of hyperthyroidism (dx'ed in early 2015, in [place]; nuc med scan showed diffuse uptake (45% at 24 hrs); [place] in 2017 showed diffuse goiter, with multiple very small nodules; pt is unaware why tapazole was chosen as rx, but she wishes to continue). she takes tapazole as rx'ed.  pt states she feels well in general.   Past Medical History:  Diagnosis Date  . Hypercholesteremia   . Hyperthyroidism   . Migraines    with asthma  . Slipping rib syndrome     Past Surgical History:  Procedure Laterality Date  . BREAST BIOPSY Right 06-26-14   benign per patient    Social History   Social History  . Marital status: Married    Spouse name: N/A  . Number of children: 1  . Years of education: 25   Occupational History  . Mail Processor    Social History Main Topics  . Smoking status: Never Smoker  . Smokeless tobacco: Never Used  . Alcohol use 0.0 oz/week     Comment: On occasion   . Drug use: No  . Sexual activity: Yes    Partners: Male    Birth control/ protection: Condom   Other Topics Concern  . Not on file   Social History Narrative   Born and raised in [place].    Currently resides in a house with her child. No pets. Fun: Go to the movies.    Denies religious beliefs effecting health care.     Current Outpatient Prescriptions on File Prior to Visit  Medication Sig Dispense Refill  . aspirin 81 MG tablet Take 81 mg by mouth daily.    . benzonatate (TESSALON) 100 MG capsule Take 1 capsule (100 mg total) by mouth 3 (three) times daily as needed for cough. 21 capsule 0  . Carbamide Peroxide (EAR DROPS OT) Place in ear(s) as needed.     . cholecalciferol (VITAMIN D) 1000 units tablet Take 1,000 Units by mouth daily.    . diazepam (VALIUM) 5 MG tablet One tab by mouth, 2 hours before procedure. 2 tablet 0  . gabapentin (NEURONTIN) 100 MG capsule Take 1  capsule (100 mg total) by mouth at bedtime. (Patient not taking: Reported on 09/23/2016) 30 capsule 0  . Ibuprofen-Famotidine 800-26.6 MG TABS Take 1 tablet by mouth 3 (three) times daily as needed. 90 tablet 1  . Iron TABS Take by mouth.    . methimazole (TAPAZOLE) 5 MG tablet On M W F 25 tablet 2  . omeprazole (PRILOSEC) 40 MG capsule Take 1 capsule (40 mg total) by mouth daily. 30 capsule 3  . Pyridoxine HCl (B-6) 100 MG TABS Take by mouth.    . vitamin B-12 (CYANOCOBALAMIN) 1000 MCG tablet Take 1,000 mcg by mouth daily.    . vitamin C (ASCORBIC ACID) 500 MG tablet Take 500 mg by mouth daily.     Current Facility-Administered Medications on File Prior to Visit  Medication Dose Route Frequency Provider Last Rate Last Dose  . 0.9 %  sodium chloride infusion  500 mL Intravenous Continuous Irene Shipper, MD        Allergies  Allergen Reactions  . Topamax [Topiramate]     Pain in legs, SOB, and almost passed out    Family History  Problem Relation Age of Onset  . Diabetes Mother   .  Hypertension Mother   . Colon cancer Mother 32  . Breast cancer Mother   . Prostate cancer Father   . Stomach cancer Neg Hx   . Esophageal cancer Neg Hx     BP 108/72   Pulse 77   Wt 161 lb 6.4 oz (73.2 kg)   LMP 09/04/2016   SpO2 97%   BMI 25.28 kg/m    Review of Systems Denies fever    Objective:   Physical Exam VITAL SIGNS:  See vs page.   GENERAL: no distress.  NECK: thyroid is slightly and diffusely enlarged.       Assessment & Plan:  Hyperthyroidism: due for recheck.  Patient Instructions  A thyroid blood test is requested for you today.  We'll let you know about the results.   if ever you have fever while taking methimazole, stop it and call us, because of the risk of a rare side-effect.   Please come back for a follow-up appointment in 6 months.

## 2016-10-03 ENCOUNTER — Ambulatory Visit
Admission: RE | Admit: 2016-10-03 | Discharge: 2016-10-03 | Disposition: A | Discharge status: Home / Self Care | Payer: Federal, State, Local not specified - PPO | Source: Ambulatory Visit | Attending: Family Medicine | Admitting: Family Medicine

## 2016-10-03 DIAGNOSIS — M5416 Radiculopathy, lumbar region: Secondary | ICD-10-CM | POA: Diagnosis not present

## 2016-10-06 ENCOUNTER — Encounter: Payer: Self-pay | Admitting: Family Medicine

## 2016-10-13 DIAGNOSIS — H60541 Acute eczematoid otitis externa, right ear: Secondary | ICD-10-CM | POA: Diagnosis not present

## 2016-10-13 DIAGNOSIS — H6123 Impacted cerumen, bilateral: Secondary | ICD-10-CM | POA: Diagnosis not present

## 2016-10-13 DIAGNOSIS — L299 Pruritus, unspecified: Secondary | ICD-10-CM | POA: Insufficient documentation

## 2016-10-20 DIAGNOSIS — K080 Exfoliation of teeth due to systemic causes: Secondary | ICD-10-CM | POA: Diagnosis not present

## 2016-10-26 ENCOUNTER — Encounter: Payer: Self-pay | Admitting: Family Medicine

## 2016-10-26 ENCOUNTER — Ambulatory Visit (INDEPENDENT_AMBULATORY_CARE_PROVIDER_SITE_OTHER): Payer: Federal, State, Local not specified - PPO | Admitting: Family Medicine

## 2016-10-26 VITALS — BP 98/72 | Ht 67.0 in | Wt 158.0 lb

## 2016-10-26 DIAGNOSIS — M5416 Radiculopathy, lumbar region: Secondary | ICD-10-CM | POA: Diagnosis not present

## 2016-10-26 DIAGNOSIS — M999 Biomechanical lesion, unspecified: Secondary | ICD-10-CM

## 2016-10-26 DIAGNOSIS — M533 Sacrococcygeal disorders, not elsewhere classified: Secondary | ICD-10-CM

## 2016-10-26 NOTE — Assessment & Plan Note (Signed)
Decision today to treat with OMT was based on Physical Exam  After verbal consent patient was treated with HVLA, ME, FPR techniques in cervical, thoracic, lumbar and sacral areas  Patient tolerated the procedure well with improvement in symptoms  Patient given exercises, stretches and lifestyle modifications  See medications in patient instructions if given  Patient will follow up in 4-8 weeks 

## 2016-10-26 NOTE — Progress Notes (Signed)
[name] [place], [place] [address] Phone: [phone] Subjective:    I'm seeing this patient by the request  of:    CC:   [id number]  Christina [name] is a 46 y.o. female coming in with for follow up for back pain. She has switched jobs and does not have to do all of the strenuous work that she was previously doing. She does walk a lot at her newest job which does aggravate her back.  Onset-  Location Duration-  Character- Aggravating factors- Reliving factors-  Therapies tried-  Severity-     Past Medical History:  Diagnosis Date  . Hypercholesteremia   . Hyperthyroidism   . Migraines    with asthma  . Slipping rib syndrome    Past Surgical History:  Procedure Laterality Date  . BREAST BIOPSY Right 06-26-14   benign per patient   Social History   Social History  . Marital status: Married    Spouse name: N/A  . Number of children: 1  . Years of education: 50   Occupational History  . Mail Processor    Social History Main Topics  . Smoking status: Never Smoker  . Smokeless tobacco: Never Used  . Alcohol use 0.0 oz/week     Comment: On occasion   . Drug use: No  . Sexual activity: Yes    Partners: Male    Birth control/ protection: Condom   Other Topics Concern  . Not on file   Social History Narrative   Born and raised in [place].    Currently resides in a house with her child. No pets. Fun: Go to the movies.    Denies religious beliefs effecting health care.    Allergies  Allergen Reactions  . Topamax [Topiramate]     Pain in legs, SOB, and almost passed out   Family History  Problem Relation Age of Onset  . Diabetes Mother   . Hypertension Mother   . Colon cancer Mother 12  . Breast cancer Mother   . Prostate cancer Father   . Stomach cancer Neg Hx   . Esophageal cancer Neg Hx      Past medical history, social, surgical and family history all reviewed in electronic medical record.  No  pertanent information unless stated regarding to the chief complaint.   Review of Systems:Review of systems updated and as accurate as of 10/26/16  No headache, visual changes, nausea, vomiting, diarrhea, constipation, dizziness, abdominal pain, skin rash, fevers, chills, night sweats, weight loss, swollen lymph nodes, body aches, joint swelling, muscle aches, chest pain, shortness of breath, mood changes.   Objective  There were no vitals taken for this visit. Systems examined below as of 10/26/16   General: No apparent distress alert and oriented x3 mood and affect normal, dressed appropriately.  HEENT: Pupils equal, extraocular movements intact  Respiratory: Patient's speak in full sentences and does not appear short of breath  Cardiovascular: No lower extremity edema, non tender, no erythema  Skin: Warm dry intact with no signs of infection or rash on extremities or on axial skeleton.  Abdomen: Soft nontender  Neuro: Cranial nerves II through XII are intact, neurovascularly intact in all extremities with 2+ DTRs and 2+ pulses.  Lymph: No lymphadenopathy of posterior or anterior cervical chain or axillae bilaterally.  Gait normal with good balance and coordination.  MSK:  Non tender with full range of motion and good stability and symmetric strength and tone  of shoulders, elbows, wrist, hip, knee and ankles bilaterally.     Impression and Recommendations:     This case required medical decision making of moderate complexity.      Note: This dictation was prepared with Dragon dictation along with smaller phrase technology. Any transcriptional errors that result from this process are unintentional.

## 2016-10-26 NOTE — Assessment & Plan Note (Signed)
Stable. Back the patient's baseline. Encourage core strengthening. I think with patient doing less lifting it seems to be improving as well.
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
## 2016-10-26 NOTE — Patient Instructions (Signed)
Good to see you  You are good to go  See me again in 6 weeks but I think you are back at your baseline.
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
## 2016-10-29 DIAGNOSIS — K080 Exfoliation of teeth due to systemic causes: Secondary | ICD-10-CM | POA: Diagnosis not present

## 2016-11-11 ENCOUNTER — Other Ambulatory Visit: Payer: Self-pay

## 2016-11-11 ENCOUNTER — Telehealth: Payer: Self-pay | Admitting: Endocrinology

## 2016-11-11 MED ORDER — METHIMAZOLE 5 MG PO TABS: ORAL_TABLET | ORAL | 2 refills | Status: DC

## 2016-11-11 NOTE — Telephone Encounter (Signed)
Done

## 2016-11-11 NOTE — Telephone Encounter (Signed)
Patient need a refill of methimazole send to [facility] [address]

## 2016-11-19 ENCOUNTER — Other Ambulatory Visit: Payer: Self-pay | Admitting: Endocrinology

## 2016-11-19 MED ORDER — METHIMAZOLE 5 MG PO TABS: 5.0000 mg | ORAL_TABLET | ORAL | 5 refills | Status: DC

## 2016-11-23 ENCOUNTER — Ambulatory Visit (INDEPENDENT_AMBULATORY_CARE_PROVIDER_SITE_OTHER): Payer: Federal, State, Local not specified - PPO | Admitting: Internal Medicine

## 2016-11-23 ENCOUNTER — Encounter: Payer: Self-pay | Admitting: Internal Medicine

## 2016-11-23 VITALS — BP 100/76 | HR 72 | Ht 67.0 in | Wt 156.1 lb

## 2016-11-23 DIAGNOSIS — Z800 Family history of malignant neoplasm of digestive organs: Secondary | ICD-10-CM

## 2016-11-23 DIAGNOSIS — R1013 Epigastric pain: Secondary | ICD-10-CM | POA: Diagnosis not present

## 2016-11-23 DIAGNOSIS — K222 Esophageal obstruction: Secondary | ICD-10-CM | POA: Diagnosis not present

## 2016-11-23 DIAGNOSIS — K219 Gastro-esophageal reflux disease without esophagitis: Secondary | ICD-10-CM | POA: Diagnosis not present

## 2016-11-23 MED ORDER — OMEPRAZOLE 40 MG PO CPDR: 40.0000 mg | DELAYED_RELEASE_CAPSULE | Freq: Every day | ORAL | 11 refills | Status: DC

## 2016-11-23 NOTE — Patient Instructions (Addendum)
We have sent the following medications to your pharmacy for you to pick up at your convenience:  Omeprazole.  Please follow up in one year  

## 2016-11-23 NOTE — Progress Notes (Signed)
HISTORY OF PRESENT ILLNESS:  Christina [name] is a 46 y.o. female who was initially evaluated by the GI physician assistant 09/22/2016 regarding new onset GERD with epigastric pain, cough, and intermittent dysphagia. She was advised with regards to reflux precautions and prescribed omeprazole 40 mg daily. She underwent upper endoscopy 09/23/2016. She was found to have mild distal esophagitis as well as a distal peptic stricture was dilated with a 54 [place] Maloney dilator. Is continued on omeprazole and asked to follow-up at this time. She has been compliant with reflux precautions. She has lost a little bit of weight and is pleased. She has had no issues with omeprazole. She has had resolution of reflux symptoms, epigastric pain, and dysphagia. No new complaints. We were able to find her previous colonoscopy from [facility]. The exam was dated 08/03/2012. 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 No other abnormalities. Follow-up in 5 years due to family history recommended (colon cancer in first-degree relative). No new complaints today  REVIEW OF SYSTEMS:  All non-GI ROS negative entirely  Past Medical History:  Diagnosis Date  . Hypercholesteremia   . Hyperthyroidism   . Migraines    with asthma  . Slipping rib syndrome     Past Surgical History:  Procedure Laterality Date  . BREAST BIOPSY Right 06-26-14   benign per patient    Social History Christina [name]  reports that she has never smoked. She has never used smokeless tobacco. She reports that she drinks alcohol. She reports that she does not use drugs.  family history includes Breast cancer in her mother; Colon cancer (age of onset: 56) in her mother; Diabetes in her mother; Hypertension in her mother; Prostate cancer in her father.  Allergies  Allergen Reactions  . Topamax [Topiramate]     Pain in legs, SOB, and almost passed out       PHYSICAL EXAMINATION: Vital signs: BP 100/76 (BP Location: Left Arm, Patient Position:  Sitting, Cuff Size: Normal)   Pulse 72   Ht 5\' 7"  (1.702 m) Comment: height measured without shoes  Wt 156 lb 2 oz (70.8 kg)   LMP 11/16/2016   BMI 24.45 kg/m   Constitutional: generally well-appearing, no acute distress Psychiatric: alert and oriented x3, cooperative Abdomen: not reexamined  Neuro:  Gross deficits   ASSESSMENT:   #1. 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 Asymptomatic post dilation on PPI #2. Family history of colon cancer in first-degree relative #3. Negative screening colonoscopy July 2014   PLAN:  #1. Continue reflux precautions #2. Continue omeprazole 40 mg daily #3. Routine office follow-up one year. Contact the office in the interim for questions or problems #4. Repeat screening colonoscopy around July 2019. We have placed recall in our system.   She is aware 15 minutes spent face-to-face with the patient. Greater than 50% a time use for counseling regarding her GERD with complications of esophagitis and stricture. We also discussed repeat screening colonoscopy interval

## 2016-12-07 ENCOUNTER — Other Ambulatory Visit: Payer: Self-pay | Admitting: Neurology

## 2016-12-07 ENCOUNTER — Ambulatory Visit: Payer: Federal, State, Local not specified - PPO | Admitting: Family Medicine

## 2016-12-07 ENCOUNTER — Encounter: Payer: Self-pay | Admitting: Family Medicine

## 2016-12-07 VITALS — BP 102/66 | HR 88 | Ht 67.0 in | Wt 158.0 lb

## 2016-12-07 DIAGNOSIS — M999 Biomechanical lesion, unspecified: Secondary | ICD-10-CM

## 2016-12-07 DIAGNOSIS — M533 Sacrococcygeal disorders, not elsewhere classified: Secondary | ICD-10-CM | POA: Diagnosis not present

## 2016-12-07 DIAGNOSIS — G43109 Migraine with aura, not intractable, without status migrainosus: Secondary | ICD-10-CM

## 2016-12-07 DIAGNOSIS — G43409 Hemiplegic migraine, not intractable, without status migrainosus: Secondary | ICD-10-CM

## 2016-12-07 NOTE — Progress Notes (Signed)
[name] [place], [place] [address] Phone: [phone] Subjective:    I'm seeing this patient by the request  of:    CC: Back and neck pain  HWE:[id number]  Christina [name] is a 46 y.o. female coming in with complaint of back pain. She has been doing good but is here for OMT.  Patient has been seen previously and advanced imaging of the back did not show any bony normality.  We discussed with patient about icing regimen and home exercises.        Past Medical History:  Diagnosis Date  . Hypercholesteremia   . Hyperthyroidism   . Migraines    with asthma  . Slipping rib syndrome    Past Surgical History:  Procedure Laterality Date  . BREAST BIOPSY Right 06-26-14   benign per patient   Social History   Socioeconomic History  . Marital status: Married    Spouse name: Not on file  . Number of children: 1  . Years of education: 76  . Highest education level: Not on file  Social Needs  . Financial resource strain: Not on file  . Food insecurity - worry: Not on file  . Food insecurity - inability: Not on file  . Transportation needs - medical: Not on file  . Transportation needs - non-medical: Not on file  Occupational History  . Occupation: Geologist, engineering  Tobacco Use  . Smoking status: Never Smoker  . Smokeless tobacco: Never Used  Substance and Sexual Activity  . Alcohol use: Yes    Alcohol/week: 0.0 oz    Comment: On occasion   . Drug use: No  . Sexual activity: Yes    Partners: Male    Birth control/protection: Condom  Other Topics Concern  . Not on file  Social History Narrative   Born and raised in [place].    Currently resides in a house with her child. No pets. Fun: Go to the movies.    Denies religious beliefs effecting health care.    Allergies  Allergen Reactions  . Topamax [Topiramate]     Pain in legs, SOB, and almost passed out   Family History  Problem Relation Age of Onset  . Diabetes Mother     . Hypertension Mother   . Colon cancer Mother 58  . Breast cancer Mother   . Prostate cancer Father   . Stomach cancer Neg Hx   . Esophageal cancer Neg Hx      Past medical history, social, surgical and family history all reviewed in electronic medical record.  No pertanent information unless stated regarding to the chief complaint.   Review of Systems:Review of systems updated and as accurate as of 12/07/16  No headache, visual changes, nausea, vomiting, diarrhea, constipation, dizziness, abdominal pain, skin rash, fevers, chills, night sweats, weight loss, swollen lymph nodes, body aches, joint swelling, muscle aches, chest pain, shortness of breath, mood changes.   Objective  Last menstrual period 11/16/2016. Systems examined below as of 12/07/16   General: No apparent distress alert and oriented x3 mood and affect normal, dressed appropriately.  HEENT: Pupils equal, extraocular movements intact  Respiratory: Patient's speak in full sentences and does not appear short of breath  Cardiovascular: No lower extremity edema, non tender, no erythema  Skin: Warm dry intact with no signs of infection or rash on extremities or on axial skeleton.  Abdomen: Soft nontender  Neuro: Cranial nerves II through XII are intact, neurovascularly  intact in all extremities with 2+ DTRs and 2+ pulses.  Lymph: No lymphadenopathy of posterior or anterior cervical chain or axillae bilaterally.  Gait normal with good balance and coordination.  MSK:  Non tender with full range of motion and good stability and symmetric strength and tone of shoulders, elbows, wrist, hip, knee and ankles bilaterally.  Back Exam:  Inspection: Unremarkable  Motion: Flexion 45 deg, Extension 20 deg, Side Bending to 35 deg bilaterally,  Rotation to 45 deg bilaterally  SLR laying: Negative  XSLR laying: Negative  Palpable tenderness: Pain over the sacroiliac joint bilaterally. FABER: Positive bilateral. Sensory change: Gross  sensation intact to all lumbar and sacral dermatomes.  Reflexes: 2+ at both patellar tendons, 2+ at achilles tendons, Babinski's downgoing.  Strength at foot  Plantar-flexion: 5/5 Dorsi-flexion: 5/5 Eversion: 5/5 Inversion: 5/5  Leg strength  Quad: 5/5 Hamstring: 5/5 Hip flexor: 5/5 Hip abductors: 5/5  Gait unremarkable.  Osteopathic findings C2 flexed rotated and side bent right C4 flexed rotated and side bent left C7 flexed rotated and side bent left T3 extended rotated and side bent right inhaled third rib T9 extended rotated and side bent left L3 flexed rotated and side bent right Sacrum right on right    Impression and Recommendations:     This case required medical decision making of moderate complexity.      Note: This dictation was prepared with Dragon dictation along with smaller phrase technology. Any transcriptional errors that result from this process are unintentional.

## 2016-12-07 NOTE — Assessment & Plan Note (Signed)
Decision today to treat with OMT was based on Physical Exam  After verbal consent patient was treated with HVLA, ME, FPR techniques in cervical, thoracic, rib, lumbar and sacral areas  Patient tolerated the procedure well with improvement in symptoms  Patient given exercises, stretches and lifestyle modifications  See medications in patient instructions if given  Patient will follow up in 8 weeks 

## 2016-12-07 NOTE — Assessment & Plan Note (Signed)
Improved overall.  Some mild tightness noted.  Patient has been doing really well.  Continued conservative therapy and follow-up with me again in 2 months.

## 2016-12-07 NOTE — Patient Instructions (Signed)
Happy holidays!  See me again in 2 months

## 2016-12-14 ENCOUNTER — Telehealth: Payer: Self-pay | Admitting: Family Medicine

## 2016-12-14 NOTE — Telephone Encounter (Signed)
Pt called stating that she was seen by Dr [name] last Tuesday and was not having any issues after the visit. Beginning on Friday, she began experiencing lower back pain. She has been using ice and aleve but nothing seems to be helping. She said that it is very painful and would like to be seen today or tomorrow. Please advise.

## 2016-12-14 NOTE — Telephone Encounter (Signed)
Called patient to schedule tmrw.

## 2016-12-15 ENCOUNTER — Encounter: Payer: Self-pay | Admitting: Family Medicine

## 2016-12-15 ENCOUNTER — Ambulatory Visit: Payer: Federal, State, Local not specified - PPO | Admitting: Family Medicine

## 2016-12-15 VITALS — BP 110/76 | HR 85 | Ht 67.0 in | Wt 158.0 lb

## 2016-12-15 DIAGNOSIS — M999 Biomechanical lesion, unspecified: Secondary | ICD-10-CM

## 2016-12-15 DIAGNOSIS — M533 Sacrococcygeal disorders, not elsewhere classified: Secondary | ICD-10-CM

## 2016-12-15 MED ORDER — TIZANIDINE HCL 4 MG PO TABS: 4.0000 mg | ORAL_TABLET | Freq: Every evening | ORAL | 2 refills | Status: AC

## 2016-12-15 NOTE — Progress Notes (Signed)
[name] [address] [place], [place] [address] Phone: [phone] Subjective:    I'm seeing this patient by the request  of:    CC: Back pain   [id number]  Christina [name] is a 46 y.o. female coming in for back pain. Her pain started on Sunday. She has been icing it and states that her pain has become worse which is causing problems standing from a seated position. Has been having more pain overall.  Patient states that unfortunately no radiation down the leg.  Patient states that it seems to be very aggravated and was unable to even get out of bed yesterday.  Rates the severity of pain is 7 out of 10.  Started taking anti-inflammatories with mild improvement.       Past Medical History:  Diagnosis Date  . Hypercholesteremia   . Hyperthyroidism   . Migraines    with asthma  . Slipping rib syndrome    Past Surgical History:  Procedure Laterality Date  . BREAST BIOPSY Right 06-26-14   benign per patient   Social History   Socioeconomic History  . Marital status: Married    Spouse name: None  . Number of children: 1  . Years of education: 33  . Highest education level: None  Social Needs  . Financial resource strain: None  . Food insecurity - worry: None  . Food insecurity - inability: None  . Transportation needs - medical: None  . Transportation needs - non-medical: None  Occupational History  . Occupation: Geologist, engineering  Tobacco Use  . Smoking status: Never Smoker  . Smokeless tobacco: Never Used  Substance and Sexual Activity  . Alcohol use: Yes    Alcohol/week: 0.0 oz    Comment: On occasion   . Drug use: No  . Sexual activity: Yes    Partners: Male    Birth control/protection: Condom  Other Topics Concern  . None  Social History Narrative   Born and raised in [place].    Currently resides in a house with her child. No pets. Fun: Go to the movies.    Denies religious beliefs effecting health care.    Allergies    Allergen Reactions  . Topamax [Topiramate]     Pain in legs, SOB, and almost passed out   Family History  Problem Relation Age of Onset  . Diabetes Mother   . Hypertension Mother   . Colon cancer Mother 80  . Breast cancer Mother   . Prostate cancer Father   . Stomach cancer Neg Hx   . Esophageal cancer Neg Hx      Past medical history, social, surgical and family history all reviewed in electronic medical record.  No pertanent information unless stated regarding to the chief complaint.   Review of Systems:Review of systems updated and as accurate as of 12/15/16  No headache, visual changes, nausea, vomiting, diarrhea, constipation, dizziness, abdominal pain, skin rash, fevers, chills, night sweats, weight loss, swollen lymph nodes, body aches, joint swelling,  chest pain, shortness of breath, mood changes.  Positive muscle aches  Objective  Blood pressure 110/76, pulse 85, height 5\' 7"  (1.702 m), weight 158 lb (71.7 kg), last menstrual period 11/16/2016, SpO2 98 %. Systems examined below as of 12/15/16   General: No apparent distress alert and oriented x3 mood and affect normal, dressed appropriately.  HEENT: Pupils equal, extraocular movements intact  Respiratory: Patient's speak in full sentences and does not appear short of breath  Cardiovascular: No lower extremity edema, non tender, no erythema  Skin: Warm dry intact with no signs of infection or rash on extremities or on axial skeleton.  Abdomen: Soft nontender  Neuro: Cranial nerves II through XII are intact, neurovascularly intact in all extremities with 2+ DTRs and 2+ pulses.  Lymph: No lymphadenopathy of posterior or anterior cervical chain or axillae bilaterally.  Gait cautious MSK:  Non tender with full range of motion and good stability and symmetric strength and tone of shoulders, elbows, wrist, hip, knee and ankles bilaterally.  Back Exam:  Inspection: Loss of lordosis Motion: Flexion 25 deg, Extension 15 deg,  Side Bending to 30 deg bilaterally,  Rotation to 25 deg bilaterally  SLR laying: Negative  XSLR laying: Negative  Palpable tenderness: Significant discomfort in the paraspinal musculature on the lumbosacral area bilaterally.Marland Kitchen FABER: Significant tightness bilaterally. Sensory change: Gross sensation intact to all lumbar and sacral dermatomes.  Reflexes: 2+ at both patellar tendons, 2+ at achilles tendons, Babinski's downgoing.  Strength at foot  Plantar-flexion: 5/5 Dorsi-flexion: 5/5 Eversion: 5/5 Inversion: 5/5  Leg strength  Quad: 5/5 Hamstring: 5/5 Hip flexor: 5/5 Hip abductors: 5/5  Gait unremarkable.  Osteopathic findings C2 flexed rotated and side bent right T3 extended rotated and side bent right inhaled third rib T7 extended rotated and side bent left L2 flexed rotated and side bent right Sacrum right on right    Impression and Recommendations:     This case required medical decision making of moderate complexity.      Note: This dictation was prepared with Dragon dictation along with smaller phrase technology. Any transcriptional errors that result from this process are unintentional.

## 2016-12-15 NOTE — Patient Instructions (Addendum)
Good to see you  Ice is your friend zanaflex at night next 3 nights Starting tomorrow Duexis 3 times a day for 3 days See me again in 6-8 days (ok to double book)

## 2016-12-15 NOTE — Assessment & Plan Note (Signed)
Decision today to treat with OMT was based on Physical Exam  After verbal consent patient was treated with HVLA, ME, FPR techniques in cervical, thoracic, lumbar and sacral areas  Patient tolerated the procedure well with improvement in symptoms  Patient given exercises, stretches and lifestyle modifications  See medications in patient instructions if given  Patient will follow up in 1 weeks 
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
## 2016-12-23 ENCOUNTER — Encounter: Payer: Self-pay | Admitting: Family Medicine

## 2016-12-23 ENCOUNTER — Ambulatory Visit: Payer: Federal, State, Local not specified - PPO | Admitting: Family Medicine

## 2016-12-23 VITALS — BP 106/82 | HR 78 | Ht 67.0 in | Wt 154.0 lb

## 2016-12-23 DIAGNOSIS — M999 Biomechanical lesion, unspecified: Secondary | ICD-10-CM | POA: Diagnosis not present

## 2016-12-23 DIAGNOSIS — M533 Sacrococcygeal disorders, not elsewhere classified: Secondary | ICD-10-CM

## 2016-12-23 NOTE — Assessment & Plan Note (Signed)
Decision today to treat with OMT was based on Physical Exam  After verbal consent patient was treated with HVLA, ME, FPR techniques in cervical, thoracic, rib, lumbar and sacral areas  Patient tolerated the procedure well with improvement in symptoms  Patient given exercises, stretches and lifestyle modifications  See medications in patient instructions if given  Patient will follow up in 4-6 weeks 

## 2016-12-23 NOTE — Patient Instructions (Signed)
Good to see you  Christina [name] is your friend.  I am glad better  See you next year

## 2016-12-23 NOTE — Assessment & Plan Note (Signed)
Continues to have some difficulty.  Patient continues to have what to be some irritation and muscle spasms.  Has a Zanaflex if needed at night.  Continue the other vitamins and supplementations.  Encourage patient to increase activities again with the range of motion.  Follow-up with me again in 4-6 weeks

## 2016-12-23 NOTE — Progress Notes (Signed)
[name] [address] [place], [place] [address] Phone: [phone] Subjective:    CC: neck pain   [id number]  Christina [name] is a 46 y.o. female coming in for follow up for back pain. She does have a small spot on the right side that is tender. She has pain with flexion but over improved after the injections last week.  Patient states that she is near her baseline again.  Still some mild tightness of the right lower side.       Past Medical History:  Diagnosis Date  . Hypercholesteremia   . Hyperthyroidism   . Migraines    with asthma  . Slipping rib syndrome    Past Surgical History:  Procedure Laterality Date  . BREAST BIOPSY Right 06-26-14   benign per patient   Social History   Socioeconomic History  . Marital status: Married    Spouse name: Not on file  . Number of children: 1  . Years of education: 67  . Highest education level: Not on file  Social Needs  . Financial resource strain: Not on file  . Food insecurity - worry: Not on file  . Food insecurity - inability: Not on file  . Transportation needs - medical: Not on file  . Transportation needs - non-medical: Not on file  Occupational History  . Occupation: Geologist, engineering  Tobacco Use  . Smoking status: Never Smoker  . Smokeless tobacco: Never Used  Substance and Sexual Activity  . Alcohol use: Yes    Alcohol/week: 0.0 oz    Comment: On occasion   . Drug use: No  . Sexual activity: Yes    Partners: Male    Birth control/protection: Condom  Other Topics Concern  . Not on file  Social History Narrative   Born and raised in [place].    Currently resides in a house with her child. No pets. Fun: Go to the movies.    Denies religious beliefs effecting health care.    Allergies  Allergen Reactions  . Topamax [Topiramate]     Pain in legs, SOB, and almost passed out   Family History  Problem Relation Age of Onset  . Diabetes Mother   . Hypertension Mother   .  Colon cancer Mother 6  . Breast cancer Mother   . Prostate cancer Father   . Stomach cancer Neg Hx   . Esophageal cancer Neg Hx      Past medical history, social, surgical and family history all reviewed in electronic medical record.  No pertanent information unless stated regarding to the chief complaint.   Review of Systems:Review of systems updated and as accurate as of 12/23/16  No headache, visual changes, nausea, vomiting, diarrhea, constipation, dizziness, abdominal pain, skin rash, fevers, chills, night sweats, weight loss, swollen lymph nodes, body aches, joint swelling, muscle aches, chest pain, shortness of breath, mood changes.   Objective  There were no vitals taken for this visit. Systems examined below as of 12/23/16   General: No apparent distress alert and oriented x3 mood and affect normal, dressed appropriately.  HEENT: Pupils equal, extraocular movements intact  Respiratory: Patient's speak in full sentences and does not appear short of breath  Cardiovascular: No lower extremity edema, non tender, no erythema  Skin: Warm dry intact with no signs of infection or rash on extremities or on axial skeleton.  Abdomen: Soft nontender  Neuro: Cranial nerves II through XII are intact, neurovascularly intact in all  extremities with 2+ DTRs and 2+ pulses.  Lymph: No lymphadenopathy of posterior or anterior cervical chain or axillae bilaterally.  Gait normal with good balance and coordination.  MSK:  Non tender with full range of motion and good stability and symmetric strength and tone of shoulders, elbows, wrist, hip, knee and ankles bilaterally.  Back Exam:  Inspection: Unremarkable  Motion: Flexion 45 deg, Extension 25 deg, Side Bending to 35 deg bilaterally,  Rotation to 30 deg bilaterally  SLR laying: Negative  XSLR laying: Negative  Palpable tenderness: Pain still surrounding the right sacroiliac joint and the paraspinal musculature of the right side of the  spine. FABER: The right. Sensory change: Gross sensation intact to all lumbar and sacral dermatomes.  Reflexes: 2+ at both patellar tendons, 2+ at achilles tendons, Babinski's downgoing.  Strength at foot  Plantar-flexion: 5/5 Dorsi-flexion: 5/5 Eversion: 5/5 Inversion: 5/5  Leg strength  Quad: 5/5 Hamstring: 5/5 Hip flexor: 5/5 Hip abductors: 5/5  Gait unremarkable.  Osteopathic findings C2 flexed rotated and side bent right C4 flexed rotated and side bent left C6 flexed rotated and side bent left T3 extended rotated and side bent right inhaled third rib T9 extended rotated and side bent left L2 flexed rotated and side bent right L4 flexed rotated and side bent left Sacrum right on right    Impression and Recommendations:     This case required medical decision making of moderate complexity.      Note: This dictation was prepared with Dragon dictation along with smaller phrase technology. Any transcriptional errors that result from this process are unintentional.

## 2016-12-30 ENCOUNTER — Ambulatory Visit (HOSPITAL_COMMUNITY)
Admission: RE | Admit: 2016-12-30 | Discharge: 2016-12-30 | Disposition: A | Discharge status: Home / Self Care | Payer: Federal, State, Local not specified - PPO | Source: Ambulatory Visit | Attending: Cardiology | Admitting: Cardiology

## 2016-12-30 DIAGNOSIS — G43409 Hemiplegic migraine, not intractable, without status migrainosus: Secondary | ICD-10-CM | POA: Diagnosis not present

## 2016-12-30 DIAGNOSIS — G43109 Migraine with aura, not intractable, without status migrainosus: Secondary | ICD-10-CM

## 2016-12-30 DIAGNOSIS — I6523 Occlusion and stenosis of bilateral carotid arteries: Secondary | ICD-10-CM | POA: Diagnosis not present

## 2017-01-06 ENCOUNTER — Telehealth: Payer: Self-pay

## 2017-01-06 NOTE — Telephone Encounter (Signed)
Called and LM on VM for callback about U/S results

## 2017-01-06 NOTE — Telephone Encounter (Signed)
-----   Message from Pieter Partridge, DO sent at 01/05/2017  6:24 AM EST ----- Arteries in the neck look ok

## 2017-01-11 ENCOUNTER — Telehealth: Payer: Self-pay

## 2017-01-11 NOTE — Telephone Encounter (Signed)
-----   Message from Pieter Partridge, DO sent at 01/05/2017  6:24 AM EST ----- Arteries in the neck look ok

## 2017-01-11 NOTE — Telephone Encounter (Signed)
Left 2nd message requesting Pt to rtrn my call for U/S results

## 2017-01-12 ENCOUNTER — Telehealth: Payer: Self-pay | Admitting: Neurology

## 2017-01-12 NOTE — Telephone Encounter (Signed)
LM advising Pt of U/S results

## 2017-01-12 NOTE — Telephone Encounter (Signed)
Marca returned your call. Please Call. Thanks

## 2017-02-08 ENCOUNTER — Ambulatory Visit: Payer: Federal, State, Local not specified - PPO | Admitting: Family Medicine

## 2017-02-08 ENCOUNTER — Encounter: Payer: Self-pay | Admitting: Family Medicine

## 2017-02-08 VITALS — BP 120/72 | HR 83 | Ht 67.0 in | Wt 153.0 lb

## 2017-02-08 DIAGNOSIS — M940 Chondrocostal junction syndrome [Tietze]: Secondary | ICD-10-CM

## 2017-02-08 DIAGNOSIS — M999 Biomechanical lesion, unspecified: Secondary | ICD-10-CM

## 2017-02-08 NOTE — Assessment & Plan Note (Signed)
.    Discussed icing regimen.  Patient has been making some progress.  As long as patient is well she will continue conservative therapy and follow-up in 6-week intervals.  Does do well with the gabapentin at night and the anti-inflammatories

## 2017-02-08 NOTE — Progress Notes (Signed)
[name] [address] [place], [place] [address] Phone: [phone] Subjective:       CC: Neck and back pain  [id number]  Christina [name] is a 47 y.o. female coming in with complaint of neck and back pain.  Patient did have more of a dull, throbbing.  Patient has done very well.  Feels like she is making progress.  Has had further workup previously and seems to be responding well to conservative therapy at this time.     Past Medical History:  Diagnosis Date  . Hypercholesteremia   . Hyperthyroidism   . Migraines    with asthma  . Slipping rib syndrome    Past Surgical History:  Procedure Laterality Date  . BREAST BIOPSY Right 06-26-14   benign per patient   Social History   Socioeconomic History  . Marital status: Married    Spouse name: Not on file  . Number of children: 1  . Years of education: 38  . Highest education level: Not on file  Social Needs  . Financial resource strain: Not on file  . Food insecurity - worry: Not on file  . Food insecurity - inability: Not on file  . Transportation needs - medical: Not on file  . Transportation needs - non-medical: Not on file  Occupational History  . Occupation: Geologist, engineering  Tobacco Use  . Smoking status: Never Smoker  . Smokeless tobacco: Never Used  Substance and Sexual Activity  . Alcohol use: Yes    Alcohol/week: 0.0 oz    Comment: On occasion   . Drug use: No  . Sexual activity: Yes    Partners: Male    Birth control/protection: Condom  Other Topics Concern  . Not on file  Social History Narrative   Born and raised in [place].    Currently resides in a house with her child. No pets. Fun: Go to the movies.    Denies religious beliefs effecting health care.    Allergies  Allergen Reactions  . Topamax [Topiramate]     Pain in legs, SOB, and almost passed out   Family History  Problem Relation Age of Onset  . Diabetes Mother   . Hypertension Mother   . Colon  cancer Mother 24  . Breast cancer Mother   . Prostate cancer Father   . Stomach cancer Neg Hx   . Esophageal cancer Neg Hx      Past medical history, social, surgical and family history all reviewed in electronic medical record.  No pertanent information unless stated regarding to the chief complaint.   Review of Systems:Review of systems updated and as accurate as of 02/08/17  No headache, visual changes, nausea, vomiting, diarrhea, constipation, dizziness, abdominal pain, skin rash, fevers, chills, night sweats, weight loss, swollen lymph nodes, body aches, joint swelling, chest pain, shortness of breath, mood changes.  Positive muscle aches  Objective  Blood pressure 120/72, height 5\' 7"  (1.702 m), weight 153 lb (69.4 kg). Systems examined below as of 02/08/17   General: No apparent distress alert and oriented x3 mood and affect normal, dressed appropriately.  HEENT: Pupils equal, extraocular movements intact  Respiratory: Patient's speak in full sentences and does not appear short of breath  Cardiovascular: No lower extremity edema, non tender, no erythema  Skin: Warm dry intact with no signs of infection or rash on extremities or on axial skeleton.  Abdomen: Soft nontender  Neuro: Cranial nerves II through XII are intact, neurovascularly  intact in all extremities with 2+ DTRs and 2+ pulses.  Lymph: No lymphadenopathy of posterior or anterior cervical chain or axillae bilaterally.  Gait normal with good balance and coordination.  MSK:  Non tender with full range of motion and good stability and symmetric strength and tone of shoulders, elbows, wrist, hip, knee and ankles bilaterally.   Neck: Inspection mild loss of lordosis. No palpable stepoffs. Negative Spurling's maneuver. Full neck range of motion Grip strength and sensation normal in bilateral hands Strength good C4 to T1 distribution No sensory change to C4 to T1 Negative Hoffman sign bilaterally Reflexes  normal Tightness in the left trapezius area.  Back exam shows the patient still has a positive Corky Sox on the right side.  Negative straight leg test.  Mild tightness of the lumbar spine.  Osteopathic findings C2 flexed rotated and side bent right C7 flexed rotated and side bent left T3 extended rotated and side bent right inhaled third rib T6 extended rotated and side bent right with inhaled rib L2 flexed rotated and side bent right Sacrum right on right     Impression and Recommendations:     This case required medical decision making of moderate complexity.      Note: This dictation was prepared with Dragon dictation along with smaller phrase technology. Any transcriptional errors that result from this process are unintentional.

## 2017-02-08 NOTE — Patient Instructions (Signed)
Good to see you  You are doing great  See me again in 6 week

## 2017-02-08 NOTE — Assessment & Plan Note (Signed)
Decision today to treat with OMT was based on Physical Exam  After verbal consent patient was treated with HVLA, ME, FPR techniques in cervical, thoracic, rib, lumbar and sacral areas  Patient tolerated the procedure well with improvement in symptoms  Patient given exercises, stretches and lifestyle modifications  See medications in patient instructions if given  Patient will follow up in 6-8 weeks 

## 2017-02-16 ENCOUNTER — Ambulatory Visit: Payer: Federal, State, Local not specified - PPO | Admitting: Neurology

## 2017-02-16 ENCOUNTER — Encounter: Payer: Self-pay | Admitting: Neurology

## 2017-02-16 VITALS — BP 102/74 | HR 77 | Ht 67.0 in | Wt 154.6 lb

## 2017-02-16 DIAGNOSIS — G43109 Migraine with aura, not intractable, without status migrainosus: Secondary | ICD-10-CM

## 2017-02-16 DIAGNOSIS — G43409 Hemiplegic migraine, not intractable, without status migrainosus: Secondary | ICD-10-CM | POA: Diagnosis not present

## 2017-02-16 NOTE — Patient Instructions (Signed)
Follow-up in one year.

## 2017-02-16 NOTE — Progress Notes (Signed)
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  Sometimes has strange sensation in left arm and leg without headache.  Lasts 30 to 60 minutes and may occur 2 times a week.  It does not bother her.  No weakness.  Separately, she does have lumbar radicular symptoms down the lateral left leg.  MRI of lumbar spine is negative.  She really hasn't had any migraine headaches.  Carotid ultrasound showed no hemodynamically significant ICA stenosis.   HISTORY: On 03/10/14, she developed sudden onset of vision loss in the left eye.  Over the next 30 minutes, she developed clumsiness and heaviness of her right arm and hand.  There was no speech or language disturbance.  This lasted a couple of hours.  Several hours later, she developed a severe 8-9/10 nonthrobbing left sided headache.  It was associated with nausea, photophobia and phonophobia.  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  MRI of brain performed on 03/11/14 was normal.  She was advised to start ASA 81mg  daily.  She received a cocktail, which eased the pain.  The headache persisted without aura symptoms at an intensity of 5/10.  Since it did not completely resolve, she returned to the ED about a week later.  She received an Imitrex injection which caused the left vision loss aura, which persisted off an on for a few days.  The headache persisted for 2 weeks before it completely resolved.  She did not have the focal loss of fine-motor skills in the right hand.     She had other episodes presenting with left sided weakness as well.  She has had left sided symptoms  involving the arm and leg, presenting as constant feeling of weakness, tingling in the wrist or ankle, and generalized aches.  Symptoms fluctuate during the day. She denies neck pain or pain shooting down the arm or leg.  She also described an episode where she heard crackling in her ears, followed by flashing lights lasting 10 seconds.     She has had occasional recurrent episodes of headache without the visual aura and focal weakness.  It is bi-frontal and throbbing, about 5/10.  It is associated with photophobia and phonophobia, but not nausea.  She will take naproxen 500mg  and cup of coffee which eases the pain.  It will last a couple of days.  Triggers:  Nuts, lemons, dried fruit, pizza.   Prior medications:  topiramate (dizziness)   She has no prior history of migraine.  Her father had migraines.  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  However, she had subsequent ED visits where she presented with left sided weakness, so I felt her symptoms were not related to the left ICA and vascular consult was cancelled.     Repeat carotid doppler from 02/19/15 showed less than 50% bilateral ICA stenosis.   Due to numbness and tingling, labs were ordered.  B12 was 226 and  methylmalonic acid level was 1.2.  She was advised to start B12 1038mcg daily.  She also has been taking B6 and vitamin D.  Paresthesias have improved.    PAST MEDICAL HISTORY: Past Medical History:  Diagnosis Date  . Hypercholesteremia   . Hyperthyroidism   . Migraines    with asthma  . Slipping rib syndrome     MEDICATIONS: Current Outpatient Medications on File Prior to Visit  Medication Sig Dispense Refill  . aspirin 81 MG tablet Take 81 mg by mouth daily.    Christina [name] (EAR DROPS OT) Place in ear(s) as  needed.     . cholecalciferol (VITAMIN D) 1000 units tablet Take 1,000 Units by mouth daily.    Marland Kitchen gabapentin (NEURONTIN) 100 MG capsule Take 1 capsule (100 mg total) by mouth at bedtime. 30 capsule 0  . Ibuprofen-Famotidine 800-26.6 MG TABS Take 1 tablet by mouth 3 (three) times daily as needed. 90 tablet 1  . Iron TABS Take by mouth.    . methimazole (TAPAZOLE) 5 MG tablet Take 1 tablet (5 mg total) by mouth 3 (three) times a week. 13 tablet 5  . omeprazole (PRILOSEC) 40 MG capsule Take 1 capsule (40 mg total) by mouth daily. 30 capsule 11  . Pyridoxine HCl (B-6) 100 MG TABS Take by mouth.    . vitamin B-12 (CYANOCOBALAMIN) 1000 MCG tablet Take 1,000 mcg by mouth daily.    . vitamin C (ASCORBIC ACID) 500 MG tablet Take 500 mg by mouth daily.     Current Facility-Administered Medications on File Prior to Visit  Medication Dose Route Frequency Provider Last Rate Last Dose  . 0.9 %  sodium chloride infusion  500 mL Intravenous Continuous Irene Shipper, MD        ALLERGIES: Allergies  Allergen Reactions  . Topamax [Topiramate]     Pain in legs, SOB, and almost passed out    FAMILY HISTORY: Family History  Problem Relation Age of Onset  . Diabetes Mother   . Hypertension Mother   . Colon cancer Mother 25  . Breast cancer Mother   . Prostate cancer Father   . Stomach cancer Neg Hx   . Esophageal cancer Neg Hx     SOCIAL HISTORY: Social History   Socioeconomic History  . Marital status: Married    Spouse name: Not on file  . Number of children: 1  . Years of education: 37  . Highest education level: Not on file  Social Needs  . Financial resource strain: Not on file  . Food insecurity - worry: Not on file  . Food insecurity - inability: Not on file  . Transportation needs - medical: Not on file  . Transportation needs - non-medical: Not on file  Occupational History  . Occupation: Geologist, engineering  Tobacco Use  . Smoking status: Never Smoker  . Smokeless tobacco: Never  Used  Substance and Sexual Activity  . Alcohol use: Yes    Alcohol/week: 0.0 oz    Comment: On occasion   . Drug use: No  . Sexual activity: Yes    Partners: Male    Birth control/protection: Condom  Other Topics Concern  . Not on file  Social History Narrative   Born and raised in [place].    Currently resides in a house with her child. No pets. Fun: Go to the movies.    Denies religious beliefs effecting health care.     REVIEW OF SYSTEMS: Constitutional: No fevers, chills, or  sweats, no generalized fatigue, change in appetite Eyes: No visual changes, double vision, eye pain Ear, nose and throat: No hearing loss, ear pain, nasal congestion, sore throat Cardiovascular: No chest pain, palpitations Respiratory:  No shortness of breath at rest or with exertion, wheezes GastrointestinaI: No nausea, vomiting, diarrhea, abdominal pain, fecal incontinence Genitourinary:  No dysuria, urinary retention or frequency Musculoskeletal:  No neck pain, back pain Integumentary: No rash, pruritus, skin lesions Neurological: as above Psychiatric: No depression, insomnia, anxiety Endocrine: No palpitations, fatigue, diaphoresis, mood swings, change in appetite, change in weight, increased thirst Hematologic/Lymphatic:  No purpura, petechiae. Allergic/Immunologic: no itchy/runny eyes, nasal congestion, recent allergic reactions, rashes  PHYSICAL EXAM: Vitals:   02/16/17 1402  BP: 102/74  Pulse: 77  SpO2: 98%   General: No acute distress.  Patient appears well-groomed.  normal body habitus. Head:  Normocephalic/atraumatic Eyes:  Fundi examined but not visualized Neck: supple, no paraspinal tenderness, full range of motion Heart:  Regular rate and rhythm Lungs:  Clear to auscultation bilaterally Back: No paraspinal tenderness Neurological Exam: alert and oriented to person, place, and time. Attention span and concentration intact, recent and remote memory intact, fund of knowledge intact.   Speech fluent and not dysarthric, language intact.  CN II-XII intact. Bulk and tone normal, muscle strength 5/5 throughout.  Sensation to temperature and vibration intact.  Deep tendon reflexes 2+ throughout, toes downgoing.  Finger to nose and heel to shin testing intact.  Gait normal, Romberg negative.  IMPRESSION: Hemiplegic migraine Migraine with visual aura  PLAN: Stable.  No recommendations at this time. Follow up in one year  15 minutes spent face to face with patient, over 50% spent discussing management and diagnosis.  Metta Clines, DO  CC:  Mauricio Po, FNP

## 2017-03-08 ENCOUNTER — Other Ambulatory Visit (INDEPENDENT_AMBULATORY_CARE_PROVIDER_SITE_OTHER): Payer: Federal, State, Local not specified - PPO

## 2017-03-08 ENCOUNTER — Ambulatory Visit: Payer: Federal, State, Local not specified - PPO | Admitting: Nurse Practitioner

## 2017-03-08 ENCOUNTER — Encounter: Payer: Self-pay | Admitting: Nurse Practitioner

## 2017-03-08 VITALS — BP 118/80 | HR 83 | Temp 98.6°F | Resp 16 | Ht 67.0 in | Wt 153.0 lb

## 2017-03-08 DIAGNOSIS — E7800 Pure hypercholesterolemia, unspecified: Secondary | ICD-10-CM | POA: Diagnosis not present

## 2017-03-08 DIAGNOSIS — R591 Generalized enlarged lymph nodes: Secondary | ICD-10-CM | POA: Diagnosis not present

## 2017-03-08 LAB — CBC WITH DIFFERENTIAL/PLATELET
BASOS ABS: 0 10*3/uL (ref 0.0–0.1)
BASOS PCT: 0.9 % (ref 0.0–3.0)
EOS ABS: 0.1 10*3/uL (ref 0.0–0.7)
Eosinophils Relative: 1 % (ref 0.0–5.0)
HEMATOCRIT: 43.2 % (ref 36.0–46.0)
HEMOGLOBIN: 14.1 g/dL (ref 12.0–15.0)
LYMPHS PCT: 26.6 % (ref 12.0–46.0)
Lymphs Abs: 1.4 10*3/uL (ref 0.7–4.0)
MCHC: 32.6 g/dL (ref 30.0–36.0)
MCV: 86.4 fl (ref 78.0–100.0)
MONO ABS: 0.6 10*3/uL (ref 0.1–1.0)
Monocytes Relative: 11.2 % (ref 3.0–12.0)
Neutro Abs: 3.1 10*3/uL (ref 1.4–7.7)
Neutrophils Relative %: 60.3 % (ref 43.0–77.0)
Platelets: 213 10*3/uL (ref 150.0–400.0)
RBC: 5 Mil/uL (ref 3.87–5.11)
RDW: 13.7 % (ref 11.5–15.5)
WBC: 5.2 10*3/uL (ref 4.0–10.5)

## 2017-03-08 LAB — LIPID PANEL
CHOL/HDL RATIO: 4
CHOLESTEROL: 136 mg/dL (ref 0–200)
HDL: 33.8 mg/dL — AB (ref >39.00)
LDL CALC: 93 mg/dL (ref 0–99)
NonHDL: 102.59
TRIGLYCERIDES: 49 mg/dL (ref 0.0–149.0)
VLDL: 9.8 mg/dL (ref 0.0–40.0)

## 2017-03-08 MED ORDER — DOXYCYCLINE HYCLATE 100 MG PO TABS: 100.0000 mg | ORAL_TABLET | Freq: Two times a day (BID) | ORAL | 0 refills | Status: DC

## 2017-03-08 NOTE — Assessment & Plan Note (Signed)
Discussed the role of healthy diet and exercise in the management of cholesterol levels. Will repeat labs today - Lipid panel; Future

## 2017-03-08 NOTE — Progress Notes (Signed)
Name: Christina [name]. Christina [name]   MRN: [id number]    DOB: 04/20/1970   Date:03/08/2017       Progress Note  Subjective  Chief Complaint  Chief Complaint  Patient presents with  . Establish Care    recheck lipid panel, cyst on neck    HPI [name] is establishing care today. 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 Today she wants to follow up on her cholesterol and discuss a cyst on her neck.  Cholesterol- she is not maintained on any daily medications She was told by her prior provider to improve diet and return in about 6 months for cholesterol follow up due to elevated LDL and low HDL on ;lipid panel in July 2018. She has tried to improve her Diet- Breakfast: bacon, eggs, biscuit, avocado; Lunch: leftovers; Dinner:cooks at home- pork or chicken, rice, pasta, salads, vegetables; Trying to decreased red meat, increase vegetables. Exercise: new job where she walks daily, no other routine exercise  Lab Results  Component Value Date   CHOL 138 08/24/2016   HDL 28.00 (L) 08/24/2016   LDLCALC 100 (H) 08/24/2016   TRIG 53.0 08/24/2016   CHOLHDL 5 08/24/2016   Cyst- This is not a new problem. She describes the cyst as a palpable area to her right posterior neck. She first noticed this about one year ago when she was brushing her hair. The cyst has remained for a year but over the past week it has become painful, inflamed and swollen. She denies fevers, drainage, recent illness. She has been applying warm compresses with no improvement. She overall feels well. 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  Patient Active Problem List   Diagnosis Date Noted  . Left lumbar radiculopathy 09/07/2016  . SI (sacroiliac) joint dysfunction 05/27/2016  . Nonallopathic lesion of sacral region 05/27/2016  . Whiplash injuries, initial  encounter 04/14/2016  . Low back pain 12/31/2015  . 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 Nonallopathic lesion of thoracic region 07/12/2014  . Nonallopathic lesion-rib cage 07/12/2014  . Nonallopathic lesion of cervical region 07/12/2014  . Family history of breast cancer 07/10/2014    Class: Family History of  . Rib pain on right side 06/21/2014  . Routine general medical examination at a health care facility 05/27/2014  . Migraine 04/03/2014  . Hyperthyroidism 12/26/2013  . Vitamin D deficiency 04/26/2013    Past Surgical History:  Procedure Laterality Date  . BREAST BIOPSY Right 06-26-14   benign per patient    Family History  Problem Relation Age of Onset  . Diabetes Mother   . Hypertension Mother   . Colon cancer Mother 1  . Breast cancer Mother   . Prostate cancer Father   . Stomach cancer Neg Hx   . Esophageal cancer Neg Hx     Social History   Socioeconomic History  . Marital status: Married    Spouse name: Not on file  . Number of children: 1  . Years of education: 74  . Highest education level: Not on file  Social Needs  . Financial resource strain: Not on file  . Food insecurity - worry: Not on file  . Food insecurity - inability: Not on file  . Transportation needs - medical: Not on file  . Transportation needs - non-medical: Not on file  Occupational History  . Occupation: Geologist, engineering  Tobacco  Use  . Smoking status: Never Smoker  . Smokeless tobacco: Never Used  Substance and Sexual Activity  . Alcohol use: Yes    Alcohol/week: 0.0 oz    Comment: On occasion   . Drug use: No  . Sexual activity: Yes    Partners: Male    Birth control/protection: Condom  Other Topics Concern  . Not on file  Social History Narrative   Born and raised in [place].    Currently resides in a house with her child. No pets. Fun: Go to the movies.    Denies religious beliefs effecting health care.      Current Outpatient Medications:  .  aspirin 81 MG tablet,  Take 81 mg by mouth daily., Disp: , Rfl:  .  Carbamide Peroxide (EAR DROPS OT), Place in ear(s) as needed. , Disp: , Rfl:  .  cholecalciferol (VITAMIN D) 1000 units tablet, Take 1,000 Units by mouth daily., Disp: , Rfl:  .  gabapentin (NEURONTIN) 100 MG capsule, Take 1 capsule (100 mg total) by mouth at bedtime., Disp: 30 capsule, Rfl: 0 .  Ibuprofen-Famotidine 800-26.6 MG TABS, Take 1 tablet by mouth 3 (three) times daily as needed., Disp: 90 tablet, Rfl: 1 .  Iron TABS, Take by mouth., Disp: , Rfl:  .  methimazole (TAPAZOLE) 5 MG tablet, Take 1 tablet (5 mg total) by mouth 3 (three) times a week., Disp: 13 tablet, Rfl: 5 .  omeprazole (PRILOSEC) 40 MG capsule, Take 1 capsule (40 mg total) by mouth daily., Disp: 30 capsule, Rfl: 11 .  Pyridoxine HCl (B-6) 100 MG TABS, Take by mouth., Disp: , Rfl:  .  vitamin B-12 (CYANOCOBALAMIN) 1000 MCG tablet, Take 1,000 mcg by mouth daily., Disp: , Rfl:  .  vitamin C (ASCORBIC ACID) 500 MG tablet, Take 500 mg by mouth daily., Disp: , Rfl:   Current Facility-Administered Medications:  .  0.9 %  sodium chloride infusion, 500 mL, Intravenous, Continuous, Irene Shipper, MD  Allergies  Allergen Reactions  . Topamax [Topiramate]     Pain in legs, SOB, and almost passed out     ROS See HPI  Objective  Vitals:   03/08/17 1053  BP: 118/80  Pulse: 83  Resp: 16  Temp: 98.6 F (37 C)  TempSrc: Oral  SpO2: 98%  Weight: 153 lb (69.4 kg)  Height: 5\' 7"  (1.702 m)   Body mass index is 23.96 kg/m.  Physical Exam Vital signs reviewed. Constitutional: Patient appears well-developed and well-nourished. No distress.  HENT: Head: Normocephalic and atraumatic. Nose: Nose normal. Mouth/Throat: Oropharynx is clear and moist. No oropharyngeal exudate.  Eyes: Conjunctivae and EOMs are normal. No scleral icterus.  Neck: Normal range of motion. Neck supple. Firm tender erythematous mass to right occipital neck , without drainage.  Cardiovascular: Normal rate,  regular rhythm and normal heart sounds.  No murmur heard. No BLE edema. Distal pulses intact. Pulmonary/Chest: Effort normal and breath sounds normal. No respiratory distress. Abdominal: Soft. No distension. Musculoskeletal: Normal range of motion, no joint effusions. No gross deformities Neurological: She is alert and oriented to person, place, and time.  Coordination, balance, strength, speech and gait are normal.  Skin: Skin is warm and dry. No rash noted. Psychiatric: Patient has a normal mood and affect. Behavior is normal. Judgment and thought content normal.  Fall Risk: Fall Risk  02/16/2017 02/12/2015  Falls in the past year? No No   Assessment & Plan RTC in about 6 months for routine follow up, CPE  Lymphadenopathy of head and neck Palpable mass to neck suspicious for lymph node? Will initiate antibiotics for acute infection then consider referral to surgery for biopsy. I discussed this plan with patient as well as return precautions- See AVS for education provided to patient, and she was understanding She was instructed to follow up with me in about 2 weeks after she finishes antibiotics for further instructions. - doxycycline (VIBRA-TABS) 100 MG tablet; Take 1 tablet (100 mg total) by mouth 2 (two) times daily.  Dispense: 20 tablet; Refill: 0 - CBC with Differential/Platelet; Future

## 2017-03-08 NOTE — Patient Instructions (Addendum)
Please head downstairs for lab work.  Please start doxycycline 100mg  twice daily for 10 days. Please call or send me a mychart message in about 2 weeks to let me know how the area on your neck is doing. We will probably need to send you for a surgery consult for further evaluation of this area, but we need to treat the infection first. You may take over the counter tylenol or ibuprofen for your pain. Do not take ibuprofen with other NSAIDS, and do not take more than 3000 mg of tylenol in a 24 hour period.  Please follow up for fevers, spreading redness or worsening pain.  Please return in about 6 months for routine follow up, we can do your annual physical that day if you are feeling well.  Lymphadenopathy Lymphadenopathy refers to swollen or enlarged lymph glands, also called lymph nodes. Lymph glands are part of your body's defense (immune) system, which protects the body from infections, germs, and diseases. Lymph glands are found in many locations in your body, including the neck, underarm, and groin. Many things can cause lymph glands to become enlarged. When your immune system responds to germs, such as viruses or bacteria, infection-fighting cells and fluid build up. This causes the glands to grow in size. Usually, this is not something to worry about. The swelling and any soreness often go away without treatment. However, swollen lymph glands can also be caused by a number of diseases. Your health care provider may do various tests to help determine the cause. If the cause of your swollen lymph glands cannot be found, it is important to monitor your condition to make sure the swelling goes away. Follow these instructions at home: Watch your condition for any changes. The following actions may help to lessen any discomfort you are feeling:  Get plenty of rest.  Take medicines only as directed by your health care provider. Your health care provider may recommend over-the-counter medicines for  pain.  Apply moist heat compresses to the site of swollen lymph nodes as directed by your health care provider. This can help reduce any pain.  Check your lymph nodes daily for any changes.  Keep all follow-up visits as directed by your health care provider. This is important.  Contact a health care provider if:  Your lymph nodes are still swollen after 2 weeks.  Your swelling increases or spreads to other areas.  Your lymph nodes are hard, seem fixed to the skin, or are growing rapidly.  Your skin over the lymph nodes is red and inflamed.  You have a fever.  You have chills.  You have fatigue.  You develop a sore throat.  You have abdominal pain.  You have weight loss.  You have night sweats. Get help right away if:  You notice fluid leaking from the area of the enlarged lymph node.  You have severe pain in any area of your body.  You have chest pain.  You have shortness of breath. This information is not intended to replace advice given to you by your health care provider. Make sure you discuss any questions you have with your health care provider. Document Released: 10/21/2007 Document Revised: 06/19/2015 Document Reviewed: 08/16/2013 Elsevier Interactive Patient Education  [employer].

## 2017-03-13 ENCOUNTER — Encounter (HOSPITAL_COMMUNITY): Payer: Self-pay | Admitting: Emergency Medicine

## 2017-03-13 ENCOUNTER — Emergency Department (HOSPITAL_COMMUNITY)
Admission: EM | Admit: 2017-03-13 | Discharge: 2017-03-13 | Disposition: A | Discharge status: Home / Self Care | Payer: Federal, State, Local not specified - PPO | Attending: Emergency Medicine | Admitting: Emergency Medicine

## 2017-03-13 ENCOUNTER — Other Ambulatory Visit: Payer: Self-pay

## 2017-03-13 DIAGNOSIS — M542 Cervicalgia: Secondary | ICD-10-CM | POA: Diagnosis not present

## 2017-03-13 DIAGNOSIS — R220 Localized swelling, mass and lump, head: Secondary | ICD-10-CM | POA: Diagnosis not present

## 2017-03-13 DIAGNOSIS — Z7982 Long term (current) use of aspirin: Secondary | ICD-10-CM | POA: Diagnosis not present

## 2017-03-13 DIAGNOSIS — Z79899 Other long term (current) drug therapy: Secondary | ICD-10-CM | POA: Insufficient documentation

## 2017-03-13 DIAGNOSIS — L089 Local infection of the skin and subcutaneous tissue, unspecified: Secondary | ICD-10-CM | POA: Insufficient documentation

## 2017-03-13 DIAGNOSIS — L729 Follicular cyst of the skin and subcutaneous tissue, unspecified: Secondary | ICD-10-CM | POA: Diagnosis not present

## 2017-03-13 DIAGNOSIS — L723 Sebaceous cyst: Secondary | ICD-10-CM | POA: Diagnosis not present

## 2017-03-13 MED ORDER — LIDOCAINE HCL 1 % IJ SOLN
INTRAMUSCULAR | Status: AC
  Administered 2017-03-13: 06:00:00
  Filled 2017-03-13: qty 20

## 2017-03-13 MED ORDER — BACITRACIN ZINC 500 UNIT/GM EX OINT
TOPICAL_OINTMENT | Freq: Once | CUTANEOUS | Status: AC
  Administered 2017-03-13: 07:00:00 via TOPICAL
  Filled 2017-03-13: qty 2.7

## 2017-03-13 NOTE — ED Triage Notes (Signed)
Pt reports having a cyst on posterior neck that has been draining and was started on abx on 03/08/17 and then today felt nauseated after taking abx. Swelling and redness present to posterior neck.

## 2017-03-13 NOTE — Discharge Instructions (Addendum)
It was my pleasure taking care of you today!   Please continue taking all of your antibiotics until finished!    Please call your primary care doctor tomorrow to schedule a follow up appointment.  Return to the emergency department if you develop a fever, the area gets worse, new symptoms develop, any additional concerns.   HOME CARE INSTRUCTIONS  Keep the skin and clothes clean around your abscess.  If the abscess was drained, you will need to use gauze dressing to collect any draining pus. Dressings will typically need to be changed 3 or more times a day.  The infection may spread by skin contact with others. Avoid skin contact as much as possible.  Practice good hygiene. This includes regular hand washing, cover any draining skin lesions, and do not share personal care items.

## 2017-03-13 NOTE — ED Notes (Signed)
Pt woke up and started having nausea and lightheadedness at 02:30 this morning. She also has a cyst on her neck for which she is taking antibiotics; she has 5 days left of antibiotics for that. Denies emesis or diarrhea.

## 2017-03-13 NOTE — ED Provider Notes (Signed)
[facility] DEPT Provider Note   CSN: [id number] Arrival date & time: 03/13/17  0343     History   Chief Complaint Chief Complaint  Patient presents with  . Abscess    HPI Christina [name] is a 47 y.o. female.  The history is provided by the patient and medical records. No language interpreter was used.  Abscess  Associated symptoms: no fever    Christina [name] is a 47 y.o. female  with a PMH as listed below who presents to the Emergency Department complaining of area to the right aspect of her neck which has been progressively swelling.  Patient states that the area has been very small about the size of her fingertip, for about 1 year.  About a week and a half ago, it became swollen, red and painful.  She was seen on 2/12 at her primary care doctor who felt as if this was a lymph node.  She was started on doxycycline for lymphadenopathy which she has taken twice daily as directed with no missed doses.  She feels as if symptoms are worsening despite antibiotic use.  She denies any fever or chills.  There was a very small amount of drainage that had a foul-smelling odor and was a yellow color.  She wiped this off with a Q-tip and has not had noticed any drainage since.  Past Medical History:  Diagnosis Date  . Hypercholesteremia   . Hyperthyroidism   . Migraines    with asthma  . Slipping rib syndrome     Patient Active Problem List   Diagnosis Date Noted  . Hypercholesterolemia 03/08/2017  . Left lumbar radiculopathy 09/07/2016  . SI (sacroiliac) joint dysfunction 05/27/2016  . Nonallopathic lesion of sacral region 05/27/2016  . Whiplash injuries, initial encounter 04/14/2016  . Low back pain 12/31/2015  . 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 Nonallopathic lesion of thoracic region 07/12/2014  . Nonallopathic lesion-rib cage 07/12/2014  . Nonallopathic lesion of cervical region 07/12/2014  . Family history of breast cancer 07/10/2014    Class:  Family History of  . Rib pain on right side 06/21/2014  . Routine general medical examination at a health care facility 05/27/2014  . Migraine 04/03/2014  . Hyperthyroidism 12/26/2013  . Vitamin D deficiency 04/26/2013    Past Surgical History:  Procedure Laterality Date  . BREAST BIOPSY Right 06-26-14   benign per patient    OB History    Gravida Para Term Preterm AB Living   3 1 1   2 1    SAB TAB Ectopic Multiple Live Births   2       1       Home Medications    Prior to Admission medications   Medication Sig Start Date End Date Taking? Authorizing Provider  aspirin 81 MG tablet Take 81 mg by mouth daily.    [provider]  Carbamide Peroxide (EAR DROPS OT) Place in ear(s) as needed.     [provider]  cholecalciferol (VITAMIN D) 1000 units tablet Take 1,000 Units by mouth daily.    [provider]  doxycycline (VIBRA-TABS) 100 MG tablet Take 1 tablet (100 mg total) by mouth 2 (two) times daily. 03/08/17   Lance Sell, NP  gabapentin (NEURONTIN) 100 MG capsule Take 1 capsule (100 mg total) by mouth at bedtime. 08/20/16   Golden Circle, FNP  Ibuprofen-Famotidine 800-26.6 MG TABS Take 1 tablet by mouth 3 (three) times daily as needed.  04/09/16   Golden Circle, FNP  Iron TABS Take by mouth.    [provider]  methimazole (TAPAZOLE) 5 MG tablet Take 1 tablet (5 mg total) by mouth 3 (three) times a week. 11/19/16   Renato Shin, MD  omeprazole (PRILOSEC) 40 MG capsule Take 1 capsule (40 mg total) by mouth daily. 11/23/16   Irene Shipper, MD  Pyridoxine HCl (B-6) 100 MG TABS Take by mouth.    [provider]  vitamin B-12 (CYANOCOBALAMIN) 1000 MCG tablet Take 1,000 mcg by mouth daily.    [provider]  vitamin C (ASCORBIC ACID) 500 MG tablet Take 500 mg by mouth daily.    [provider]    Family History Family History  Problem Relation Age of Onset  . Diabetes Mother   . Hypertension Mother     . Colon cancer Mother 96  . Breast cancer Mother   . Prostate cancer Father   . Stomach cancer Neg Hx   . Esophageal cancer Neg Hx     Social History Social History   Tobacco Use  . Smoking status: Never Smoker  . Smokeless tobacco: Never Used  Substance Use Topics  . Alcohol use: Yes    Alcohol/week: 0.0 oz    Comment: On occasion   . Drug use: No     Allergies   Topamax [topiramate]   Review of Systems Review of Systems  Constitutional: Negative for chills and fever.  Musculoskeletal: Positive for neck pain. Negative for neck stiffness.  Skin: Positive for color change and wound.  All other systems reviewed and are negative.    Physical Exam Updated Vital Signs BP 117/89 (BP Location: Left Arm)   Pulse 90   Temp 98.7 F (37.1 C) (Oral)   Resp 16   Ht 5\' 7"  (1.702 m)   Wt 68 kg (150 lb)   LMP 02/22/2017   SpO2 96%   BMI 23.49 kg/m   Physical Exam  Constitutional: She is oriented to person, place, and time. She appears well-developed and well-nourished. No distress.  HENT:  Head: Normocephalic and atraumatic.  Neck:    Cardiovascular: Normal rate, regular rhythm and normal heart sounds.  No murmur heard. Pulmonary/Chest: Effort normal and breath sounds normal. No respiratory distress.  Neurological: She is alert and oriented to person, place, and time.  Skin: Skin is warm and dry.  Psychiatric: She has a normal mood and affect.  Nursing note and vitals reviewed.    ED Treatments / Results  Labs (all labs ordered are listed, but only abnormal results are displayed) Labs Reviewed - No data to display  EKG  EKG Interpretation None       Radiology No results found.  Procedures .Marland KitchenIncision and Drainage Date/Time: 03/13/2017 6:48 AM Performed by: Ward, Ozella Almond, PA-C Authorized by: Ward, Ozella Almond, PA-C   Consent:    Consent obtained:  Verbal   Consent given by:  Patient   Risks discussed:  Bleeding, incomplete drainage, pain  and infection Location:    Type:  Abscess   Location:  Neck   Neck location: Right lateral. Pre-procedure details:    Skin preparation:  Betadine Anesthesia (see MAR for exact dosages):    Anesthesia method:  Local infiltration   Local anesthetic:  Lidocaine 1% w/o epi (33ml) Procedure type:    Complexity:  Complex Procedure details:    Incision types:  Single straight   Scalpel blade:  11   Wound management:  Probed and  deloculated   Drainage:  Purulent   Drainage amount:  Copious   Wound treatment:  Wound left open   Packing materials:  None Post-procedure details:    Patient tolerance of procedure:  Tolerated well, no immediate complications   (including critical care time)  Medications Ordered in ED Medications  lidocaine (XYLOCAINE) 1 % (with pres) injection (not administered)  bacitracin ointment (not administered)     Initial Impression / Assessment and Plan / ED Course  I have reviewed the triage vital signs and the nursing notes.  Pertinent labs & imaging results that were available during my care of the patient were reviewed by me and considered in my medical decision making (see chart for details).    Jimi D. Tamala [name] is a 47 y.o. female who presents to ED for abscess requiring incision and drainage. I&D performed per procedure note above. Large amount of thick white drainage expressed. Patient tolerated the procedure well. Patient is currently on doxycyline written by PCP for this - will have her continue course until completion. Wound care instructions discussed. Wound check in about 3 days at PCP recommended. Return to ER if concern for spread of infection, increasing pain, fevers or other concerns. All questions answered.   Final Clinical Impressions(s) / ED Diagnoses   Final diagnoses:  Infected cyst of skin    ED Discharge Orders    None       Ward, Ozella Almond, PA-C 36/46/80 3212    Delora Fuel, MD 24/82/50 [phone]

## 2017-03-14 ENCOUNTER — Telehealth: Payer: Self-pay | Admitting: Nurse Practitioner

## 2017-03-14 NOTE — Telephone Encounter (Signed)
Copied from [place]. Topic: Appointment Scheduling - Scheduling Inquiry for Clinic >> Mar 14, 2017 10:51 AM Margot Ables wrote: Reason for CRM: pt went to ER for the cyst/nodule she had - they drained it and advised pt to f/u with PCP - pt scheduled for 03/17/17 - pt asking if she needs to be seen then, before, or after - please call her to notify at [phone]    The ER did not say how soon she needed to be seen. Is this appointment ok?

## 2017-03-15 NOTE — Telephone Encounter (Signed)
LVM letting pt know appointment time is ok.

## 2017-03-17 ENCOUNTER — Encounter: Payer: Self-pay | Admitting: Internal Medicine

## 2017-03-17 ENCOUNTER — Ambulatory Visit: Payer: Federal, State, Local not specified - PPO | Admitting: Internal Medicine

## 2017-03-17 ENCOUNTER — Inpatient Hospital Stay: Payer: Federal, State, Local not specified - PPO | Admitting: Nurse Practitioner

## 2017-03-17 DIAGNOSIS — M542 Cervicalgia: Secondary | ICD-10-CM

## 2017-03-17 NOTE — Progress Notes (Signed)
   Subjective:    Patient ID: Christina [name], female    DOB: May 03, 1970, 47 y.o.   MRN: [id number]  HPI The patient is a 47 YO female coming in for ER follow up (in for I and D of right neck abscess and cyst). She has been taking doxycycline since seeing PCP prior to ER visit. She is feeling much improved. I and D provided immediate relief to pain. She is keeping covered and washing with soapy water. She denies drainage and swelling is much down. Some dark color to skin. No itching.   Review of Systems  Constitutional: Negative.   HENT: Negative.   Eyes: Negative.   Respiratory: Negative for cough, chest tightness and shortness of breath.   Cardiovascular: Negative for chest pain, palpitations and leg swelling.  Gastrointestinal: Negative for abdominal distention, abdominal pain, constipation, diarrhea, nausea and vomiting.  Musculoskeletal: Negative.   Skin: Positive for color change and wound.  Neurological: Negative.   Psychiatric/Behavioral: Negative.       Objective:   Physical Exam  Constitutional: She is oriented to person, place, and time. She appears well-developed and well-nourished.  HENT:  Head: Normocephalic and atraumatic.  Eyes: EOM are normal.  Neck: Normal range of motion.  Right posterior neck with small scabbed wound without fluctuance or drainage expressible, no tenderness to palpation and no cellulitis apparent.   Cardiovascular: Normal rate and regular rhythm.  Pulmonary/Chest: Effort normal and breath sounds normal. No respiratory distress. She has no wheezes. She has no rales.  Abdominal: Soft. Bowel sounds are normal. She exhibits no distension. There is no tenderness. There is no rebound.  Musculoskeletal: She exhibits no edema.  Neurological: She is alert and oriented to person, place, and time. Coordination normal.  Skin: Skin is warm and dry.  Psychiatric: She has a normal mood and affect.   Vitals:   03/17/17 1354  BP: 110/70  Pulse: 78  Temp: 97.7  F (36.5 C)  TempSrc: Oral  SpO2: 98%  Weight: 154 lb (69.9 kg)  Height: 5\' 7"  (1.702 m)      Assessment & Plan:

## 2017-03-17 NOTE — Patient Instructions (Signed)
This is healing well so keep it covered at work.

## 2017-03-18 DIAGNOSIS — M542 Cervicalgia: Secondary | ICD-10-CM | POA: Insufficient documentation

## 2017-03-18 NOTE — Assessment & Plan Note (Signed)
Using otc pain medication with good relief. Pain is overall decreasing. Will finish doxycycline. Appears without infection at this time. Counseled about risk of recurrence of infection and likelihood that the cyst will infect again and need for surgical excision if recurrent.

## 2017-03-22 NOTE — Progress Notes (Signed)
[name] [address] [place], [place] [address] Phone: [phone] Subjective:    I'm seeing this patient by the request  of:    CC: Back pain follow-up  POE:[id number]  Christina [name] is a 47 y.o. female coming in with complaint of back pain.  Patient has had this for quite some time.  Has been doing better over the course the last several days.  Patient feels like the back is a lot looser recently.  Been able to work out more regularly.  Patient was treated recently for a cyst in the neck.       Past Medical History:  Diagnosis Date  . Hypercholesteremia   . Hyperthyroidism   . Migraines    with asthma  . Slipping rib syndrome    Past Surgical History:  Procedure Laterality Date  . BREAST BIOPSY Right 06-26-14   benign per patient   Social History   Socioeconomic History  . Marital status: Married    Spouse name: None  . Number of children: 1  . Years of education: 79  . Highest education level: None  Social Needs  . Financial resource strain: None  . Food insecurity - worry: None  . Food insecurity - inability: None  . Transportation needs - medical: None  . Transportation needs - non-medical: None  Occupational History  . Occupation: Geologist, engineering  Tobacco Use  . Smoking status: Never Smoker  . Smokeless tobacco: Never Used  Substance and Sexual Activity  . Alcohol use: Yes    Alcohol/week: 0.0 oz    Comment: On occasion   . Drug use: No  . Sexual activity: Yes    Partners: Male    Birth control/protection: Condom  Other Topics Concern  . None  Social History Narrative   Born and raised in [place].    Currently resides in a house with her child. No pets. Fun: Go to the movies.    Denies religious beliefs effecting health care.    Allergies  Allergen Reactions  . Topamax [Topiramate]     Pain in legs, SOB, and almost passed out   Family History  Problem Relation Age of Onset  . Diabetes Mother   .  Hypertension Mother   . Colon cancer Mother 87  . Breast cancer Mother   . Prostate cancer Father   . Stomach cancer Neg Hx   . Esophageal cancer Neg Hx      Past medical history, social, surgical and family history all reviewed in electronic medical record.  No pertanent information unless stated regarding to the chief complaint.   Review of Systems:Review of systems updated and as accurate as of 03/23/17  No headache, visual changes, nausea, vomiting, diarrhea, constipation, dizziness, abdominal pain, skin rash, fevers, chills, night sweats, weight loss, swollen lymph nodes, body aches, joint swelling, muscle aches, chest pain, shortness of breath, mood changes.   Objective  Blood pressure 114/70, pulse 68, height 5\' 7"  (1.702 m), weight 153 lb (69.4 kg), last menstrual period 02/22/2017, SpO2 98 %. Systems examined below as of 03/23/17   General: No apparent distress alert and oriented x3 mood and affect normal, dressed appropriately.  HEENT: Pupils equal, extraocular movements intact  Respiratory: Patient's speak in full sentences and does not appear short of breath  Cardiovascular: No lower extremity edema, non tender, no erythema  Skin: Warm dry intact with no signs of infection or rash on extremities or on axial skeleton.  Abdomen: Soft  nontender  Neuro: Cranial nerves II through XII are intact, neurovascularly intact in all extremities with 2+ DTRs and 2+ pulses.  Lymph: No lymphadenopathy of posterior or anterior cervical chain or axillae bilaterally.  Gait normal with good balance and coordination.  MSK:  Non tender with full range of motion and good stability and symmetric strength and tone of shoulders, elbows, wrist, hip, knee and ankles bilaterally.   Back Exam:  Inspection: Loss of lordosis Motion: Flexion 30 deg, Extension 25 deg, Side Bending to 35 deg bilaterally,  Rotation to 35 deg bilaterally  SLR laying: Negative  XSLR laying: Negative  Palpable tenderness:  Tender to palpation the paraspinal musculature lumbar spine right greater than left. FABER: Positive Faber bilaterally. Sensory change: Gross sensation intact to all lumbar and sacral dermatomes.  Reflexes: 2+ at both patellar tendons, 2+ at achilles tendons, Babinski's downgoing.  Strength at foot  Plantar-flexion: 5/5 Dorsi-flexion: 5/5 Eversion: 5/5 Inversion: 5/5  Leg strength  Quad: 5/5 Hamstring: 5/5 Hip flexor: 5/5 Hip abductors: 4/5 and symmetric Gait unremarkable.  Osteopathic findings CervicalC2 flexed rotated and side bent right C4 flexed rotated and side bent left C6 flexed rotated and side bent left T3 extended rotated and side bent right inhaled third rib T9 extended rotated and side bent left L2 flexed rotated and side bent right Sacrum right on right     Impression and Recommendations:     This case required medical decision making of moderate complexity.      Note: This dictation was prepared with Dragon dictation along with smaller phrase technology. Any transcriptional errors that result from this process are unintentional.

## 2017-03-23 ENCOUNTER — Ambulatory Visit (INDEPENDENT_AMBULATORY_CARE_PROVIDER_SITE_OTHER): Payer: Federal, State, Local not specified - PPO | Admitting: Family Medicine

## 2017-03-23 ENCOUNTER — Encounter: Payer: Self-pay | Admitting: Family Medicine

## 2017-03-23 VITALS — BP 114/70 | HR 68 | Ht 67.0 in | Wt 153.0 lb

## 2017-03-23 DIAGNOSIS — M940 Chondrocostal junction syndrome [Tietze]: Secondary | ICD-10-CM

## 2017-03-23 DIAGNOSIS — M999 Biomechanical lesion, unspecified: Secondary | ICD-10-CM

## 2017-03-23 DIAGNOSIS — R591 Generalized enlarged lymph nodes: Secondary | ICD-10-CM

## 2017-03-23 MED ORDER — DOXYCYCLINE HYCLATE 100 MG PO TABS: 100.0000 mg | ORAL_TABLET | Freq: Two times a day (BID) | ORAL | 0 refills | Status: DC

## 2017-03-23 NOTE — Assessment & Plan Note (Signed)
Decision today to treat with OMT was based on Physical Exam  After verbal consent patient was treated with HVLA, ME, FPR techniques in cervical, thoracic, rib lumbar and sacral areas  Patient tolerated the procedure well with improvement in symptoms  Patient given exercises, stretches and lifestyle modifications  See medications in patient instructions if given  Patient will follow up in 4 weeks 

## 2017-03-23 NOTE — Patient Instructions (Signed)
Good to see you  Doxy another 10 days if it gets bigger Just watch it at work Back is great  Lets say see me again in 5-6 weeks

## 2017-03-23 NOTE — Assessment & Plan Note (Signed)
Slipped rib syndrome.  Patient is doing relatively well.  Has had tightness in the lower back previously but that is significantly improved at this time.  Discussed icing regimen and home exercises.  Follow-up again in 4

## 2017-03-29 ENCOUNTER — Ambulatory Visit: Payer: Federal, State, Local not specified - PPO | Admitting: Endocrinology

## 2017-04-27 ENCOUNTER — Ambulatory Visit (INDEPENDENT_AMBULATORY_CARE_PROVIDER_SITE_OTHER): Payer: Federal, State, Local not specified - PPO | Admitting: Family Medicine

## 2017-04-27 ENCOUNTER — Encounter: Payer: Self-pay | Admitting: Family Medicine

## 2017-04-27 VITALS — BP 138/72 | HR 74 | Ht 67.0 in | Wt 158.0 lb

## 2017-04-27 DIAGNOSIS — M999 Biomechanical lesion, unspecified: Secondary | ICD-10-CM

## 2017-04-27 DIAGNOSIS — M533 Sacrococcygeal disorders, not elsewhere classified: Secondary | ICD-10-CM | POA: Diagnosis not present

## 2017-04-27 NOTE — Assessment & Plan Note (Signed)
Decision today to treat with OMT was based on Physical Exam  After verbal consent patient was treated with HVLA, ME, FPR techniques in cervical, thoracic, rib, lumbar and sacral areas  Patient tolerated the procedure well with improvement in symptoms  Patient given exercises, stretches and lifestyle modifications  See medications in patient instructions if given  Patient will follow up in 4 weeks 

## 2017-04-27 NOTE — Assessment & Plan Note (Signed)
>>  ASSESSMENT AND PLAN FOR NONALLOPATHIC LESION-RIB CAGE WRITTEN ON 04/27/2017  3:34 PM BY Judi Saa, DO  Decision today to treat with OMT was based on Physical Exam  After verbal consent patient was treated with HVLA, ME, FPR techniques in cervical, thoracic, rib, lumbar and sacral areas  Patient tolerated the procedure well with improvement in symptoms  Patient given exercises, stretches and lifestyle modifications  See medications in patient instructions if given  Patient will follow up in 4 weeks

## 2017-04-27 NOTE — Assessment & Plan Note (Signed)
Seem to be more the cause this time.  Discussed icing regimen and home exercises.  Discussed which activities to do including hip abductor strengthening.  In addition of ibuprofen for breakthrough pain but has had difficulty with stomach issues.  I do feel that stress is playing a significant role.  Follow-up again in 4-6 weeks

## 2017-04-27 NOTE — Progress Notes (Signed)
[name] [address] [place], [place] [address] Phone: [phone] Subjective:      CC: Bilateral knee pain, back pain follow-up  IFO:[id number]  Christina [name] is a 47 y.o. female coming in with complaint of quad tendon pain. She does work at a new job which requires squatting and this has pain in her knees. Pain is constant throughout work.  Patient has had intermittent pain previously.  Has been doing more boxing up and getting in a more flexed position on a regular basis.  Feels like that is exacerbated some of it.  States that it seems to be right above the knee is always.  Only with that activity but otherwise does not notice it much.  Patient has had low back pain.  Responded very well to osteopathic manipulation.  Having increasing tightness.  Recently has had increasing stress as well.  Not doing the exercises regularly secondary to time in the loss in her family.  Patient states     Past Medical History:  Diagnosis Date  . Hypercholesteremia   . Hyperthyroidism   . Migraines    with asthma  . Slipping rib syndrome    Past Surgical History:  Procedure Laterality Date  . BREAST BIOPSY Right 06-26-14   benign per patient   Social History   Socioeconomic History  . Marital status: Married    Spouse name: Not on file  . Number of children: 1  . Years of education: 34  . Highest education level: Not on file  Occupational History  . Occupation: Licensed conveyancer Needs  . Financial resource strain: Not on file  . Food insecurity:    Worry: Not on file    Inability: Not on file  . Transportation needs:    Medical: Not on file    Non-medical: Not on file  Tobacco Use  . Smoking status: Never Smoker  . Smokeless tobacco: Never Used  Substance and Sexual Activity  . Alcohol use: Yes    Alcohol/week: 0.0 oz    Comment: On occasion   . Drug use: No  . Sexual activity: Yes    Partners: Male    Birth control/protection: Condom    Lifestyle  . Physical activity:    Days per week: Not on file    Minutes per session: Not on file  . Stress: Not on file  Relationships  . Social connections:    Talks on phone: Not on file    Gets together: Not on file    Attends religious service: Not on file    Active member of club or organization: Not on file    Attends meetings of clubs or organizations: Not on file    Relationship status: Not on file  Other Topics Concern  . Not on file  Social History Narrative   Born and raised in [place].    Currently resides in a house with her child. No pets. Fun: Go to the movies.    Denies religious beliefs effecting health care.    Allergies  Allergen Reactions  . Topamax [Topiramate]     Pain in legs, SOB, and almost passed out   Family History  Problem Relation Age of Onset  . Diabetes Mother   . Hypertension Mother   . Colon cancer Mother 28  . Breast cancer Mother   . Prostate cancer Father   . Stomach cancer Neg Hx   . Esophageal cancer Neg Hx  Past medical history, social, surgical and family history all reviewed in electronic medical record.  No pertanent information unless stated regarding to the chief complaint.   Review of Systems:Review of systems updated and as accurate as of 04/27/17  No headache, visual changes, nausea, vomiting, diarrhea, constipation, dizziness, abdominal pain, skin rash, fevers, chills, night sweats, weight loss, swollen lymph nodes, body aches, joint swelling, chest pain, shortness of breath, mood changes.  Mild positive muscle aches  Objective  Blood pressure 138/72, pulse 74, height 5\' 7"  (1.702 m), weight 158 lb (71.7 kg), SpO2 97 %. Systems examined below as of 04/27/17   General: No apparent distress alert and oriented x3 mood and affect normal, dressed appropriately.  HEENT: Pupils equal, extraocular movements intact  Respiratory: Patient's speak in full sentences and does not appear short of breath  Cardiovascular: No  lower extremity edema, non tender, no erythema  Skin: Warm dry intact with no signs of infection or rash on extremities or on axial skeleton.  Abdomen: Soft nontender  Neuro: Cranial nerves II through XII are intact, neurovascularly intact in all extremities with 2+ DTRs and 2+ pulses.  Lymph: No lymphadenopathy of posterior or anterior cervical chain or axillae bilaterally.  Gait normal with good balance and coordination.  MSK:  Non tender with full range of motion and good stability and symmetric strength and tone of shoulders, elbows, wrist, hip,  and ankles bilaterally.  Bilateral knee exam shows some mild tightness and tenderness over the quadricep tendons distally.  Patient has full range of motion.  Very minimal amount of crepitus. Back Exam:  Inspection: Mild loss of lordosis Motion: Flexion 45 deg, Extension 15 deg, Side Bending to 35 deg bilaterally,  Rotation to 45 deg bilaterally  SLR laying: Negative  XSLR laying: Negative  Palpable tenderness: Tender over the right sacroiliac joint. FABER: Positive right. Sensory change: Gross sensation intact to all lumbar and sacral dermatomes.  Reflexes: 2+ at both patellar tendons, 2+ at achilles tendons, Babinski's downgoing.  Strength at foot  Plantar-flexion: 5/5 Dorsi-flexion: 5/5 Eversion: 5/5 Inversion: 5/5  Leg strength  Quad: 5/5 Hamstring: 5/5 Hip flexor: 5/5 Hip abductors: 5/5  Gait unremarkable.   Osteopathic findings C5 flexed rotated and side bent left T3 extended rotated and side bent right inhaled third rib T5 extended rotated and side bent left L5 flexed rotated and side bent left  Sacrum right on right    Impression and Recommendations:     This case required medical decision making of moderate complexity.      Note: This dictation was prepared with Dragon dictation along with smaller phrase technology. Any transcriptional errors that result from this process are unintentional.

## 2017-04-27 NOTE — Patient Instructions (Signed)
Good to see you  Christina [name] is your friend.  I am sorry for your loss.  For the knees just get a gel pad or pillow to put your knee down on  I think getting you back to your routine should help  See me again in 5-6 weeks

## 2017-04-27 NOTE — Assessment & Plan Note (Signed)
Decision today to treat with OMT was based on Physical Exam  After verbal consent patient was treated with HVLA, ME, FPR techniques in cervical, thoracic, rib lumbar and sacral areas  Patient tolerated the procedure well with improvement in symptoms  Patient given exercises, stretches and lifestyle modifications  See medications in patient instructions if given  Patient will follow up in 4 weeks 

## 2017-05-20 ENCOUNTER — Encounter: Payer: Self-pay | Admitting: Neurology

## 2017-06-07 NOTE — Progress Notes (Signed)
[name] [address] [place], [place] [address] Phone: [phone] Subjective:      CC:   Low back pain   [id number]  Christina [name] is a 47 y.o. female coming in with complaint of back pain. She is following up for lower back pain. She is her for OMT today.  Patient has had this for some time but is overall doing relatively well.  Patient is having very minimal pain.  Patient has made significant progress overall.  Very mild discomfort from time to time.  Unable to do daily activities without significant problems.  Just has stopped lifting on a regular basis.      Past Medical History:  Diagnosis Date  . Hypercholesteremia   . Hyperthyroidism   . Migraines    with asthma  . Slipping rib syndrome    Past Surgical History:  Procedure Laterality Date  . BREAST BIOPSY Right 06-26-14   benign per patient   Social History   Socioeconomic History  . Marital status: Married    Spouse name: Not on file  . Number of children: 1  . Years of education: 62  . Highest education level: Not on file  Occupational History  . Occupation: Licensed conveyancer Needs  . Financial resource strain: Not on file  . Food insecurity:    Worry: Not on file    Inability: Not on file  . Transportation needs:    Medical: Not on file    Non-medical: Not on file  Tobacco Use  . Smoking status: Never Smoker  . Smokeless tobacco: Never Used  Substance and Sexual Activity  . Alcohol use: Yes    Alcohol/week: 0.0 oz    Comment: On occasion   . Drug use: No  . Sexual activity: Yes    Partners: Male    Birth control/protection: Condom  Lifestyle  . Physical activity:    Days per week: Not on file    Minutes per session: Not on file  . Stress: Not on file  Relationships  . Social connections:    Talks on phone: Not on file    Gets together: Not on file    Attends religious service: Not on file    Active member of club or organization: Not on file   Attends meetings of clubs or organizations: Not on file    Relationship status: Not on file  Other Topics Concern  . Not on file  Social History Narrative   Born and raised in [place].    Currently resides in a house with her child. No pets. Fun: Go to the movies.    Denies religious beliefs effecting health care.    Allergies  Allergen Reactions  . Topamax [Topiramate]     Pain in legs, SOB, and almost passed out   Family History  Problem Relation Age of Onset  . Diabetes Mother   . Hypertension Mother   . Colon cancer Mother 16  . Breast cancer Mother   . Prostate cancer Father   . Stomach cancer Neg Hx   . Esophageal cancer Neg Hx      Past medical history, social, surgical and family history all reviewed in electronic medical record.  No pertanent information unless stated regarding to the chief complaint.   Review of Systems:Review of systems updated and as accurate as of 06/08/17  No headache, visual changes, nausea, vomiting, diarrhea, constipation, dizziness, abdominal pain, skin rash, fevers, chills, night sweats, weight  loss, swollen lymph nodes, body aches, joint swelling, muscle aches, chest pain, shortness of breath, mood changes.   Objective  Blood pressure 110/78, pulse 85, weight 152 lb (68.9 kg), SpO2 97 %. Systems examined below as of 06/08/17   General: No apparent distress alert and oriented x3 mood and affect normal, dressed appropriately.  HEENT: Pupils equal, extraocular movements intact  Respiratory: Patient's speak in full sentences and does not appear short of breath  Cardiovascular: No lower extremity edema, non tender, no erythema  Skin: Warm dry intact with no signs of infection or rash on extremities or on axial skeleton.  Abdomen: Soft nontender  Neuro: Cranial nerves II through XII are intact, neurovascularly intact in all extremities with 2+ DTRs and 2+ pulses.  Lymph: No lymphadenopathy of posterior or anterior cervical chain or axillae  bilaterally.  Gait normal with good balance and coordination.  MSK:  Non tender with full range of motion and good stability and symmetric strength and tone of shoulders, elbows, wrist, hip, knee and ankles bilaterally.  Back Exam:  Inspection: Unremarkable  Motion: Flexion 45 deg, Extension 25 deg, Side Bending to 35 deg bilaterally,  Rotation to 45 deg bilaterally  SLR laying: Negative  XSLR laying: Negative  Palpable tenderness: Tender to palpation the paraspinal musculature. FABER: Tightness bilaterally. Sensory change: Gross sensation intact to all lumbar and sacral dermatomes.  Reflexes: 2+ at both patellar tendons, 2+ at achilles tendons, Babinski's downgoing.  Strength at foot  Plantar-flexion: 5/5 Dorsi-flexion: 5/5 Eversion: 5/5 Inversion: 5/5  Leg strength  Quad: 5/5 Hamstring: 5/5 Hip flexor: 5/5 Hip abductors: 5/5  Gait unremarkable.  Osteopathic findings C2 flexed rotated and side bent right C4 flexed rotated and side bent left C7 flexed rotated and side bent left T3 extended rotated and side bent right inhaled third rib T6 extended rotated and side bent left L2 flexed rotated and side bent right L4 flexed rotated side bent left Sacrum right on right    Impression and Recommendations:     This case required medical decision making of moderate complexity.      Note: This dictation was prepared with Dragon dictation along with smaller phrase technology. Any transcriptional errors that result from this process are unintentional.

## 2017-06-08 ENCOUNTER — Ambulatory Visit (INDEPENDENT_AMBULATORY_CARE_PROVIDER_SITE_OTHER): Payer: Federal, State, Local not specified - PPO | Admitting: Family Medicine

## 2017-06-08 ENCOUNTER — Other Ambulatory Visit: Payer: Self-pay | Admitting: Certified Nurse Midwife

## 2017-06-08 ENCOUNTER — Encounter: Payer: Self-pay | Admitting: Family Medicine

## 2017-06-08 VITALS — BP 110/78 | HR 85 | Wt 152.0 lb

## 2017-06-08 DIAGNOSIS — M940 Chondrocostal junction syndrome [Tietze]: Secondary | ICD-10-CM

## 2017-06-08 DIAGNOSIS — M999 Biomechanical lesion, unspecified: Secondary | ICD-10-CM | POA: Diagnosis not present

## 2017-06-08 DIAGNOSIS — Z1231 Encounter for screening mammogram for malignant neoplasm of breast: Secondary | ICD-10-CM

## 2017-06-08 NOTE — Assessment & Plan Note (Signed)
Decision today to treat with OMT was based on Physical Exam  After verbal consent patient was treated with HVLA, ME, FPR techniques in cervical, thoracic, rib, lumbar and sacral areas  Patient tolerated the procedure well with improvement in symptoms  Patient given exercises, stretches and lifestyle modifications  See medications in patient instructions if given  Patient will follow up in 8 weeks 

## 2017-06-08 NOTE — Patient Instructions (Signed)
You are awesome Stay active Good luck with the new [facility] See em again in 6-8 weeks

## 2017-06-08 NOTE — Assessment & Plan Note (Signed)
Symptoms syndrome.  Discussed with patient at this point about tightness.  We discussed which activities to do which wants to avoid.  Patient will increase activity as tolerated.  Patient is doing significantly well and we will space out to 17-month intervals.

## 2017-06-15 ENCOUNTER — Encounter: Payer: Self-pay | Admitting: Internal Medicine

## 2017-07-01 DIAGNOSIS — K080 Exfoliation of teeth due to systemic causes: Secondary | ICD-10-CM | POA: Diagnosis not present

## 2017-07-25 ENCOUNTER — Ambulatory Visit
Admission: RE | Admit: 2017-07-25 | Discharge: 2017-07-25 | Disposition: A | Discharge status: Home / Self Care | Payer: Federal, State, Local not specified - PPO | Source: Ambulatory Visit | Attending: Certified Nurse Midwife | Admitting: Certified Nurse Midwife

## 2017-07-25 DIAGNOSIS — Z1231 Encounter for screening mammogram for malignant neoplasm of breast: Secondary | ICD-10-CM | POA: Diagnosis not present

## 2017-07-26 ENCOUNTER — Other Ambulatory Visit: Payer: Self-pay | Admitting: Certified Nurse Midwife

## 2017-07-26 DIAGNOSIS — R928 Other abnormal and inconclusive findings on diagnostic imaging of breast: Secondary | ICD-10-CM

## 2017-07-29 ENCOUNTER — Encounter: Payer: Self-pay | Admitting: Internal Medicine

## 2017-07-29 ENCOUNTER — Ambulatory Visit
Admission: RE | Admit: 2017-07-29 | Discharge: 2017-07-29 | Disposition: A | Discharge status: Home / Self Care | Payer: Federal, State, Local not specified - PPO | Source: Ambulatory Visit | Attending: Certified Nurse Midwife | Admitting: Certified Nurse Midwife

## 2017-07-29 DIAGNOSIS — R928 Other abnormal and inconclusive findings on diagnostic imaging of breast: Secondary | ICD-10-CM

## 2017-07-29 DIAGNOSIS — R922 Inconclusive mammogram: Secondary | ICD-10-CM | POA: Diagnosis not present

## 2017-07-29 DIAGNOSIS — N6002 Solitary cyst of left breast: Secondary | ICD-10-CM | POA: Diagnosis not present

## 2017-08-10 ENCOUNTER — Ambulatory Visit: Payer: Federal, State, Local not specified - PPO | Admitting: Certified Nurse Midwife

## 2017-08-16 NOTE — Progress Notes (Signed)
[name] [address] [place], [place] [address] Phone: [phone] Subjective:    I'm seeing this patient by the request  of:    CC: Back pain  [id number]  Christina [name] is a 47 y.o. female coming in with complaint of back pain. She is her today OMT.  Prescription patient was slipped rib syndrome.  Has responded well to osteopathic manipulation.  Discussed which activities to do which wants to avoid patient has been doing topical anti-inflammatories.  Has been feeling good overall.  Nothing new.  Mild discomfort as well.      Past Medical History:  Diagnosis Date  . Hypercholesteremia   . Hyperthyroidism   . Migraines    with asthma  . Slipping rib syndrome    Past Surgical History:  Procedure Laterality Date  . BREAST BIOPSY Right 06-26-14   benign per patient   Social History   Socioeconomic History  . Marital status: Married    Spouse name: Not on file  . Number of children: 1  . Years of education: 61  . Highest education level: Not on file  Occupational History  . Occupation: Licensed conveyancer Needs  . Financial resource strain: Not on file  . Food insecurity:    Worry: Not on file    Inability: Not on file  . Transportation needs:    Medical: Not on file    Non-medical: Not on file  Tobacco Use  . Smoking status: Never Smoker  . Smokeless tobacco: Never Used  Substance and Sexual Activity  . Alcohol use: Yes    Alcohol/week: 0.0 oz    Comment: On occasion   . Drug use: No  . Sexual activity: Yes    Partners: Male    Birth control/protection: Condom  Lifestyle  . Physical activity:    Days per week: Not on file    Minutes per session: Not on file  . Stress: Not on file  Relationships  . Social connections:    Talks on phone: Not on file    Gets together: Not on file    Attends religious service: Not on file    Active member of club or organization: Not on file    Attends meetings of clubs or  organizations: Not on file    Relationship status: Not on file  Other Topics Concern  . Not on file  Social History Narrative   Born and raised in [place].    Currently resides in a house with her child. No pets. Fun: Go to the movies.    Denies religious beliefs effecting health care.    Allergies  Allergen Reactions  . Topamax [Topiramate]     Pain in legs, SOB, and almost passed out   Family History  Problem Relation Age of Onset  . Diabetes Mother   . Hypertension Mother   . Colon cancer Mother 62  . Breast cancer Mother 99  . Prostate cancer Father   . Stomach cancer Neg Hx   . Esophageal cancer Neg Hx      Past medical history, social, surgical and family history all reviewed in electronic medical record.  No pertanent information unless stated regarding to the chief complaint.   Review of Systems:Review of systems updated and as accurate as of 08/17/17  No headache, visual changes, nausea, vomiting, diarrhea, constipation, dizziness, abdominal pain, skin rash, fevers, chills, night sweats, weight loss, swollen lymph nodes, body aches, joint swelling, chest pain, shortness  of breath, mood changes.  Positive muscle aches  Objective  Blood pressure 110/76, pulse 77, height 5\' 7"  (1.702 m), weight 154 lb (69.9 kg), SpO2 98 %. Systems examined below as of 08/17/17   General: No apparent distress alert and oriented x3 mood and affect normal, dressed appropriately.  HEENT: Pupils equal, extraocular movements intact  Respiratory: Patient's speak in full sentences and does not appear short of breath  Cardiovascular: No lower extremity edema, non tender, no erythema  Skin: Warm dry intact with no signs of infection or rash on extremities or on axial skeleton.  Abdomen: Soft nontender  Neuro: Cranial nerves II through XII are intact, neurovascularly intact in all extremities with 2+ DTRs and 2+ pulses.  Lymph: No lymphadenopathy of posterior or anterior cervical chain or  axillae bilaterally.  Gait normal with good balance and coordination.  MSK:  Non tender with full range of motion and good stability and symmetric strength and tone of shoulders, elbows, wrist, hip, knee and ankles bilaterally.  Neck: Inspection mild loss of lordosis. No palpable stepoffs. Negative Spurling's maneuver. Mild limited range of motion of the neck 5 degrees of rotation and sidebending Grip strength and sensation normal in bilateral hands Strength good C4 to T1 distribution No sensory change to C4 to T1 Negative Hoffman sign bilaterally Reflexes normal Tightness of the right trapezius.  Osteopathic findings C2 flexed rotated and side bent right C4 flexed rotated and side bent left T3 extended rotated and side bent right inhaled third rib T5 extended rotated and side bent left L2 flexed rotated and side bent right Sacrum right on right     Impression and Recommendations:     This case required medical decision making of moderate complexity.      Note: This dictation was prepared with Dragon dictation along with smaller phrase technology. Any transcriptional errors that result from this process are unintentional.

## 2017-08-17 ENCOUNTER — Ambulatory Visit: Payer: Federal, State, Local not specified - PPO | Admitting: Family Medicine

## 2017-08-17 ENCOUNTER — Encounter: Payer: Self-pay | Admitting: Family Medicine

## 2017-08-17 VITALS — BP 110/76 | HR 77 | Ht 67.0 in | Wt 154.0 lb

## 2017-08-17 DIAGNOSIS — M940 Chondrocostal junction syndrome [Tietze]: Secondary | ICD-10-CM | POA: Diagnosis not present

## 2017-08-17 DIAGNOSIS — M9981 Other biomechanical lesions of cervical region: Secondary | ICD-10-CM | POA: Diagnosis not present

## 2017-08-17 DIAGNOSIS — M999 Biomechanical lesion, unspecified: Secondary | ICD-10-CM

## 2017-08-17 NOTE — Assessment & Plan Note (Signed)
Stable overall.  Discussed icing regimen and home exercises.  Discussed which activities to do.  Posture and ergonomics and lifting mechanics.  Follow-up again in 2 months

## 2017-08-17 NOTE — Assessment & Plan Note (Signed)
Decision today to treat with OMT was based on Physical Exam  After verbal consent patient was treated with HVLA, ME, FPR techniques in cervical, thoracic, rib, lumbar and sacral areas  Patient tolerated the procedure well with improvement in symptoms  Patient given exercises, stretches and lifestyle modifications  See medications in patient instructions if given  Patient will follow up in 4 weeks 

## 2017-08-17 NOTE — Patient Instructions (Signed)
Good to see you  8 weeks

## 2017-08-22 ENCOUNTER — Telehealth: Payer: Self-pay | Admitting: Endocrinology

## 2017-08-22 ENCOUNTER — Other Ambulatory Visit: Payer: Self-pay

## 2017-08-22 MED ORDER — METHIMAZOLE 5 MG PO TABS: 5.0000 mg | ORAL_TABLET | ORAL | 5 refills | Status: DC

## 2017-08-22 NOTE — Telephone Encounter (Signed)
methimazole (TAPAZOLE) 5 MG tablet     Patient is needing her prescription sent into the pharmacy     [place], [place]

## 2017-08-22 NOTE — Telephone Encounter (Signed)
I have sent to patient;'s pharmacy.  

## 2017-08-24 ENCOUNTER — Encounter: Payer: Self-pay | Admitting: Certified Nurse Midwife

## 2017-08-24 ENCOUNTER — Ambulatory Visit: Payer: Federal, State, Local not specified - PPO | Admitting: Certified Nurse Midwife

## 2017-08-24 VITALS — BP 106/72 | HR 72 | Ht 67.5 in | Wt 153.2 lb

## 2017-08-24 DIAGNOSIS — Z01419 Encounter for gynecological examination (general) (routine) without abnormal findings: Secondary | ICD-10-CM

## 2017-08-24 DIAGNOSIS — Z800 Family history of malignant neoplasm of digestive organs: Secondary | ICD-10-CM

## 2017-08-24 DIAGNOSIS — Z872 Personal history of diseases of the skin and subcutaneous tissue: Secondary | ICD-10-CM | POA: Diagnosis not present

## 2017-08-24 NOTE — Progress Notes (Signed)
47 y.o. [id number] Married  African American Fe here for annual exam.  Periods normal but not lasting as long, 2-3 days only. Recent mammogram with cyst noted in left breast which was benign. Has colonoscopy scheduled for 9/19 due to family history of colon cancer. Sees PCP for aex/labs and medication management as needed. Recent passing of mother in law and now taking care of her cat, but difficulties with allergies with cat. Spouse helping care now, which has helped. No health issues today.   LMP: 08/01/2017       Sexually active: Yes.    The current method of family planning is condoms most of the time.    Exercising: No.  The patient has a physically strenuous job, but has no regular exercise apart from work.  Smoker:  no  Review of Systems  Constitutional: Negative.   HENT: Negative.   Eyes: Negative.   Respiratory: Negative.   Cardiovascular: Negative.   Gastrointestinal: Negative.   Genitourinary: Negative.   Musculoskeletal: Negative.   Skin: Negative.   Neurological: Negative.   Endo/Heme/Allergies: Negative.   Psychiatric/Behavioral: Negative.     Health Maintenance: Pap:  07-10-14 neg HPV HR neg, 07-29-16 neg History of Abnormal Pap: no MMG:  7/19 bilateral & left breast u/s birads 2:neg Self Breast exams: yes Colonoscopy:  2014 f/u 82yrs, next appt sept 22, 2019 [facility] BMD:   none TDaP:  2016 Shingles: no Pneumonia: no Hep C and HIV: 2015 both neg per pt Labs:    03/08/17 by PCP                                                                                                                                                                                                                             reports that she has never smoked. She has never used smokeless tobacco. She reports that she drinks alcohol. She reports that she does not use drugs.  Past Medical History:  Diagnosis Date  . Hypercholesteremia   . Hyperthyroidism   . Migraines    with asthma  . Slipping rib  syndrome     Past Surgical History:  Procedure Laterality Date  . BREAST BIOPSY Right 06-26-14   benign per patient    Current Outpatient Medications  Medication Sig Dispense Refill  . aspirin 81 MG tablet Take 81 mg by mouth daily.    Garlan Fillers Peroxide (EAR DROPS OT) Place in ear(s) as needed.     Marland Kitchen  cholecalciferol (VITAMIN D) 1000 units tablet Take 1,000 Units by mouth daily.    Marland Kitchen doxycycline (VIBRA-TABS) 100 MG tablet Take 1 tablet (100 mg total) by mouth 2 (two) times daily. 20 tablet 0  . gabapentin (NEURONTIN) 100 MG capsule Take 1 capsule (100 mg total) by mouth at bedtime. 30 capsule 0  . Ibuprofen-Famotidine 800-26.6 MG TABS Take 1 tablet by mouth 3 (three) times daily as needed. 90 tablet 1  . Iron TABS Take by mouth.    . methimazole (TAPAZOLE) 5 MG tablet Take 1 tablet (5 mg total) by mouth 3 (three) times a week. 13 tablet 5  . omeprazole (PRILOSEC) 40 MG capsule Take 1 capsule (40 mg total) by mouth daily. 30 capsule 11  . Pyridoxine HCl (B-6) 100 MG TABS Take by mouth.    . vitamin B-12 (CYANOCOBALAMIN) 1000 MCG tablet Take 1,000 mcg by mouth daily.    . vitamin C (ASCORBIC ACID) 500 MG tablet Take 500 mg by mouth daily.     No current facility-administered medications for this visit.     Family History  Problem Relation Age of Onset  . Diabetes Mother   . Hypertension Mother   . Colon cancer Mother 40  . Breast cancer Mother 77  . Prostate cancer Father   . Stomach cancer Neg Hx   . Esophageal cancer Neg Hx     ROS:  Pertinent items are noted in HPI.  Otherwise, a comprehensive ROS was negative.  Exam:   There were no vitals taken for this visit.   Ht Readings from Last 3 Encounters:  08/17/17 5\' 7"  (1.702 m)  04/27/17 5\' 7"  (1.702 m)  03/23/17 5\' 7"  (1.702 m)    General appearance: alert, cooperative and appears stated age Head: Normocephalic, without obvious abnormality, atraumatic Neck: no adenopathy, supple, symmetrical, trachea midline and  thyroid normal to inspection and palpation Lungs: clear to auscultation bilaterally Breasts: normal appearance, no masses or tenderness, No nipple retraction or dimpling, No nipple discharge or bleeding, No axillary or supraclavicular adenopathy, left breast mass identified as cyst on mammogram noted at 1 o'clock at aerola edge in left breast. Correlates with mammogram report Heart: regular rate and rhythm Abdomen: soft, non-tender; no masses,  no organomegaly Extremities: extremities normal, atraumatic, no cyanosis or edema Skin: Skin color, texture, turgor normal. No rashes or lesions Lymph nodes: Cervical, supraclavicular, and axillary nodes normal. No abnormal inguinal nodes palpated Neurologic: Grossly normal   Pelvic: External genitalia:  no lesions              Urethra:  normal appearing urethra with no masses, tenderness or lesions              Bartholin's and Skene's: normal                 Vagina: normal appearing vagina with normal color and discharge, no lesions              Cervix: no cervical motion tenderness, no lesions and normal appearance              Pap taken: No. Bimanual Exam:  Uterus:  normal size, contour, position, consistency, mobility, non-tender              Adnexa: normal adnexa and no mass, fullness, tenderness               Rectovaginal: Confirms               Anus:  normal sphincter tone, no lesions  Chaperone present: yes  A:  Well Woman with normal exam  Contraception Condoms  Left breast mass noted on mammogram and exam found to be cyst only  Family history of colon cancer mother  Colonoscopy scheduled 9/19  P:   Reviewed health and wellness pertinent to exam  Stressed consistent use.  Shown patient area in breast, so she would be aware if change.   Keep colonoscopy appointment.   Pap smear: no   counseled on breast self exam, mammography screening, feminine hygiene, adequate intake of calcium and vitamin D, diet and exercise  return annually  or prn  An After Visit Summary was printed and given to the patient.

## 2017-08-24 NOTE — Patient Instructions (Signed)

## 2017-09-13 ENCOUNTER — Ambulatory Visit: Payer: Federal, State, Local not specified - PPO | Admitting: Endocrinology

## 2017-09-13 ENCOUNTER — Encounter: Payer: Self-pay | Admitting: Endocrinology

## 2017-09-13 VITALS — BP 116/70 | HR 64 | Temp 98.2°F | Ht 67.5 in | Wt 152.8 lb

## 2017-09-13 DIAGNOSIS — E0590 Thyrotoxicosis, unspecified without thyrotoxic crisis or storm: Secondary | ICD-10-CM | POA: Diagnosis not present

## 2017-09-13 NOTE — Patient Instructions (Addendum)
A thyroid blood test is requested for you today.  We'll let you know about the results.   If ever you have fever while taking methimazole, stop it and call us, even if the reason is obvious, because of the risk of a rare side-effect.   Please come back for a follow-up appointment in 6 months.   

## 2017-09-13 NOTE — Progress Notes (Signed)
Subjective:    Patient ID: Christina [name]. [name], female    DOB: 1970-02-15, 47 y.o.   MRN: [id number]  HPI Pt returns for f/u of hyperthyroidism (dx'ed in early 2015, in [place]; nuc med scan showed diffuse uptake (45% at 24 hrs); [place] in 2017 showed diffuse goiter, with multiple very small nodules; pt is unaware why tapazole was chosen as rx, but she wishes to continue). she takes tapazole as rx'ed.  pt states she feels well in general.  She does not notice any swelling at the ant neck, palpitations, or tremor.   Past Medical History:  Diagnosis Date  . Hypercholesteremia   . Hyperthyroidism   . Migraines    with asthma  . Slipping rib syndrome     Past Surgical History:  Procedure Laterality Date  . BREAST BIOPSY Right 06-26-14   benign per patient    Social History   Socioeconomic History  . Marital status: Married    Spouse name: Not on file  . Number of children: 1  . Years of education: 76  . Highest education level: Not on file  Occupational History  . Occupation: Licensed conveyancer Needs  . Financial resource strain: Not on file  . Food insecurity:    Worry: Not on file    Inability: Not on file  . Transportation needs:    Medical: Not on file    Non-medical: Not on file  Tobacco Use  . Smoking status: Never Smoker  . Smokeless tobacco: Never Used  Substance and Sexual Activity  . Alcohol use: Yes    Alcohol/week: 0.0 standard drinks    Comment: On occasion   . Drug use: No  . Sexual activity: Yes    Partners: Male    Birth control/protection: Condom  Lifestyle  . Physical activity:    Days per week: Not on file    Minutes per session: Not on file  . Stress: Not on file  Relationships  . Social connections:    Talks on phone: Not on file    Gets together: Not on file    Attends religious service: Not on file    Active member of club or organization: Not on file    Attends meetings of clubs or organizations: Not on file    Relationship status: Not on  file  . Intimate partner violence:    Fear of current or ex partner: Not on file    Emotionally abused: Not on file    Physically abused: Not on file    Forced sexual activity: Not on file  Other Topics Concern  . Not on file  Social History Narrative   Born and raised in [place].    Currently resides in a house with her child. No pets. Fun: Go to the movies.    Denies religious beliefs effecting health care.     Current Outpatient Medications on File Prior to Visit  Medication Sig Dispense Refill  . Carbamide Peroxide (EAR DROPS OT) Place in ear(s) as needed.     . cholecalciferol (VITAMIN D) 1000 units tablet Take 1,000 Units by mouth daily.    . Iron TABS Take by mouth.    . methimazole (TAPAZOLE) 5 MG tablet Take 1 tablet (5 mg total) by mouth 3 (three) times a week. 13 tablet 5  . omeprazole (PRILOSEC) 40 MG capsule Take 1 capsule (40 mg total) by mouth daily. 30 capsule 11  . vitamin B-12 (CYANOCOBALAMIN) 1000 MCG tablet Take 1,000 mcg  by mouth daily.    . vitamin C (ASCORBIC ACID) 500 MG tablet Take 500 mg by mouth daily.     No current facility-administered medications on file prior to visit.     Allergies  Allergen Reactions  . Topamax [Topiramate]     Pain in legs, SOB, and almost passed out    Family History  Problem Relation Age of Onset  . Diabetes Mother   . Hypertension Mother   . Colon cancer Mother 81  . Breast cancer Mother 58  . Prostate cancer Father   . Stomach cancer Neg Hx   . Esophageal cancer Neg Hx     BP 116/70 (BP Location: Right Arm, Patient Position: Sitting, Cuff Size: Normal)   Pulse 64   Temp 98.2 F (36.8 C) (Oral)   Ht 5' 7.5" (1.715 m)   Wt 152 lb 12.8 oz (69.3 kg)   LMP 08/28/2017   SpO2 97%   BMI 23.58 kg/m    Review of Systems Denies fever.      Objective:   Physical Exam VITAL SIGNS:  See vs page GENERAL: no distress NECK: There is no palpable thyroid enlargement.  No thyroid nodule is palpable.  No palpable  lymphadenopathy at the anterior neck.     Lab Results  Component Value Date   TSH 2.41 09/14/2017       Assessment & Plan:  Hyperthyroidism: well-controlled Migraine: in this setting, she needs to maintain euthyroidism   Patient Instructions  A thyroid blood test is requested for you today.  We'll let you know about the results.   If ever you have fever while taking methimazole, stop it and call us, even if the reason is obvious, because of the risk of a rare side-effect.     Please come back for a follow-up appointment in 6 months.

## 2017-09-14 ENCOUNTER — Other Ambulatory Visit (INDEPENDENT_AMBULATORY_CARE_PROVIDER_SITE_OTHER): Payer: Federal, State, Local not specified - PPO

## 2017-09-14 DIAGNOSIS — E0590 Thyrotoxicosis, unspecified without thyrotoxic crisis or storm: Secondary | ICD-10-CM

## 2017-09-14 LAB — T4, FREE: Free T4: 0.65 ng/dL (ref 0.60–1.60)

## 2017-09-14 LAB — TSH: TSH: 2.41 u[IU]/mL (ref 0.35–4.50)

## 2017-10-07 ENCOUNTER — Encounter: Payer: Self-pay | Admitting: Internal Medicine

## 2017-10-07 ENCOUNTER — Ambulatory Visit (AMBULATORY_SURGERY_CENTER): Payer: Self-pay | Admitting: *Deleted

## 2017-10-07 VITALS — Ht 67.0 in | Wt 154.0 lb

## 2017-10-07 DIAGNOSIS — Z800 Family history of malignant neoplasm of digestive organs: Secondary | ICD-10-CM

## 2017-10-07 MED ORDER — NA SULFATE-K SULFATE-MG SULF 17.5-3.13-1.6 GM/177ML PO SOLN: ORAL | 0 refills | Status: DC

## 2017-10-07 NOTE — Progress Notes (Signed)
Patient denies any allergies to eggs or soy. Patient denies any problems with anesthesia/sedation. Patient denies any oxygen use at home. Patient denies taking any diet/weight loss medications or blood thinners. EMMI education assisgned to patient on colonoscopy, this was explained and instructions given to patient. Suprep $15 coupon given to pt.  

## 2017-10-18 ENCOUNTER — Ambulatory Visit: Payer: Federal, State, Local not specified - PPO | Admitting: Family Medicine

## 2017-10-21 ENCOUNTER — Encounter: Payer: Self-pay | Admitting: Internal Medicine

## 2017-10-21 ENCOUNTER — Ambulatory Visit (AMBULATORY_SURGERY_CENTER): Payer: Federal, State, Local not specified - PPO | Admitting: Internal Medicine

## 2017-10-21 ENCOUNTER — Telehealth: Payer: Self-pay | Admitting: Internal Medicine

## 2017-10-21 VITALS — BP 103/67 | HR 71 | Temp 97.8°F | Resp 13 | Ht 67.0 in | Wt 152.0 lb

## 2017-10-21 DIAGNOSIS — Z1211 Encounter for screening for malignant neoplasm of colon: Secondary | ICD-10-CM | POA: Diagnosis not present

## 2017-10-21 DIAGNOSIS — Z800 Family history of malignant neoplasm of digestive organs: Secondary | ICD-10-CM

## 2017-10-21 MED ORDER — SODIUM CHLORIDE 0.9 % IV SOLN: 500.0000 mL | Freq: Once | INTRAVENOUS | Status: DC

## 2017-10-21 NOTE — Telephone Encounter (Signed)
Pt.stated that " I got all the prep down just fine last night but this morning after drinking prep and water stomach felt funny and I got sick". Pt. Stated I have been to the bathroom 3 times since I drunk prep". Pt. Describe her stools as being yellow/Robi Dewolfe liquid,stated "it is all liquid",denies any solid stool,informed pt. That as long as stool is liquid that we will be able to do procedure she stated "ok I will be there at 10:30".

## 2017-10-21 NOTE — Patient Instructions (Signed)
YOU HAD AN ENDOSCOPIC PROCEDURE TODAY AT THE [facility]:   Refer to the procedure report that was given to you for any specific questions about what was found during the examination.  If the procedure report does not answer your questions, please call your gastroenterologist to clarify.  If you requested that your care partner not be given the details of your procedure findings, then the procedure report has been included in a sealed envelope for you to review at your convenience later.  YOU SHOULD EXPECT: Some feelings of bloating in the abdomen. Passage of more gas than usual.  Walking can help get rid of the air that was put into your GI tract during the procedure and reduce the bloating. If you had a lower endoscopy (such as a colonoscopy or flexible sigmoidoscopy) you may notice spotting of blood in your stool or on the toilet paper. If you underwent a bowel prep for your procedure, you may not have a normal bowel movement for a few days.  Please Note:  You might notice some irritation and congestion in your nose or some drainage.  This is from the oxygen used during your procedure.  There is no need for concern and it should clear up in a day or so.  SYMPTOMS TO REPORT IMMEDIATELY:   Following lower endoscopy (colonoscopy or flexible sigmoidoscopy):  Excessive amounts of blood in the stool  Significant tenderness or worsening of abdominal pains  Swelling of the abdomen that is new, acute  Fever of 100F or higher  For urgent or emergent issues, a gastroenterologist can be reached at any hour by calling [phone].   DIET:  We do recommend a small meal at first, but then you may proceed to your regular diet.  Drink plenty of fluids but you should avoid alcoholic beverages for 24 hours.  ACTIVITY:  You should plan to take it easy for the rest of today and you should NOT DRIVE or use heavy machinery until tomorrow (because of the sedation medicines used during the test).     FOLLOW UP: Our staff will call the number listed on your records the next business day following your procedure to check on you and address any questions or concerns that you may have regarding the information given to you following your procedure. If we do not reach you, we will leave a message.  However, if you are feeling well and you are not experiencing any problems, there is no need to return our call.  We will assume that you have returned to your regular daily activities without incident.  If any biopsies were taken you will be contacted by phone or by letter within the next 1-3 weeks.  Please call us at [phone] if you have not heard about the biopsies in 3 weeks.    SIGNATURES/CONFIDENTIALITY: You and/or your care partner have signed paperwork which will be entered into your electronic medical record.  These signatures attest to the fact that that the information above on your After Visit Summary has been reviewed and is understood.  Full responsibility of the confidentiality of this discharge information lies with you and/or your care-partner. 

## 2017-10-21 NOTE — Progress Notes (Signed)
A/ox3 pleased with MAC, report to RN 

## 2017-10-21 NOTE — Op Note (Signed)
[facility] Patient Name: Christina [name] Procedure Date: 10/21/2017 12:19 PM MRN: [id number] Endoscopist: Docia Chuck. Henrene Pastor , MD Age: 47 Referring MD:  Date of Birth: 1970/08/17 Gender: Female Account #: [id number] Procedure:                Colonoscopy Indications:              Screening in patient at increased risk: Colorectal                            cancer in mother 86's. Previous examination in                            [place] 2014 was negative Medicines:                Monitored Anesthesia Care Procedure:                Pre-Anesthesia Assessment:                           - Prior to the procedure, a History and Physical                            was performed, and patient medications and                            allergies were reviewed. The patient's tolerance of                            previous anesthesia was also reviewed. The risks                            and benefits of the procedure and the sedation                            options and risks were discussed with the patient.                            All questions were answered, and informed consent                            was obtained. Prior Anticoagulants: The patient has                            taken no previous anticoagulant or antiplatelet                            agents. After reviewing the risks and benefits, the                            patient was deemed in satisfactory condition to                            undergo the procedure.  After obtaining informed consent, the colonoscope                            was passed under direct vision. Throughout the                            procedure, the patient's blood pressure, pulse, and                            oxygen saturations were monitored continuously. The                            Colonoscope was introduced through the anus and                            advanced to the the cecum, identified by                             appendiceal orifice and ileocecal valve. The                            ileocecal valve, appendiceal orifice, and rectum                            were photographed. The quality of the bowel                            preparation was adequate. The colonoscopy was                            performed without difficulty. The patient tolerated                            the procedure well. The bowel preparation used was                            SUPREP. Scope In: 12:30:17 PM Scope Out: 12:45:03 PM Scope Withdrawal Time: 0 hours 11 minutes 35 seconds  Total Procedure Duration: 0 hours 14 minutes 46 seconds  Findings:                 The entire examined colon appeared normal on direct                            and retroflexion views. Complications:            No immediate complications. Estimated blood loss:                            None. Estimated Blood Loss:     Estimated blood loss: none. Impression:               - The entire examined colon is normal on direct and                            retroflexion views.                           -  No specimens collected. Recommendation:           - Repeat colonoscopy in 5 years for screening                            purposes. Would recommend magnesium citrate                            followed by standard split prep, and provide                            antiemetics/promotility agents for next prep.                           - Patient has a contact number available for                            emergencies. The signs and symptoms of potential                            delayed complications were discussed with the                            patient. Return to normal activities tomorrow.                            Written discharge instructions were provided to the                            patient.                           - Resume previous diet.                           - Continue present medications. Docia Chuck. Henrene Pastor,  MD 10/21/2017 12:53:37 PM This report has been signed electronically.

## 2017-10-21 NOTE — Progress Notes (Signed)
Pt's states no medical or surgical changes since previsit or office visit. 

## 2017-10-24 ENCOUNTER — Telehealth: Payer: Self-pay

## 2017-10-24 NOTE — Progress Notes (Signed)
[name] [address] [place], [place] [address] Phone: [phone] Subjective:    I Christina [name] am serving as a Education administrator for Dr. Hulan Saas.  CC: Hip pain  [id number]  Christina [name] is a 47 y.o. female coming in with complaint of hip pain. States that the hip and back is giving her some issues. States that her slipped rib is the biggest problem. Patient still has some tightness.  Was doing a lot more cleaning of the house and seemed to have exacerbated.     Past Medical History:  Diagnosis Date  . Hypercholesteremia   . Hyperthyroidism   . Migraines    with asthma  . Slipping rib syndrome    Past Surgical History:  Procedure Laterality Date  . BREAST BIOPSY Right 06-26-14   benign per patient  . COLONOSCOPY  08/03/2012   in [place] exam,melanosis  . UPPER GASTROINTESTINAL ENDOSCOPY  09/23/2016   Social History   Socioeconomic History  . Marital status: Married    Spouse name: Not on file  . Number of children: 1  . Years of education: 37  . Highest education level: Not on file  Occupational History  . Occupation: Licensed conveyancer Needs  . Financial resource strain: Not on file  . Food insecurity:    Worry: Not on file    Inability: Not on file  . Transportation needs:    Medical: Not on file    Non-medical: Not on file  Tobacco Use  . Smoking status: Never Smoker  . Smokeless tobacco: Never Used  Substance and Sexual Activity  . Alcohol use: Not Currently    Alcohol/week: 0.0 standard drinks    Comment: On occasion   . Drug use: No  . Sexual activity: Yes    Partners: Male    Birth control/protection: Condom  Lifestyle  . Physical activity:    Days per week: Not on file    Minutes per session: Not on file  . Stress: Not on file  Relationships  . Social connections:    Talks on phone: Not on file    Gets together: Not on file    Attends religious service: Not on file    Active member of  club or organization: Not on file    Attends meetings of clubs or organizations: Not on file    Relationship status: Not on file  Other Topics Concern  . Not on file  Social History Narrative   Born and raised in [place].    Currently resides in a house with her child. No pets. Fun: Go to the movies.    Denies religious beliefs effecting health care.    Allergies  Allergen Reactions  . Topamax [Topiramate]     Pain in legs, SOB, and almost passed out   Family History  Problem Relation Age of Onset  . Diabetes Mother   . Hypertension Mother   . Colon cancer Mother 85  . Breast cancer Mother 72  . Prostate cancer Father   . Colon cancer Maternal Aunt   . Colon cancer Maternal Aunt   . Stomach cancer Neg Hx   . Esophageal cancer Neg Hx   . Colon polyps Neg Hx   . Rectal cancer Neg Hx     Current Outpatient Medications (Endocrine & Metabolic):  .  methimazole (TAPAZOLE) 5 MG tablet, Take 1 tablet (5 mg total) by mouth 3 (three) times a week.  Current Outpatient Medications (Hematological):  Marland Kitchen  Iron TABS, Take by mouth. .  vitamin B-12 (CYANOCOBALAMIN) 1000 MCG tablet, Take 1,000 mcg by mouth daily.  Current Outpatient Medications (Other):  Garlan Fillers Peroxide (EAR DROPS OT), Place in ear(s) as needed.  .  cholecalciferol (VITAMIN D) 1000 units tablet, Take 1,000 Units by mouth daily. Marland Kitchen  omeprazole (PRILOSEC) 40 MG capsule, Take 1 capsule (40 mg total) by mouth daily. .  vitamin C (ASCORBIC ACID) 500 MG tablet, Take 500 mg by mouth daily.    Past medical history, social, surgical and family history all reviewed in electronic medical record.  No pertanent information unless stated regarding to the chief complaint.   Review of Systems:  No headache, visual changes, nausea, vomiting, diarrhea, constipation, dizziness, abdominal pain, skin rash, fevers, chills, night sweats, weight loss, swollen lymph nodes, body aches, joint swelling, chest pain, shortness of breath,  mood changes.  Positive muscle aches  Objective  Blood pressure 100/70, pulse 78, height 5\' 7"  (1.702 m), weight 152 lb (68.9 kg), SpO2 98 %.   General: No apparent distress alert and oriented x3 mood and affect normal, dressed appropriately.  HEENT: Pupils equal, extraocular movements intact  Respiratory: Patient's speak in full sentences and does not appear short of breath  Cardiovascular: No lower extremity edema, non tender, no erythema  Skin: Warm dry intact with no signs of infection or rash on extremities or on axial skeleton.  Abdomen: Soft nontender  Neuro: Cranial nerves II through XII are intact, neurovascularly intact in all extremities with 2+ DTRs and 2+ pulses.  Lymph: No lymphadenopathy of posterior or anterior cervical chain or axillae bilaterally.  Gait normal with good balance and coordination.  MSK:  Non tender with full range of motion and good stability and symmetric strength and tone of shoulders, elbows, wrist, hip, knee and ankles bilaterally.  Neck: Inspection loss of lordosis. No palpable stepoffs. Negative Spurling's maneuver. Patient does have some limited range of motion lacking last 5 to 10 degrees of sidebending and rotation bilaterally Grip strength and sensation normal in bilateral hands Strength good C4 to T1 distribution No sensory change to C4 to T1 Negative Hoffman sign bilaterally Reflexes normal Tightness of the right trapezius   Back exam shows the patient does have mild loss of lordosis.  Tenderness of the right sacroiliac joint.  Leg test.  Positive Faber test.  Neurovascular intact distally  Osteopathic findings C2 flexed rotated and side bent right C4 flexed rotated and side bent left C7 flexed rotated and side bent left T3 extended rotated and side bent right inhaled third rib T6 extended rotated and side bent left L2 flexed rotated and side bent right Sacrum right on right      Impression and Recommendations:     This case  required medical decision making of moderate complexity. The above documentation has been reviewed and is accurate and complete Lyndal Pulley, DO       Note: This dictation was prepared with Dragon dictation along with smaller phrase technology. Any transcriptional errors that result from this process are unintentional.

## 2017-10-24 NOTE — Telephone Encounter (Signed)
  Follow up Call-  Call back number 10/21/2017 09/23/2016  Post procedure Call Back phone  # [phone]  Permission to leave phone message Yes Yes  Some recent data might be hidden     Patient questions:  Do you have a fever, pain , or abdominal swelling? No. Pain Score  0 *  Have you tolerated food without any problems? Yes.    Have you been able to return to your normal activities? Yes.    Do you have any questions about your discharge instructions: Diet   No. Medications  No. Follow up visit  No.  Do you have questions or concerns about your Care? No.  Actions: * If pain score is 4 or above: No action needed, pain <4.

## 2017-10-24 NOTE — Telephone Encounter (Signed)
No name or number identifier. Left a message.

## 2017-10-26 ENCOUNTER — Ambulatory Visit: Payer: Federal, State, Local not specified - PPO | Admitting: Family Medicine

## 2017-10-26 ENCOUNTER — Encounter: Payer: Self-pay | Admitting: Family Medicine

## 2017-10-26 VITALS — BP 100/70 | HR 78 | Ht 67.0 in | Wt 152.0 lb

## 2017-10-26 DIAGNOSIS — M533 Sacrococcygeal disorders, not elsewhere classified: Secondary | ICD-10-CM | POA: Diagnosis not present

## 2017-10-26 DIAGNOSIS — M999 Biomechanical lesion, unspecified: Secondary | ICD-10-CM

## 2017-10-26 NOTE — Assessment & Plan Note (Signed)
Decision today to treat with OMT was based on Physical Exam  After verbal consent patient was treated with HVLA, ME, FPR techniques in cervical, thoracic, rib lumbar and sacral areas  Patient tolerated the procedure well with improvement in symptoms  Patient given exercises, stretches and lifestyle modifications  See medications in patient instructions if given  Patient will follow up in 4-8 weeks 

## 2017-10-26 NOTE — Assessment & Plan Note (Signed)
Sacroiliac dysfunction.  Did have more difficulty on the right side.  Patient has had difficulty with her doing activities out of the ordinary daily activities.  Discussed icing regimen and home exercises.  Discussed which activities to do which wants to avoid.  Follow-up again in 4 to 8 weeks

## 2017-10-26 NOTE — Patient Instructions (Addendum)
Good to see you  4-6 weeeks

## 2017-11-25 NOTE — Progress Notes (Signed)
[name] [address] [place], [place] [address] Phone: [phone] Subjective:    I Christina [name] am serving as a Education administrator for Dr. Hulan Saas.   CC: Back pain  TJQ:[id number]  Christina [name] is a 47 y.o. female coming in with complaint of back pain. States her back is not doing too bad.  Patient went back pain.  States that it is doing a little better than last visit.  Been doing the exercise a little more regularly.  Has been lifting a little better.      Past Medical History:  Diagnosis Date  . Hypercholesteremia   . Hyperthyroidism   . Migraines    with asthma  . Slipping rib syndrome    Past Surgical History:  Procedure Laterality Date  . BREAST BIOPSY Right 06-26-14   benign per patient  . COLONOSCOPY  08/03/2012   in [place] exam,melanosis  . UPPER GASTROINTESTINAL ENDOSCOPY  09/23/2016   Social History   Socioeconomic History  . Marital status: Married    Spouse name: Not on file  . Number of children: 1  . Years of education: 88  . Highest education level: Not on file  Occupational History  . Occupation: Licensed conveyancer Needs  . Financial resource strain: Not on file  . Food insecurity:    Worry: Not on file    Inability: Not on file  . Transportation needs:    Medical: Not on file    Non-medical: Not on file  Tobacco Use  . Smoking status: Never Smoker  . Smokeless tobacco: Never Used  Substance and Sexual Activity  . Alcohol use: Not Currently    Alcohol/week: 0.0 standard drinks    Comment: On occasion   . Drug use: No  . Sexual activity: Yes    Partners: Male    Birth control/protection: Condom  Lifestyle  . Physical activity:    Days per week: Not on file    Minutes per session: Not on file  . Stress: Not on file  Relationships  . Social connections:    Talks on phone: Not on file    Gets together: Not on file    Attends religious service: Not on file    Active member of club or  organization: Not on file    Attends meetings of clubs or organizations: Not on file    Relationship status: Not on file  Other Topics Concern  . Not on file  Social History Narrative   Born and raised in [place].    Currently resides in a house with her child. No pets. Fun: Go to the movies.    Denies religious beliefs effecting health care.    Allergies  Allergen Reactions  . Topamax [Topiramate]     Pain in legs, SOB, and almost passed out   Family History  Problem Relation Age of Onset  . Diabetes Mother   . Hypertension Mother   . Colon cancer Mother 24  . Breast cancer Mother 57  . Prostate cancer Father   . Colon cancer Maternal Aunt   . Colon cancer Maternal Aunt   . Stomach cancer Neg Hx   . Esophageal cancer Neg Hx   . Colon polyps Neg Hx   . Rectal cancer Neg Hx     Current Outpatient Medications (Endocrine & Metabolic):  .  methimazole (TAPAZOLE) 5 MG tablet, Take 1 tablet (5 mg total) by mouth 3 (three) times a week.  Current Outpatient Medications (Hematological):  Marland Kitchen  Iron TABS, Take by mouth. .  vitamin B-12 (CYANOCOBALAMIN) 1000 MCG tablet, Take 1,000 mcg by mouth daily.  Current Outpatient Medications (Other):  Garlan Fillers Peroxide (EAR DROPS OT), Place in ear(s) as needed.  .  cholecalciferol (VITAMIN D) 1000 units tablet, Take 1,000 Units by mouth daily. Marland Kitchen  omeprazole (PRILOSEC) 40 MG capsule, Take 1 capsule (40 mg total) by mouth daily. .  vitamin C (ASCORBIC ACID) 500 MG tablet, Take 500 mg by mouth daily.    Past medical history, social, surgical and family history all reviewed in electronic medical record.  No pertanent information unless stated regarding to the chief complaint.   Review of Systems:  No headache, visual changes, nausea, vomiting, diarrhea, constipation, dizziness, abdominal pain, skin rash, fevers, chills, night sweats, weight loss, swollen lymph nodes, body aches, joint swelling,, chest pain, shortness of breath, mood  changes.  Positive muscle aches  Objective  Blood pressure 122/74, pulse 79, height 5\' 7"  (1.702 m), weight 153 lb (69.4 kg), SpO2 98 %.    General: No apparent distress alert and oriented x3 mood and affect normal, dressed appropriately.  HEENT: Pupils equal, extraocular movements intact  Respiratory: Patient's speak in full sentences and does not appear short of breath  Cardiovascular: No lower extremity edema, non tender, no erythema  Skin: Warm dry intact with no signs of infection or rash on extremities or on axial skeleton.  Abdomen: Soft nontender  Neuro: Cranial nerves II through XII are intact, neurovascularly intact in all extremities with 2+ DTRs and 2+ pulses.  Lymph: No lymphadenopathy of posterior or anterior cervical chain or axillae bilaterally.  Gait normal with good balance and coordination.  MSK:  Non tender with full range of motion and good stability and symmetric strength and tone of shoulders, elbows, wrist, hip, knee and ankles bilaterally.  Back Exam:  Inspection: Loss of lordosis Motion: Flexion 40 deg, Extension 35 deg, Side Bending to 35 deg bilaterally,  Rotation to 45 deg bilaterally  SLR laying: Negative  XSLR laying: Negative  Palpable tenderness: Tender to palpation the paraspinal musculature lumbar spine. FABER: negative. Sensory change: Gross sensation intact to all lumbar and sacral dermatomes.  Reflexes: 2+ at both patellar tendons, 2+ at achilles tendons, Babinski's downgoing.  Strength at foot  Plantar-flexion: 5/5 Dorsi-flexion: 5/5 Eversion: 5/5 Inversion: 5/5  Leg strength  Quad: 5/5 Hamstring: 5/5 Hip flexor: 5/5 Hip abductors: 4/5 but symmetric  Osteopathic findings C2 flexed rotated and side bent right C4 flexed rotated and side bent left T3 extended rotated and side bent right inhaled third rib T9 extended rotated and side bent left L3 flexed rotated and side bent left  Sacrum right on right     Impression and Recommendations:       This case required medical decision making of moderate complexity. The above documentation has been reviewed and is accurate and complete Lyndal Pulley, DO       Note: This dictation was prepared with Dragon dictation along with smaller phrase technology. Any transcriptional errors that result from this process are unintentional.

## 2017-11-28 ENCOUNTER — Encounter: Payer: Self-pay | Admitting: Family Medicine

## 2017-11-28 ENCOUNTER — Ambulatory Visit: Payer: Federal, State, Local not specified - PPO | Admitting: Family Medicine

## 2017-11-28 VITALS — BP 122/74 | HR 79 | Ht 67.0 in | Wt 153.0 lb

## 2017-11-28 DIAGNOSIS — M9901 Segmental and somatic dysfunction of cervical region: Secondary | ICD-10-CM

## 2017-11-28 DIAGNOSIS — M9903 Segmental and somatic dysfunction of lumbar region: Secondary | ICD-10-CM

## 2017-11-28 DIAGNOSIS — M9908 Segmental and somatic dysfunction of rib cage: Secondary | ICD-10-CM | POA: Diagnosis not present

## 2017-11-28 DIAGNOSIS — M533 Sacrococcygeal disorders, not elsewhere classified: Secondary | ICD-10-CM

## 2017-11-28 DIAGNOSIS — M9902 Segmental and somatic dysfunction of thoracic region: Secondary | ICD-10-CM

## 2017-11-28 DIAGNOSIS — M999 Biomechanical lesion, unspecified: Secondary | ICD-10-CM

## 2017-11-28 DIAGNOSIS — M9904 Segmental and somatic dysfunction of sacral region: Secondary | ICD-10-CM

## 2017-11-28 NOTE — Assessment & Plan Note (Signed)
Decision today to treat with OMT was based on Physical Exam  After verbal consent patient was treated with HVLA, ME, FPR techniques in cervical, thoracic, rib, lumbar and sacral areas  Patient tolerated the procedure well with improvement in symptoms  Patient given exercises, stretches and lifestyle modifications  See medications in patient instructions if given  Patient will follow up in 4-6 weeks 

## 2017-11-28 NOTE — Assessment & Plan Note (Signed)
Discussed posture and ergonomics.  Discussed hip abductor strengthening.  Upper back is still seems to have trouble.  Discussed which activities to do which wants to avoid follow-up again in 4 to 6 weeks.

## 2017-11-28 NOTE — Patient Instructions (Signed)
Good to see you  Ice is your friend Stay active Good luck with travel  See me again in 4-6 weeks

## 2017-12-16 ENCOUNTER — Ambulatory Visit: Payer: Self-pay

## 2017-12-16 MED ORDER — ALPRAZOLAM 0.25 MG PO TABS: 0.2500 mg | ORAL_TABLET | Freq: Every day | ORAL | 0 refills | Status: DC | PRN

## 2017-12-16 NOTE — Telephone Encounter (Signed)
Does patient need appt?

## 2017-12-16 NOTE — Telephone Encounter (Signed)
I have sent a prescription for xanax 0.25 to take as needed for anxiety related to flying

## 2017-12-16 NOTE — Telephone Encounter (Signed)
Patient called and says her claustrophobia has gotten worse over the years and is requesting a medication that will help with her anxiety while on a flight early next week, Monday or Tuesday. She says that she was prescribed something for anxiety when she had to have an MRI once. She says if there is something OTC that she can buy that would work, she will go that route. I advised I will send this request to [facility] and someone from the office will call with her recommendation, she verbalized understanding and says a message can be left on her phone, since she's at work and may not answer.  Reason for Disposition . Caller requesting a NON-URGENT new prescription or refill and triager unable to refill per unit policy  Protocols used: MEDICATION QUESTION CALL-A-AH

## 2017-12-19 NOTE — Telephone Encounter (Signed)
Left detailed message informing pt of below.  

## 2018-01-01 NOTE — Progress Notes (Signed)
[name] [address] [place], [place] [address] Phone: [phone] Subjective:    I [name] am serving as a Education administrator for Dr. Hulan Saas.   CC: Back pain follow-up  PYP:[id number]  Christina [name] is a 47 y.o. female coming in with complaint of back pain. States that the back is doing well.  Back pain  Is fairly stable.  Patient now has been working little bit more.  This is usually her busy season.  Patient denies of any radiation of pain, just more tightness     Past Medical History:  Diagnosis Date  . Hypercholesteremia   . Hyperthyroidism   . Migraines    with asthma  . Slipping rib syndrome    Past Surgical History:  Procedure Laterality Date  . BREAST BIOPSY Right 06-26-14   benign per patient  . COLONOSCOPY  08/03/2012   in [place] exam,melanosis  . UPPER GASTROINTESTINAL ENDOSCOPY  09/23/2016   Social History   Socioeconomic History  . Marital status: Married    Spouse name: Not on file  . Number of children: 1  . Years of education: 36  . Highest education level: Not on file  Occupational History  . Occupation: Licensed conveyancer Needs  . Financial resource strain: Not on file  . Food insecurity:    Worry: Not on file    Inability: Not on file  . Transportation needs:    Medical: Not on file    Non-medical: Not on file  Tobacco Use  . Smoking status: Never Smoker  . Smokeless tobacco: Never Used  Substance and Sexual Activity  . Alcohol use: Not Currently    Alcohol/week: 0.0 standard drinks    Comment: On occasion   . Drug use: No  . Sexual activity: Yes    Partners: Male    Birth control/protection: Condom  Lifestyle  . Physical activity:    Days per week: Not on file    Minutes per session: Not on file  . Stress: Not on file  Relationships  . Social connections:    Talks on phone: Not on file    Gets together: Not on file    Attends religious service: Not on file    Active member  of club or organization: Not on file    Attends meetings of clubs or organizations: Not on file    Relationship status: Not on file  Other Topics Concern  . Not on file  Social History Narrative   Born and raised in [place].    Currently resides in a house with her child. No pets. Fun: Go to the movies.    Denies religious beliefs effecting health care.    Allergies  Allergen Reactions  . Topamax [Topiramate]     Pain in legs, SOB, and almost passed out   Family History  Problem Relation Age of Onset  . Diabetes Mother   . Hypertension Mother   . Colon cancer Mother 47  . Breast cancer Mother 37  . Prostate cancer Father   . Colon cancer Maternal Aunt   . Colon cancer Maternal Aunt   . Stomach cancer Neg Hx   . Esophageal cancer Neg Hx   . Colon polyps Neg Hx   . Rectal cancer Neg Hx     Current Outpatient Medications (Endocrine & Metabolic):  .  methimazole (TAPAZOLE) 5 MG tablet, Take 1 tablet (5 mg total) by mouth 3 (three) times a week.  Current Outpatient Medications (Hematological):  Marland Kitchen  Iron TABS, Take by mouth. .  vitamin B-12 (CYANOCOBALAMIN) 1000 MCG tablet, Take 1,000 mcg by mouth daily.  Current Outpatient Medications (Other):  Marland Kitchen  ALPRAZolam (XANAX) 0.25 MG tablet, Take 1 tablet (0.25 mg total) by mouth daily as needed for anxiety. Garlan Fillers Peroxide (EAR DROPS OT), Place in ear(s) as needed.  .  cholecalciferol (VITAMIN D) 1000 units tablet, Take 1,000 Units by mouth daily. Marland Kitchen  omeprazole (PRILOSEC) 40 MG capsule, Take 1 capsule (40 mg total) by mouth daily. .  vitamin C (ASCORBIC ACID) 500 MG tablet, Take 500 mg by mouth daily.    Past medical history, social, surgical and family history all reviewed in electronic medical record.  No pertanent information unless stated regarding to the chief complaint.   Review of Systems:  No headache, visual changes, nausea, vomiting, diarrhea, constipation, dizziness, abdominal pain, skin rash, fevers, chills,  night sweats, weight loss, swollen lymph nodes, body aches, joint swelling,  chest pain, shortness of breath, mood changes.  Positive muscle aches  Objective  Blood pressure 90/60, pulse 72, height 5\' 7"  (1.702 m), weight 149 lb (67.6 kg), SpO2 98 %.   General: No apparent distress alert and oriented x3 mood and affect normal, dressed appropriately.  HEENT: Pupils equal, extraocular movements intact  Respiratory: Patient's speak in full sentences and does not appear short of breath  Cardiovascular: No lower extremity edema, non tender, no erythema  Skin: Warm dry intact with no signs of infection or rash on extremities or on axial skeleton.  Abdomen: Soft nontender  Neuro: Cranial nerves II through XII are intact, neurovascularly intact in all extremities with 2+ DTRs and 2+ pulses.  Lymph: No lymphadenopathy of posterior or anterior cervical chain or axillae bilaterally.  Gait normal with good balance and coordination.  MSK:  Non tender with full range of motion and good stability and symmetric strength and tone of shoulders, elbows, wrist, hip, knee and ankles bilaterally.  Back Exam:  Inspection: Loss of lordosis Motion: Flexion 45 deg, Extension 25 deg, Side Bending to 35 deg bilaterally,  Rotation to 30 deg bilaterally  SLR laying: Negative  XSLR laying: Negative  Palpable tenderness: To palpation the paraspinal musculature lumbar spine right greater than left. FABER: Positive Faber on the right. Sensory change: Gross sensation intact to all lumbar and sacral dermatomes.  Reflexes: 2+ at both patellar tendons, 2+ at achilles tendons, Babinski's downgoing.  Strength at foot  Plantar-flexion: 5/5 Dorsi-flexion: 5/5 Eversion: 5/5 Inversion: 5/5  Leg strength  Quad: 5/5 Hamstring: 5/5 Hip flexor: 5/5 Hip abductors: 5/5  Gait unremarkable.  Osteopathic findings C2 flexed rotated and side bent right T3 extended rotated and side bent right inhaled third rib T6 extended rotated and  side bent left L4 flexed rotated and side bent right Sacrum right on right    Impression and Recommendations:     This case required medical decision making of moderate complexity. The above documentation has been reviewed and is accurate and complete Lyndal Pulley, DO       Note: This dictation was prepared with Dragon dictation along with smaller phrase technology. Any transcriptional errors that result from this process are unintentional.

## 2018-01-02 ENCOUNTER — Ambulatory Visit: Payer: Federal, State, Local not specified - PPO | Admitting: Family Medicine

## 2018-01-02 ENCOUNTER — Encounter: Payer: Self-pay | Admitting: Family Medicine

## 2018-01-02 VITALS — BP 90/60 | HR 72 | Ht 67.0 in | Wt 149.0 lb

## 2018-01-02 DIAGNOSIS — M999 Biomechanical lesion, unspecified: Secondary | ICD-10-CM

## 2018-01-02 DIAGNOSIS — M9903 Segmental and somatic dysfunction of lumbar region: Secondary | ICD-10-CM

## 2018-01-02 DIAGNOSIS — M9908 Segmental and somatic dysfunction of rib cage: Secondary | ICD-10-CM

## 2018-01-02 DIAGNOSIS — M9902 Segmental and somatic dysfunction of thoracic region: Secondary | ICD-10-CM

## 2018-01-02 DIAGNOSIS — M9901 Segmental and somatic dysfunction of cervical region: Secondary | ICD-10-CM

## 2018-01-02 DIAGNOSIS — M5416 Radiculopathy, lumbar region: Secondary | ICD-10-CM | POA: Diagnosis not present

## 2018-01-02 DIAGNOSIS — M9904 Segmental and somatic dysfunction of sacral region: Secondary | ICD-10-CM

## 2018-01-02 NOTE — Assessment & Plan Note (Signed)
Decision today to treat with OMT was based on Physical Exam  After verbal consent patient was treated with HVLA, ME, FPR techniques in cervical, thoracic, rib lumbar and sacral areas  Patient tolerated the procedure well with improvement in symptoms  Patient given exercises, stretches and lifestyle modifications  See medications in patient instructions if given  Patient will follow up in 4-8 weeks 

## 2018-01-02 NOTE — Assessment & Plan Note (Signed)
No radicular symptoms at this time.  Responding well to osteopathic manipulation.  We discussed posture and ergonomics again.  Discussed proper lifting mechanics.  We discussed recovery being important with patient working more hours at the moment.  Patient will follow-up with me again in 4 to 8 weeks.

## 2018-01-02 NOTE — Patient Instructions (Signed)
Good to see you  Ice is your friend Overall doing well  See me again in 4 weeks

## 2018-01-04 DIAGNOSIS — K080 Exfoliation of teeth due to systemic causes: Secondary | ICD-10-CM | POA: Diagnosis not present

## 2018-02-01 ENCOUNTER — Encounter: Payer: Self-pay | Admitting: Nurse Practitioner

## 2018-02-01 ENCOUNTER — Ambulatory Visit: Payer: Self-pay | Admitting: *Deleted

## 2018-02-01 ENCOUNTER — Ambulatory Visit: Payer: Federal, State, Local not specified - PPO | Admitting: Nurse Practitioner

## 2018-02-01 VITALS — BP 102/72 | HR 76 | Temp 98.5°F | Ht 67.0 in | Wt 148.2 lb

## 2018-02-01 DIAGNOSIS — R42 Dizziness and giddiness: Secondary | ICD-10-CM

## 2018-02-01 DIAGNOSIS — H8112 Benign paroxysmal vertigo, left ear: Secondary | ICD-10-CM

## 2018-02-01 DIAGNOSIS — R110 Nausea: Secondary | ICD-10-CM

## 2018-02-01 MED ORDER — ONDANSETRON HCL 4 MG PO TABS: 4.0000 mg | ORAL_TABLET | Freq: Three times a day (TID) | ORAL | 0 refills | Status: DC | PRN

## 2018-02-01 MED ORDER — MECLIZINE HCL 12.5 MG PO TABS: 12.5000 mg | ORAL_TABLET | Freq: Two times a day (BID) | ORAL | 1 refills | Status: DC | PRN

## 2018-02-01 MED ORDER — PROMETHAZINE HCL 25 MG/ML IJ SOLN
25.0000 mg | Freq: Once | INTRAMUSCULAR | Status: AC
Start: 2018-02-01 — End: 2018-02-01
  Administered 2018-02-01: 25 mg via INTRAMUSCULAR

## 2018-02-01 NOTE — Progress Notes (Signed)
Subjective:  Patient ID: Christina [name]. Christina [name], female    DOB: 1970/09/12  Age: 48 y.o. MRN: [id number]  CC: Dizziness (pt c/o of dizziness on exertion,better when stay still, hx of vertigo, going 1 day. )  accompanied by husband  Dizziness  This is a recurrent problem. The current episode started yesterday. The problem occurs constantly. The problem has been unchanged. Associated symptoms include nausea and vertigo. Pertinent negatives include no anorexia, chills, congestion, coughing, fatigue, fever, headaches, myalgias, neck pain, numbness, urinary symptoms, visual change, vomiting or weakness. Exacerbated by: head movement. She has tried rest for the symptoms.   Reviewed past Medical, Social and Family history today.  Outpatient Medications Prior to Visit  Medication Sig Dispense Refill  . Carbamide Peroxide (EAR DROPS OT) Place in ear(s) as needed.     . cholecalciferol (VITAMIN D) 1000 units tablet Take 1,000 Units by mouth daily.    . Iron TABS Take by mouth.    . methimazole (TAPAZOLE) 5 MG tablet Take 1 tablet (5 mg total) by mouth 3 (three) times a week. 13 tablet 5  . omeprazole (PRILOSEC) 40 MG capsule Take 1 capsule (40 mg total) by mouth daily. 30 capsule 11  . vitamin B-12 (CYANOCOBALAMIN) 1000 MCG tablet Take 1,000 mcg by mouth daily.    . vitamin C (ASCORBIC ACID) 500 MG tablet Take 500 mg by mouth daily.    Marland Kitchen ALPRAZolam (XANAX) 0.25 MG tablet Take 1 tablet (0.25 mg total) by mouth daily as needed for anxiety. (Patient not taking: Reported on 02/01/2018) 5 tablet 0   No facility-administered medications prior to visit.     ROS See HPI  Objective:  BP 102/72   Pulse 76   Temp 98.5 F (36.9 C) (Oral)   Ht 5\' 7"  (1.702 m)   Wt 148 lb 3.2 oz (67.2 kg)   SpO2 98%   BMI 23.21 kg/m   BP Readings from Last 3 Encounters:  02/01/18 102/72  01/02/18 90/60  11/28/17 122/74    Wt Readings from Last 3 Encounters:  02/01/18 148 lb 3.2 oz (67.2 kg)  01/02/18 149 lb (67.6 kg)   11/28/17 153 lb (69.4 kg)    Physical Exam Vitals reviewed: positive nystagmus to left.  HENT:     Right Ear: Tympanic membrane, ear canal and external ear normal.     Left Ear: Tympanic membrane, ear canal and external ear normal.  Eyes:     Extraocular Movements: Extraocular movements intact.     Conjunctiva/sclera: Conjunctivae normal.     Pupils: Pupils are equal, round, and reactive to light.  Cardiovascular:     Rate and Rhythm: Normal rate and regular rhythm.     Pulses: Normal pulses.     Heart sounds: Normal heart sounds.  Pulmonary:     Effort: Pulmonary effort is normal.     Breath sounds: Normal breath sounds.  Musculoskeletal:     Right lower leg: No edema.     Left lower leg: No edema.  Neurological:     Mental Status: She is alert and oriented to person, place, and time.  Psychiatric:        Mood and Affect: Mood normal.        Behavior: Behavior normal.        Thought Content: Thought content normal.     Lab Results  Component Value Date   WBC 5.2 03/08/2017   HGB 14.1 03/08/2017   HCT 43.2 03/08/2017   PLT 213.0 03/08/2017  GLUCOSE 103 (H) 08/24/2016   CHOL 136 03/08/2017   TRIG 49.0 03/08/2017   HDL 33.80 (L) 03/08/2017   LDLCALC 93 03/08/2017   ALT 10 08/24/2016   AST 14 08/24/2016   NA 138 08/24/2016   K 3.8 08/24/2016   CL 106 08/24/2016   CREATININE 1.06 08/24/2016   BUN 13 08/24/2016   CO2 25 08/24/2016   TSH 2.41 09/14/2017   HGBA1C 5.4 05/27/2014    US Breast Ltd Uni Left Inc Axilla  Result Date: 07/29/2017 CLINICAL DATA:  Possible mass in the upper-outer periareolar region of the left breast on a recent screening mammogram. The patient's mother was diagnosed with breast cancer in her 64's. EXAM: DIGITAL DIAGNOSTIC LEFT MAMMOGRAM WITH TOMO ULTRASOUND LEFT BREAST COMPARISON:  Previous exam(s). ACR Breast Density Category d: The breast tissue is extremely dense, which lowers the sensitivity of mammography. FINDINGS: 3D tomographic and  2D generated spot compression views of the left breast demonstrate an oval mass in the upper-outer retroareolar region of the breast. This has some circumscribed and some indistinct or obscured margins. Targeted ultrasound is performed, showing a 1.5 cm cyst containing a single thin internal septation in the 1 o'clock position of the left breast, 1 cm from the nipple. No internal blood flow was seen with power Doppler. IMPRESSION: Benign left breast cyst.  No evidence of malignancy. RECOMMENDATION: Bilateral screening mammogram in 1 year. I have discussed the findings and recommendations with the patient. Results were also provided in writing at the conclusion of the visit. If applicable, a reminder letter will be sent to the patient regarding the next appointment. BI-RADS CATEGORY  2: Benign. Electronically Signed   By: Claudie Revering M.D.   On: 07/29/2017 12:47   Mm Diag Breast Tomo Uni Left  Result Date: 07/29/2017 CLINICAL DATA:  Possible mass in the upper-outer periareolar region of the left breast on a recent screening mammogram. The patient's mother was diagnosed with breast cancer in her 56's. EXAM: DIGITAL DIAGNOSTIC LEFT MAMMOGRAM WITH TOMO ULTRASOUND LEFT BREAST COMPARISON:  Previous exam(s). ACR Breast Density Category d: The breast tissue is extremely dense, which lowers the sensitivity of mammography. FINDINGS: 3D tomographic and 2D generated spot compression views of the left breast demonstrate an oval mass in the upper-outer retroareolar region of the breast. This has some circumscribed and some indistinct or obscured margins. Targeted ultrasound is performed, showing a 1.5 cm cyst containing a single thin internal septation in the 1 o'clock position of the left breast, 1 cm from the nipple. No internal blood flow was seen with power Doppler. IMPRESSION: Benign left breast cyst.  No evidence of malignancy. RECOMMENDATION: Bilateral screening mammogram in 1 year. I have discussed the findings and  recommendations with the patient. Results were also provided in writing at the conclusion of the visit. If applicable, a reminder letter will be sent to the patient regarding the next appointment. BI-RADS CATEGORY  2: Benign. Electronically Signed   By: Claudie Revering M.D.   On: 07/29/2017 12:47    Assessment & Plan:   Christina [name] was seen today for dizziness.  Diagnoses and all orders for this visit:  Vertigo -     meclizine (ANTIVERT) 12.5 MG tablet; Take 1 tablet (12.5 mg total) by mouth 2 (two) times daily as needed for dizziness (do not use for more than 3days). -     ondansetron (ZOFRAN) 4 MG tablet; Take 1 tablet (4 mg total) by mouth every 8 (eight) hours as needed for nausea  or vomiting. -     promethazine (PHENERGAN) injection 25 mg  Nausea -     promethazine (PHENERGAN) injection 25 mg   I am having Christina [name] start on meclizine and ondansetron. I am also having her maintain her Carbamide Peroxide (EAR DROPS OT), vitamin B-12, Iron, cholecalciferol, vitamin C, omeprazole, methimazole, and ALPRAZolam. We administered promethazine.  Meds ordered this encounter  Medications  . meclizine (ANTIVERT) 12.5 MG tablet    Sig: Take 1 tablet (12.5 mg total) by mouth 2 (two) times daily as needed for dizziness (do not use for more than 3days).    Dispense:  6 tablet    Refill:  1    Order Specific Question:   Supervising Provider    Answer:   Lucille Passy [[id number]]  . ondansetron (ZOFRAN) 4 MG tablet    Sig: Take 1 tablet (4 mg total) by mouth every 8 (eight) hours as needed for nausea or vomiting.    Dispense:  20 tablet    Refill:  0    Order Specific Question:   Supervising Provider    Answer:   Lucille Passy [[id number]]  . promethazine (PHENERGAN) injection 25 mg    Problem List Items Addressed This Visit    None    Visit Diagnoses    Vertigo    -  Primary   Relevant Medications   meclizine (ANTIVERT) 12.5 MG tablet   ondansetron (ZOFRAN) 4 MG tablet   promethazine (PHENERGAN)  injection 25 mg (Completed)   Nausea       Relevant Medications   promethazine (PHENERGAN) injection 25 mg (Completed)       Follow-up: No follow-ups on file.  Wilfred Lacy, NP

## 2018-02-01 NOTE — Progress Notes (Deleted)
[name] [address] [place], [place] [address] Phone: [phone] Subjective:    I'm seeing this patient by the request  of:    CC:   [id number]  Christina [name] is a 48 y.o. female coming in with complaint of ***  Onset-  Location Duration-  Character- Aggravating factors- Reliving factors-  Therapies tried-  Severity-     Past Medical History:  Diagnosis Date  . Hypercholesteremia   . Hyperthyroidism   . Migraines    with asthma  . Slipping rib syndrome    Past Surgical History:  Procedure Laterality Date  . BREAST BIOPSY Right 06-26-14   benign per patient  . COLONOSCOPY  08/03/2012   in [place] exam,melanosis  . UPPER GASTROINTESTINAL ENDOSCOPY  09/23/2016   Social History   Socioeconomic History  . Marital status: Married    Spouse name: Not on file  . Number of children: 1  . Years of education: 61  . Highest education level: Not on file  Occupational History  . Occupation: Licensed conveyancer Needs  . Financial resource strain: Not on file  . Food insecurity:    Worry: Not on file    Inability: Not on file  . Transportation needs:    Medical: Not on file    Non-medical: Not on file  Tobacco Use  . Smoking status: Never Smoker  . Smokeless tobacco: Never Used  Substance and Sexual Activity  . Alcohol use: Not Currently    Alcohol/week: 0.0 standard drinks    Comment: On occasion   . Drug use: No  . Sexual activity: Yes    Partners: Male    Birth control/protection: Condom  Lifestyle  . Physical activity:    Days per week: Not on file    Minutes per session: Not on file  . Stress: Not on file  Relationships  . Social connections:    Talks on phone: Not on file    Gets together: Not on file    Attends religious service: Not on file    Active member of club or organization: Not on file    Attends meetings of clubs or organizations: Not on file    Relationship status: Not on file    Other Topics Concern  . Not on file  Social History Narrative   Born and raised in [place].    Currently resides in a house with her child. No pets. Fun: Go to the movies.    Denies religious beliefs effecting health care.    Allergies  Allergen Reactions  . Topamax [Topiramate]     Pain in legs, SOB, and almost passed out   Family History  Problem Relation Age of Onset  . Diabetes Mother   . Hypertension Mother   . Colon cancer Mother 27  . Breast cancer Mother 77  . Prostate cancer Father   . Colon cancer Maternal Aunt   . Colon cancer Maternal Aunt   . Stomach cancer Neg Hx   . Esophageal cancer Neg Hx   . Colon polyps Neg Hx   . Rectal cancer Neg Hx     Current Outpatient Medications (Endocrine & Metabolic):  .  methimazole (TAPAZOLE) 5 MG tablet, Take 1 tablet (5 mg total) by mouth 3 (three) times a week.     Current Outpatient Medications (Hematological):  Marland Kitchen  Iron TABS, Take by mouth. .  vitamin B-12 (CYANOCOBALAMIN) 1000 MCG tablet, Take 1,000 mcg by mouth daily.  Current Outpatient Medications (Other):  Marland Kitchen  ALPRAZolam (XANAX) 0.25 MG tablet, Take 1 tablet (0.25 mg total) by mouth daily as needed for anxiety. Garlan Fillers Peroxide (EAR DROPS OT), Place in ear(s) as needed.  .  cholecalciferol (VITAMIN D) 1000 units tablet, Take 1,000 Units by mouth daily. Marland Kitchen  omeprazole (PRILOSEC) 40 MG capsule, Take 1 capsule (40 mg total) by mouth daily. .  vitamin C (ASCORBIC ACID) 500 MG tablet, Take 500 mg by mouth daily.    Past medical history, social, surgical and family history all reviewed in electronic medical record.  No pertanent information unless stated regarding to the chief complaint.   Review of Systems:  No headache, visual changes, nausea, vomiting, diarrhea, constipation, dizziness, abdominal pain, skin rash, fevers, chills, night sweats, weight loss, swollen lymph nodes, body aches, joint swelling, muscle aches, chest pain, shortness of breath, mood  changes.   Objective  There were no vitals taken for this visit. Systems examined below as of    General: No apparent distress alert and oriented x3 mood and affect normal, dressed appropriately.  HEENT: Pupils equal, extraocular movements intact  Respiratory: Patient's speak in full sentences and does not appear short of breath  Cardiovascular: No lower extremity edema, non tender, no erythema  Skin: Warm dry intact with no signs of infection or rash on extremities or on axial skeleton.  Abdomen: Soft nontender  Neuro: Cranial nerves II through XII are intact, neurovascularly intact in all extremities with 2+ DTRs and 2+ pulses.  Lymph: No lymphadenopathy of posterior or anterior cervical chain or axillae bilaterally.  Gait normal with good balance and coordination.  MSK:  Non tender with full range of motion and good stability and symmetric strength and tone of shoulders, elbows, wrist, hip, knee and ankles bilaterally.     Impression and Recommendations:     This case required medical decision making of moderate complexity. The above documentation has been reviewed and is accurate and complete Lyndal Pulley, DO       Note: This dictation was prepared with Dragon dictation along with smaller phrase technology. Any transcriptional errors that result from this process are unintentional.

## 2018-02-01 NOTE — Telephone Encounter (Signed)
   Reason for Disposition . [1] MODERATE dizziness (e.g., vertigo; feels very unsteady, interferes with normal activities) AND [2] has been evaluated by physician for this    Patient has had vertigo before- it has been a while since she has had symptoms. She request appointment today- no appointment available at PCP.  Answer Assessment - Initial Assessment Questions 1. DESCRIPTION: "Describe your dizziness."     spinning 2. VERTIGO: "Do you feel like either you or the room is spinning or tilting?"      Room is spinning 3. LIGHTHEADED: "Do you feel lightheaded?" (e.g., somewhat faint, woozy, weak upon standing)     Bending over makes it worse- getting out of bed is bad too 4. SEVERITY: "How bad is it?"  "Can you walk?"   - MILD - Feels unsteady but walking normally.   - MODERATE - Feels very unsteady when walking, but not falling; interferes with normal activities (e.g., school, work) .   - SEVERE - Unable to walk without falling (requires assistance).     moderate 5. ONSET:  "When did the dizziness begin?"     Started yesterday- patient feels it from time to time 6. AGGRAVATING FACTORS: "Does anything make it worse?" (e.g., standing, change in head position)     Bending, getting up from laying 7. CAUSE: "What do you think is causing the dizziness?"     Vertigo in the past- very similar 8. RECURRENT SYMPTOM: "Have you had dizziness before?" If so, ask: "When was the last time?" "What happened that time?"     Vertigo in the past- she was treated in the past 9. OTHER SYMPTOMS: "Do you have any other symptoms?" (e.g., headache, weakness, numbness, vomiting, earache)     no 10. PREGNANCY: "Is there any chance you are pregnant?" "When was your last menstrual period?"       Yes- LMP- 01/26/18  Protocols used: DIZZINESS - VERTIGO-A-AH

## 2018-02-01 NOTE — Patient Instructions (Addendum)
Do not take meclizine if sleepy or drowsy after promethazine injection.  Do vertigo exercise once a day x 1week.  Benign Positional Vertigo Vertigo is the feeling that you or your surroundings are moving when they are not. Benign positional vertigo is the most common form of vertigo. This is usually a harmless condition (benign). This condition is positional. This means that symptoms are triggered by certain movements and positions. This condition can be dangerous if it occurs while you are doing something that could cause harm to you or others. This includes activities such as driving or operating machinery. What are the causes? In many cases, the cause of this condition is not known. It may be caused by a disturbance in an area of the inner ear that helps your brain to sense movement and balance. This disturbance can be caused by:  Viral infection (labyrinthitis).  Head injury.  Repetitive motion, such as jumping, dancing, or running. What increases the risk? You are more likely to develop this condition if:  You are a woman.  You are 35 years of age or older. What are the signs or symptoms? Symptoms of this condition usually happen when you move your head or your eyes in different directions. Symptoms may start suddenly, and usually last for less than a minute. They include:  Loss of balance and falling.  Feeling like you are spinning or moving.  Feeling like your surroundings are spinning or moving.  Nausea and vomiting.  Blurred vision.  Dizziness.  Involuntary eye movement (nystagmus). Symptoms can be mild and cause only minor problems, or they can be severe and interfere with daily life. Episodes of benign positional vertigo may return (recur) over time. Symptoms may improve over time. How is this diagnosed? This condition may be diagnosed based on:  Your medical history.  Physical exam of the head, neck, and ears.  Tests, such as: ? MRI. ? CT scan. ? Eye  movement tests. Your health care provider may ask you to change positions quickly while he or she watches you for symptoms of benign positional vertigo, such as nystagmus. Eye movement may be tested with a variety of exams that are designed to evaluate or stimulate vertigo. ? An electroencephalogram (EEG). This records electrical activity in your brain. ? Hearing tests. You may be referred to a health care provider who specializes in ear, nose, and throat (ENT) problems (otolaryngologist) or a provider who specializes in disorders of the nervous system (neurologist). How is this treated?  This condition may be treated in a session in which your health care provider moves your head in specific positions to adjust your inner ear back to normal. Treatment for this condition may take several sessions. Surgery may be needed in severe cases, but this is rare. In some cases, benign positional vertigo may resolve on its own in 2-4 weeks. Follow these instructions at home: Safety  Move slowly. Avoid sudden body or head movements or certain positions, as told by your health care provider.  Avoid driving until your health care provider says it is safe for you to do so.  Avoid operating heavy machinery until your health care provider says it is safe for you to do so.  Avoid doing any tasks that would be dangerous to you or others if vertigo occurs.  If you have trouble walking or keeping your balance, try using a cane for stability. If you feel dizzy or unstable, sit down right away.  Return to your normal activities as told  by your health care provider. Ask your health care provider what activities are safe for you. General instructions  Take over-the-counter and prescription medicines only as told by your health care provider.  Drink enough fluid to keep your urine pale yellow.  Keep all follow-up visits as told by your health care provider. This is important. Contact a health care provider  if:  You have a fever.  Your condition gets worse or you develop new symptoms.  Your family or friends notice any behavioral changes.  You have nausea or vomiting that gets worse.  You have numbness or a "pins and needles" sensation. Get help right away if you:  Have difficulty speaking or moving.  Are always dizzy.  Faint.  Develop severe headaches.  Have weakness in your legs or arms.  Have changes in your hearing or vision.  Develop a stiff neck.  Develop sensitivity to light. Summary  Vertigo is the feeling that you or your surroundings are moving when they are not. Benign positional vertigo is the most common form of vertigo.  The cause of this condition is not known. It may be caused by a disturbance in an area of the inner ear that helps your brain to sense movement and balance.  Symptoms include loss of balance and falling, feeling that you or your surroundings are moving, nausea and vomiting, and blurred vision.  This condition can be diagnosed based on symptoms, physical exam, and other tests, such as MRI, CT scan, eye movement tests, and hearing tests.  Follow safety instructions as told by your health care provider. You will also be told when to contact your health care provider in case of problems. This information is not intended to replace advice given to you by your health care provider. Make sure you discuss any questions you have with your health care provider. Document Released: 10/19/2005 Document Revised: 06/22/2017 Document Reviewed: 06/22/2017 Elsevier Interactive Patient Education  2019 [employer].

## 2018-02-02 ENCOUNTER — Ambulatory Visit: Payer: Federal, State, Local not specified - PPO | Admitting: Family Medicine

## 2018-02-20 NOTE — Progress Notes (Signed)
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  UPDATE: She hasn't been taking ASA recently due to acid reflux.   She has been migraine-free.  No auras.    HISTORY: On 03/10/14, she developed sudden onset of vision loss in the left eye. Over the next 30 minutes, she developed clumsiness and heaviness of her right arm and hand. There was no speech or language disturbance. This lasted a couple of hours. Several hours later, she developed a severe 8-9/10 nonthrobbing left sided headache. It was associated with nausea, photophobia and phonophobia. 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 MRI of brain performed on 03/11/14 was normal. She was advised to start ASA 81mg  daily. She received a cocktail, which eased the pain. The headache persisted without aura symptoms at an intensity of 5/10. Since it did not completely resolve, she returned to the ED about a week later. She received an Imitrex injection which caused the left vision loss aura, which persisted off an on for a few days. The headache persisted for 2 weeks before it completely resolved. She did not have the focal loss of fine-motor skills in the right hand.   She had other episodes presenting with left sided weakness as well. She has had left sided symptoms involving the arm and leg, presenting as constant feeling of weakness, tingling in the wrist or ankle, and generalized aches. Symptoms fluctuate during the day. She denies neck pain or pain shooting down the arm or leg. She also described an episode where she heard crackling in her ears, followed by flashing lights  lasting 10 seconds.   She has had occasional recurrent episodes of headache without the visual aura and focal weakness. It is bi-frontal and throbbing, about 5/10. It is associated with photophobia and phonophobia, but not nausea. She will take naproxen 500mg  and cup of coffee which eases the pain. It will last a couple of days.  Triggers:  Nuts, lemons, dried fruit, pizza.  Prior medications: topiramate (dizziness)  She has no prior history of migraine. Her father had migraines. There is no family history of first degree relatives with stroke  Since one of the episodes involved transient left vision loss with right sided weakness, and the left ICA showed a stenosis of 50%, referral to vascular surgery was recommended. However, she had subsequent ED visits where she presented with left sided weakness, so I felt her symptoms were not related to the left ICA and vascular consult was cancelled.    Repeat carotid doppler from 02/19/15 showed less than 50% bilateral ICA stenosis.  Due to numbness and tingling, labs were ordered.  B12 was 226 and methylmalonic acid level was 1.2.  She was advised to start B12 1040mcg daily.  She also has been taking B6 and vitamin D.  Paresthesias have improved.    PAST MEDICAL HISTORY: Past Medical History:  Diagnosis Date  . Hypercholesteremia   . Hyperthyroidism   . Migraines    with asthma  . Slipping rib syndrome     MEDICATIONS: Current Outpatient Medications on File Prior to Visit  Medication Sig Dispense Refill  . ALPRAZolam (XANAX) 0.25 MG tablet Take 1 tablet (0.25 mg  total) by mouth daily as needed for anxiety. (Patient not taking: Reported on 02/01/2018) 5 tablet 0  . Carbamide Peroxide (EAR DROPS OT) Place in ear(s) as needed.     . cholecalciferol (VITAMIN D) 1000 units tablet Take 1,000 Units by mouth daily.    . Iron TABS Take by mouth.    . meclizine (ANTIVERT) 12.5 MG tablet Take 1 tablet (12.5 mg total) by mouth 2 (two)  times daily as needed for dizziness (do not use for more than 3days). 6 tablet 1  . methimazole (TAPAZOLE) 5 MG tablet Take 1 tablet (5 mg total) by mouth 3 (three) times a week. 13 tablet 5  . omeprazole (PRILOSEC) 40 MG capsule Take 1 capsule (40 mg total) by mouth daily. 30 capsule 11  . ondansetron (ZOFRAN) 4 MG tablet Take 1 tablet (4 mg total) by mouth every 8 (eight) hours as needed for nausea or vomiting. 20 tablet 0  . vitamin B-12 (CYANOCOBALAMIN) 1000 MCG tablet Take 1,000 mcg by mouth daily.    . vitamin C (ASCORBIC ACID) 500 MG tablet Take 500 mg by mouth daily.     No current facility-administered medications on file prior to visit.     ALLERGIES: Allergies  Allergen Reactions  . Topamax [Topiramate]     Pain in legs, SOB, and almost passed out    FAMILY HISTORY: Family History  Problem Relation Age of Onset  . Diabetes Mother   . Hypertension Mother   . Colon cancer Mother 83  . Breast cancer Mother 71  . Prostate cancer Father   . Colon cancer Maternal Aunt   . Colon cancer Maternal Aunt   . Stomach cancer Neg Hx   . Esophageal cancer Neg Hx   . Colon polyps Neg Hx   . Rectal cancer Neg Hx    SOCIAL HISTORY: Social History   Socioeconomic History  . Marital status: Married    Spouse name: Not on file  . Number of children: 1  . Years of education: 54  . Highest education level: Not on file  Occupational History  . Occupation: Licensed conveyancer Needs  . Financial resource strain: Not on file  . Food insecurity:    Worry: Not on file    Inability: Not on file  . Transportation needs:    Medical: Not on file    Non-medical: Not on file  Tobacco Use  . Smoking status: Never Smoker  . Smokeless tobacco: Never Used  Substance and Sexual Activity  . Alcohol use: Not Currently    Alcohol/week: 0.0 standard drinks    Comment: On occasion   . Drug use: No  . Sexual activity: Yes    Partners: Male    Birth control/protection: Condom  Lifestyle    . Physical activity:    Days per week: Not on file    Minutes per session: Not on file  . Stress: Not on file  Relationships  . Social connections:    Talks on phone: Not on file    Gets together: Not on file    Attends religious service: Not on file    Active member of club or organization: Not on file    Attends meetings of clubs or organizations: Not on file    Relationship status: Not on file  . Intimate partner violence:    Fear of current or ex partner: Not on file    Emotionally abused: Not on file    Physically abused: Not on  file    Forced sexual activity: Not on file  Other Topics Concern  . Not on file  Social History Narrative   Born and raised in [place].    Currently resides in a house with her child. No pets. Fun: Go to the movies.    Denies religious beliefs effecting health care.     REVIEW OF SYSTEMS: Constitutional: No fevers, chills, or sweats, no generalized fatigue, change in appetite Eyes: No visual changes, double vision, eye pain Ear, nose and throat: No hearing loss, ear pain, nasal congestion, sore throat Cardiovascular: No chest pain, palpitations Respiratory:  No shortness of breath at rest or with exertion, wheezes GastrointestinaI: No nausea, vomiting, diarrhea, abdominal pain, fecal incontinence Genitourinary:  No dysuria, urinary retention or frequency Musculoskeletal:  No neck pain, back pain Integumentary: No rash, pruritus, skin lesions Neurological: as above Psychiatric: No depression, insomnia, anxiety Endocrine: No palpitations, fatigue, diaphoresis, mood swings, change in appetite, change in weight, increased thirst Hematologic/Lymphatic:  No purpura, petechiae. Allergic/Immunologic: no itchy/runny eyes, nasal congestion, recent allergic reactions, rashes  PHYSICAL EXAM: Blood pressure 98/78, pulse 80, height 5\' 7"  (1.702 m), weight 151 lb (68.5 kg), SpO2 98 %. General: No acute distress.  Patient appears well-groomed.   Head:   Normocephalic/atraumatic Eyes:  Fundi examined but not visualized Neck: supple, no paraspinal tenderness, full range of motion Heart:  Regular rate and rhythm Lungs:  Clear to auscultation bilaterally Back: No paraspinal tenderness Neurological Exam: alert and oriented to person, place, and time. Attention span and concentration intact, recent and remote memory intact, fund of knowledge intact.  Speech fluent and not dysarthric, language intact.  CN II-XII intact. Bulk and tone normal, muscle strength 5/5 throughout.  Sensation to light touch, temperature and vibration intact.  Deep tendon reflexes 2+ throughout, toes downgoing.  Finger to nose and heel to shin testing intact.  Gait normal, Romberg negative.  IMPRESSION: 1.  Hemiplegic migraine 2.  Migraine with visual aura, without status migrainosus, not intractable  She has not had any migraine headaches, hemiplegic migraines or migraine aura for at least a year.  I don't think she needs to be on ASA as these are not stroke events and carotid doppler revealed no hemodynamically significant stenosis.  Follow up as needed.  Metta Clines, DO  CC: Caesar Chestnut, NP

## 2018-02-21 ENCOUNTER — Encounter: Payer: Self-pay | Admitting: Neurology

## 2018-02-21 ENCOUNTER — Ambulatory Visit: Payer: Federal, State, Local not specified - PPO | Admitting: Neurology

## 2018-02-21 VITALS — BP 98/78 | HR 80 | Ht 67.0 in | Wt 151.0 lb

## 2018-02-21 DIAGNOSIS — G43409 Hemiplegic migraine, not intractable, without status migrainosus: Secondary | ICD-10-CM

## 2018-02-21 DIAGNOSIS — G43109 Migraine with aura, not intractable, without status migrainosus: Secondary | ICD-10-CM | POA: Diagnosis not present

## 2018-02-21 NOTE — Patient Instructions (Signed)
Follow up as needed

## 2018-03-06 ENCOUNTER — Other Ambulatory Visit: Payer: Self-pay

## 2018-03-06 ENCOUNTER — Telehealth: Payer: Self-pay | Admitting: Endocrinology

## 2018-03-06 MED ORDER — METHIMAZOLE 5 MG PO TABS: 5.0000 mg | ORAL_TABLET | ORAL | 5 refills | Status: DC

## 2018-03-06 NOTE — Telephone Encounter (Signed)
MEDICATION: methimazole (TAPAZOLE) 5 MG tablet  PHARMACY:  [facility], [place] - [address]  IS THIS A 90 DAY SUPPLY :  30 day  IS PATIENT OUT OF MEDICATION: no  IF NOT; HOW MUCH IS LEFT:  She has one left  LAST APPOINTMENT DATE: @8 /20/2019  NEXT APPOINTMENT DATE:@2 /25/2020  DO WE HAVE YOUR PERMISSION TO LEAVE A DETAILED MESSAGE:  OTHER COMMENTS:    **Let patient know to contact pharmacy at the end of the day to make sure medication is ready. **  ** Please notify patient to allow 48-72 hours to process**  **Encourage patient to contact the pharmacy for refills or they can request refills through [facility]**

## 2018-03-21 ENCOUNTER — Ambulatory Visit: Payer: Federal, State, Local not specified - PPO | Admitting: Endocrinology

## 2018-03-21 ENCOUNTER — Encounter: Payer: Self-pay | Admitting: Endocrinology

## 2018-03-21 ENCOUNTER — Other Ambulatory Visit: Payer: Self-pay

## 2018-03-21 VITALS — BP 102/62 | HR 83 | Ht 67.0 in | Wt 153.0 lb

## 2018-03-21 DIAGNOSIS — E0590 Thyrotoxicosis, unspecified without thyrotoxic crisis or storm: Secondary | ICD-10-CM | POA: Diagnosis not present

## 2018-03-21 LAB — TSH: TSH: 5.09 u[IU]/mL — AB (ref 0.35–4.50)

## 2018-03-21 LAB — T4, FREE: FREE T4: 0.79 ng/dL (ref 0.60–1.60)

## 2018-03-21 MED ORDER — METHIMAZOLE 5 MG PO TABS: 2.5000 mg | ORAL_TABLET | ORAL | 5 refills | Status: DC

## 2018-03-21 NOTE — Progress Notes (Signed)
Subjective:    Patient ID: Christina [name]. [name], female    DOB: 07-29-1970, 48 y.o.   MRN: [id number]  HPI Pt returns for f/u of hyperthyroidism (dx'ed in early 2015, in [place]; nuc med scan showed diffuse uptake (45% at 24 hrs); [place] in 2017 showed diffuse goiter, with multiple very small nodules; pt is unaware why tapazole was chosen as initial rx, but she wishes to continue). she takes tapazole as rx'ed.  pt states she feels well in general.    Past Medical History:  Diagnosis Date  . Hypercholesteremia   . Hyperthyroidism   . Migraines    with asthma  . Slipping rib syndrome     Past Surgical History:  Procedure Laterality Date  . BREAST BIOPSY Right 06-26-14   benign per patient  . COLONOSCOPY  08/03/2012   in [place] exam,melanosis  . UPPER GASTROINTESTINAL ENDOSCOPY  09/23/2016    Social History   Socioeconomic History  . Marital status: Married    Spouse name: Not on file  . Number of children: 1  . Years of education: 24  . Highest education level: Not on file  Occupational History  . Occupation: Licensed conveyancer Needs  . Financial resource strain: Not on file  . Food insecurity:    Worry: Not on file    Inability: Not on file  . Transportation needs:    Medical: Not on file    Non-medical: Not on file  Tobacco Use  . Smoking status: Never Smoker  . Smokeless tobacco: Never Used  Substance and Sexual Activity  . Alcohol use: Not Currently    Alcohol/week: 0.0 standard drinks    Comment: On occasion   . Drug use: No  . Sexual activity: Yes    Partners: Male    Birth control/protection: Condom  Lifestyle  . Physical activity:    Days per week: Not on file    Minutes per session: Not on file  . Stress: Not on file  Relationships  . Social connections:    Talks on phone: Not on file    Gets together: Not on file    Attends religious service: Not on file    Active member of club or organization: Not on file    Attends meetings of clubs  or organizations: Not on file    Relationship status: Not on file  . Intimate partner violence:    Fear of current or ex partner: Not on file    Emotionally abused: Not on file    Physically abused: Not on file    Forced sexual activity: Not on file  Other Topics Concern  . Not on file  Social History Narrative   Born and raised in [place].    Currently resides in a house with her child. No pets. Fun: Go to the movies.    Denies religious beliefs effecting health care.     Current Outpatient Medications on File Prior to Visit  Medication Sig Dispense Refill  . Carbamide Peroxide (EAR DROPS OT) Place in ear(s) as needed.     . cholecalciferol (VITAMIN D) 1000 units tablet Take 1,000 Units by mouth daily.    . Iron TABS Take by mouth.    . vitamin B-12 (CYANOCOBALAMIN) 1000 MCG tablet Take 1,000 mcg by mouth daily.    . vitamin C (ASCORBIC ACID) 500 MG tablet Take 500 mg by mouth daily.     No current facility-administered medications on file prior to visit.  Allergies  Allergen Reactions  . Topamax [Topiramate]     Pain in legs, SOB, and almost passed out    Family History  Problem Relation Age of Onset  . Diabetes Mother   . Hypertension Mother   . Colon cancer Mother 76  . Breast cancer Mother 50  . Prostate cancer Father   . Colon cancer Maternal Aunt   . Colon cancer Maternal Aunt   . Stomach cancer Neg Hx   . Esophageal cancer Neg Hx   . Colon polyps Neg Hx   . Rectal cancer Neg Hx     BP 102/62 (BP Location: Right Arm, Patient Position: Sitting, Cuff Size: Normal)   Pulse 83   Ht 5\' 7"  (1.702 m)   Wt 153 lb (69.4 kg)   SpO2 95%   BMI 23.96 kg/m    Review of Systems Denies fever    Objective:   Physical Exam VITAL SIGNS:  See vs page GENERAL: no distress NECK: There is no palpable thyroid enlargement.  No thyroid nodule is palpable.  No palpable lymphadenopathy at the anterior neck.    Lab Results  Component Value Date   TSH 5.09 (H)  03/21/2018      Assessment & Plan:  Hyperthyroidism: overcontrolled.  I have sent a prescription to your pharmacy, to reduce the tapazole.

## 2018-03-21 NOTE — Patient Instructions (Signed)
A thyroid blood test is requested for you today.  We'll let you know about the results.   If ever you have fever while taking methimazole, stop it and call us, even if the reason is obvious, because of the risk of a rare side-effect.   Please come back for a follow-up appointment in 6 months.   

## 2018-05-22 ENCOUNTER — Ambulatory Visit (INDEPENDENT_AMBULATORY_CARE_PROVIDER_SITE_OTHER): Payer: Federal, State, Local not specified - PPO | Admitting: Endocrinology

## 2018-05-22 ENCOUNTER — Other Ambulatory Visit: Payer: Self-pay

## 2018-05-22 ENCOUNTER — Encounter: Payer: Self-pay | Admitting: Endocrinology

## 2018-05-22 DIAGNOSIS — E0590 Thyrotoxicosis, unspecified without thyrotoxic crisis or storm: Secondary | ICD-10-CM

## 2018-05-22 NOTE — Progress Notes (Addendum)
Subjective:    Patient ID: Christina [name]. Christina [name], female    DOB: 1970-11-28, 48 y.o.   MRN: [id number]  HPI  telehealth visit today via doxy video visit.  Alternatives to telehealth are presented to this patient, and the patient agrees to the telehealth visit. Pt is advised of the cost of the visit, and agrees to this, also.   Patient is at home, and I am at the office.   Persons attending the telehealth visit: the patient and I Pt returns for f/u of hyperthyroidism (dx'ed in early 2015, in [place]; nuc med scan showed diffuse uptake (45% at 24 hrs); [place] in 2017 showed diffuse goiter, with multiple very small nodules; pt is unaware why tapazole was chosen as initial rx, but she wishes to continue). she takes tapazole as rx'ed.  pt states she feels well in general, except for fatigue.   Past Medical History:  Diagnosis Date  . Hypercholesteremia   . Hyperthyroidism   . Migraines    with asthma  . Slipping rib syndrome     Past Surgical History:  Procedure Laterality Date  . BREAST BIOPSY Right 06-26-14   benign per patient  . COLONOSCOPY  08/03/2012   in [place] exam,melanosis  . UPPER GASTROINTESTINAL ENDOSCOPY  09/23/2016    Social History   Socioeconomic History  . Marital status: Married    Spouse name: Not on file  . Number of children: 1  . Years of education: 22  . Highest education level: Not on file  Occupational History  . Occupation: Licensed conveyancer Needs  . Financial resource strain: Not on file  . Food insecurity:    Worry: Not on file    Inability: Not on file  . Transportation needs:    Medical: Not on file    Non-medical: Not on file  Tobacco Use  . Smoking status: Never Smoker  . Smokeless tobacco: Never Used  Substance and Sexual Activity  . Alcohol use: Not Currently    Alcohol/week: 0.0 standard drinks    Comment: On occasion   . Drug use: No  . Sexual activity: Yes    Partners: Male    Birth control/protection: Condom   Lifestyle  . Physical activity:    Days per week: Not on file    Minutes per session: Not on file  . Stress: Not on file  Relationships  . Social connections:    Talks on phone: Not on file    Gets together: Not on file    Attends religious service: Not on file    Active member of club or organization: Not on file    Attends meetings of clubs or organizations: Not on file    Relationship status: Not on file  . Intimate partner violence:    Fear of current or ex partner: Not on file    Emotionally abused: Not on file    Physically abused: Not on file    Forced sexual activity: Not on file  Other Topics Concern  . Not on file  Social History Narrative   Born and raised in [place].    Currently resides in a house with her child. No pets. Fun: Go to the movies.    Denies religious beliefs effecting health care.     Current Outpatient Medications on File Prior to Visit  Medication Sig Dispense Refill  . Carbamide Peroxide (EAR DROPS OT) Place in ear(s) as needed.     . cholecalciferol (VITAMIN D) 1000 units  tablet Take 1,000 Units by mouth daily.    . Iron TABS Take by mouth.    . methimazole (TAPAZOLE) 5 MG tablet Take 0.5 tablets (2.5 mg total) by mouth 3 (three) times a week. 10 tablet 5  . vitamin B-12 (CYANOCOBALAMIN) 1000 MCG tablet Take 1,000 mcg by mouth daily.    . vitamin C (ASCORBIC ACID) 500 MG tablet Take 500 mg by mouth daily.     No current facility-administered medications on file prior to visit.     Allergies  Allergen Reactions  . Topamax [Topiramate]     Pain in legs, SOB, and almost passed out    Family History  Problem Relation Age of Onset  . Diabetes Mother   . Hypertension Mother   . Colon cancer Mother 36  . Breast cancer Mother 78  . Prostate cancer Father   . Colon cancer Maternal Aunt   . Colon cancer Maternal Aunt   . Stomach cancer Neg Hx   . Esophageal cancer Neg Hx   . Colon polyps Neg Hx   . Rectal cancer Neg Hx      Review of  Systems Denies fever.      Objective:   Physical Exam   Lab Results  Component Value Date   TSH 4.48 05/23/2018      Assessment & Plan:  Hyperthyroidism: well-controlled.  Please continue the same medication Fatigue: not thyroid-related

## 2018-05-22 NOTE — Patient Instructions (Signed)
A thyroid blood test is requested for you today.  We'll let you know about the results.   If ever you have fever while taking methimazole, stop it and call us, even if the reason is obvious, because of the risk of a rare side-effect.   Please come back for a follow-up appointment in 6 months.

## 2018-05-23 ENCOUNTER — Other Ambulatory Visit (INDEPENDENT_AMBULATORY_CARE_PROVIDER_SITE_OTHER): Payer: Federal, State, Local not specified - PPO

## 2018-05-23 DIAGNOSIS — E0590 Thyrotoxicosis, unspecified without thyrotoxic crisis or storm: Secondary | ICD-10-CM

## 2018-05-23 LAB — TSH: TSH: 4.48 u[IU]/mL (ref 0.35–4.50)

## 2018-05-23 LAB — T4, FREE: Free T4: 0.75 ng/dL (ref 0.60–1.60)

## 2018-06-13 NOTE — Addendum Note (Signed)
Addended by: Renato Shin on: 06/13/2018 07:22 PM   Modules accepted: Level of Service

## 2018-08-01 ENCOUNTER — Other Ambulatory Visit: Payer: Self-pay | Admitting: Certified Nurse Midwife

## 2018-08-01 DIAGNOSIS — Z1231 Encounter for screening mammogram for malignant neoplasm of breast: Secondary | ICD-10-CM

## 2018-08-29 ENCOUNTER — Ambulatory Visit (INDEPENDENT_AMBULATORY_CARE_PROVIDER_SITE_OTHER): Payer: Federal, State, Local not specified - PPO | Admitting: Certified Nurse Midwife

## 2018-08-29 ENCOUNTER — Telehealth: Payer: Self-pay | Admitting: *Deleted

## 2018-08-29 ENCOUNTER — Other Ambulatory Visit (HOSPITAL_COMMUNITY)
Admission: RE | Admit: 2018-08-29 | Discharge: 2018-08-29 | Disposition: A | Discharge status: Home / Self Care | Payer: Federal, State, Local not specified - PPO | Source: Ambulatory Visit | Attending: Certified Nurse Midwife | Admitting: Certified Nurse Midwife

## 2018-08-29 ENCOUNTER — Encounter: Payer: Self-pay | Admitting: Certified Nurse Midwife

## 2018-08-29 ENCOUNTER — Other Ambulatory Visit: Payer: Self-pay

## 2018-08-29 VITALS — BP 114/70 | HR 64 | Temp 97.2°F | Resp 16 | Ht 66.75 in | Wt 149.0 lb

## 2018-08-29 DIAGNOSIS — Z124 Encounter for screening for malignant neoplasm of cervix: Secondary | ICD-10-CM

## 2018-08-29 DIAGNOSIS — E559 Vitamin D deficiency, unspecified: Secondary | ICD-10-CM | POA: Diagnosis not present

## 2018-08-29 DIAGNOSIS — Z0000 Encounter for general adult medical examination without abnormal findings: Secondary | ICD-10-CM

## 2018-08-29 DIAGNOSIS — N6320 Unspecified lump in the left breast, unspecified quadrant: Secondary | ICD-10-CM

## 2018-08-29 DIAGNOSIS — Z01411 Encounter for gynecological examination (general) (routine) with abnormal findings: Secondary | ICD-10-CM | POA: Diagnosis not present

## 2018-08-29 NOTE — Telephone Encounter (Signed)
-----   Message from Regina Eck, CNM sent at 08/29/2018  3:38 PM EDT ----- Left breast mass noted see note. Patient aware she will be called with information regarding diagnostic mammogram and [place]. She has appointment for mammogram already scheduled. Not sure of date.

## 2018-08-29 NOTE — Progress Notes (Signed)
48 y.o. [id number] Married  African American Fe here for annual exam.  Periods normal, no issues. No changes since last visit, less duration of period only. No vasomotor symptoms of menopause or other issues. Contraception condoms, not consistent, no concerns for pregnancy. Fasted for labs today. Has mammogram scheduled in 2 weeks. No other health issues today. Coping well emotionally with the Covid 19 changes.  Patient's last menstrual period was 08/15/2018 (exact date).          Sexually active: Yes.    The current method of family planning is condoms sometimes.    Exercising: Yes.    walking Smoker:  no  Review of Systems  Constitutional: Negative.   HENT: Negative.   Eyes: Negative.   Respiratory: Negative.   Cardiovascular: Negative.   Gastrointestinal: Negative.   Genitourinary: Negative.   Musculoskeletal: Negative.   Skin: Negative.   Neurological: Negative.   Endo/Heme/Allergies: Negative.   Psychiatric/Behavioral: Negative.     Health Maintenance: Pap:  07-10-14 neg HPV HR neg, 07-29-16 neg History of Abnormal Pap: no MMG:  7/19 bilateral & left breast u/s birads 2:neg Self Breast exams: yes Colonoscopy:  2019 BMD:   none TDaP:  2016 Shingles: no Pneumonia: no Hep C and HIV: 2015 both neg per patient  Labs: yes   reports that she has never smoked. She has never used smokeless tobacco. She reports previous alcohol use. She reports that she does not use drugs.  Past Medical History:  Diagnosis Date  . Hypercholesteremia   . Hyperthyroidism   . Migraines    with asthma  . Slipping rib syndrome     Past Surgical History:  Procedure Laterality Date  . BREAST BIOPSY Right 06-26-14   benign per patient  . COLONOSCOPY  08/03/2012   in [place] exam,melanosis  . UPPER GASTROINTESTINAL ENDOSCOPY  09/23/2016    Current Outpatient Medications  Medication Sig Dispense Refill  . Carbamide Peroxide (EAR DROPS OT) Place in ear(s) as needed.     .  cholecalciferol (VITAMIN D) 1000 units tablet Take 1,000 Units by mouth daily.    . methimazole (TAPAZOLE) 5 MG tablet Take 0.5 tablets (2.5 mg total) by mouth 3 (three) times a week. 10 tablet 5  . vitamin B-12 (CYANOCOBALAMIN) 1000 MCG tablet Take 1,000 mcg by mouth daily.    . vitamin C (ASCORBIC ACID) 500 MG tablet Take 500 mg by mouth daily.    . Iron TABS Take by mouth.     No current facility-administered medications for this visit.     Family History  Problem Relation Age of Onset  . Diabetes Mother   . Hypertension Mother   . Colon cancer Mother 45  . Breast cancer Mother 22  . Prostate cancer Father   . Colon cancer Maternal Aunt   . Colon cancer Maternal Aunt   . Stomach cancer Neg Hx   . Esophageal cancer Neg Hx   . Colon polyps Neg Hx   . Rectal cancer Neg Hx     ROS:  Pertinent items are noted in HPI.  Otherwise, a comprehensive ROS was negative.  Exam:   BP 114/70   Pulse 64   Temp (!) 97.2 F (36.2 C) (Skin)   Resp 16   Ht 5' 6.75" (1.695 m)   Wt 149 lb (67.6 kg)   LMP 08/15/2018 (Exact Date)   BMI 23.51 kg/m  Height: 5' 6.75" (169.5 cm) Ht Readings from Last 3 Encounters:  08/29/18 5' 6.75" (1.695 m)  03/21/18 5\' 7"  (1.702 m)  02/21/18 5\' 7"  (1.702 m)    General appearance: alert, cooperative and appears stated age Head: Normocephalic, without obvious abnormality, atraumatic Neck: no adenopathy, supple, symmetrical, trachea midline and thyroid normal to inspection and palpation Lungs: clear to auscultation bilaterally Breasts: normal appearance, no masses or tenderness, No nipple retraction or dimpling, No nipple discharge or bleeding, No axillary or supraclavicular adenopathy, left breast mass noted at 3 o'clock at edge of areola, mobile 1-2 cm cystic feel, non tender Heart: regular rate and rhythm Abdomen: soft, non-tender; no masses,  no organomegaly Extremities: extremities normal, atraumatic, no cyanosis or edema Skin: Skin color, texture,  turgor normal. No rashes or lesions Lymph nodes: Cervical, supraclavicular, and axillary nodes normal. No abnormal inguinal nodes palpated Neurologic: Grossly normal Physical Exam Chest:        Pelvic: External genitalia:  no lesions              Urethra:  normal appearing urethra with no masses, tenderness or lesions              Bartholin's and Skene's: normal                 Vagina: normal appearing vagina with normal color and discharge, no lesions              Cervix: multiparous appearance, no cervical motion tenderness and no lesions              Pap taken: Yes.   Bimanual Exam:  Uterus:  normal size, contour, position, consistency, mobility, non-tender and mid position              Adnexa: normal adnexa and no mass, fullness, tenderness               Rectovaginal: Confirms               Anus:  normal sphincter tone, no lesions  Chaperone present: yes  A:  Well Woman with normal exam  Contraception condoms not consistent  Left breast mass history of cysts in past.  Fasting labs    P:   Reviewed health and wellness pertinent to exam  Discussed perimenopausal changes which can occur with periods. Questions addressed.  Discussed finding in left breast. Patient has had cysts before and feels like to her, but had not noted on breast exam. Will need diagnostic mammogram and [place]. She will be called with mammogram appointment information.  Screening labs: CMP, CBC, TSH, Lipid panel, Vitamin D  Pap smear: yes   counseled on breast self exam, mammography screening, feminine hygiene, adequate intake of calcium and vitamin D, diet and exercise  return annually or prn  An After Visit Summary was printed and given to the patient.

## 2018-08-29 NOTE — Telephone Encounter (Signed)
Order placed for bilateral Dx MMG and left breast US, if needed, at [facility].

## 2018-08-30 LAB — TSH: TSH: 2.79 u[IU]/mL (ref 0.450–4.500)

## 2018-08-30 LAB — COMPREHENSIVE METABOLIC PANEL
ALT: 10 IU/L (ref 0–32)
AST: 12 IU/L (ref 0–40)
Albumin/Globulin Ratio: 1.7 (ref 1.2–2.2)
Albumin: 4.2 g/dL (ref 3.8–4.8)
Alkaline Phosphatase: 38 IU/L — ABNORMAL LOW (ref 39–117)
BUN/Creatinine Ratio: 12 (ref 9–23)
BUN: 11 mg/dL (ref 6–24)
Bilirubin Total: 0.6 mg/dL (ref 0.0–1.2)
CO2: 25 mmol/L (ref 20–29)
Calcium: 8.6 mg/dL — ABNORMAL LOW (ref 8.7–10.2)
Chloride: 103 mmol/L (ref 96–106)
Creatinine, Ser: 0.93 mg/dL (ref 0.57–1.00)
GFR calc Af Amer: 83 mL/min/{1.73_m2} (ref >59)
GFR calc non Af Amer: 72 mL/min/{1.73_m2} (ref >59)
Globulin, Total: 2.5 g/dL (ref 1.5–4.5)
Glucose: 83 mg/dL (ref 65–99)
Potassium: 4.2 mmol/L (ref 3.5–5.2)
Sodium: 138 mmol/L (ref 134–144)
Total Protein: 6.7 g/dL (ref 6.0–8.5)

## 2018-08-30 LAB — LIPID PANEL
Chol/HDL Ratio: 3.6 ratio (ref 0.0–4.4)
Cholesterol, Total: 131 mg/dL (ref 100–199)
HDL: 36 mg/dL — ABNORMAL LOW (ref >39)
LDL Calculated: 87 mg/dL (ref 0–99)
Triglycerides: 39 mg/dL (ref 0–149)
VLDL Cholesterol Cal: 8 mg/dL (ref 5–40)

## 2018-08-30 LAB — CBC
Hematocrit: 40.5 % (ref 34.0–46.6)
Hemoglobin: 13.1 g/dL (ref 11.1–15.9)
MCH: 28.2 pg (ref 26.6–33.0)
MCHC: 32.3 g/dL (ref 31.5–35.7)
MCV: 87 fL (ref 79–97)
Platelets: 206 10*3/uL (ref 150–450)
RBC: 4.65 x10E6/uL (ref 3.77–5.28)
RDW: 12 % (ref 11.7–15.4)
WBC: 3.7 10*3/uL (ref 3.4–10.8)

## 2018-08-30 LAB — VITAMIN D 25 HYDROXY (VIT D DEFICIENCY, FRACTURES): Vit D, 25-Hydroxy: 18.7 ng/mL — ABNORMAL LOW (ref 30.0–100.0)

## 2018-08-30 NOTE — Telephone Encounter (Signed)
Spoke with Christina [name] at [facility]. Bilateral Dx MMG and left breast US scheduled for 8/11 at 11:10am, arrive at 10:50am. Screening cancelled for 09/20/18.

## 2018-08-30 NOTE — Telephone Encounter (Signed)
Left detailed message, ok per dpr. Advised of appt at [facility] as seen below. If chnages need to be made to appt contact [facility] directly at [phone], if any additional assistance return call to office.   Routing to [employer], CNM FYI.   Encounter closed.

## 2018-08-31 LAB — CYTOLOGY - PAP
Diagnosis: NEGATIVE
HPV: NOT DETECTED

## 2018-09-01 ENCOUNTER — Telehealth: Payer: Self-pay

## 2018-09-01 DIAGNOSIS — E559 Vitamin D deficiency, unspecified: Secondary | ICD-10-CM

## 2018-09-01 MED ORDER — VITAMIN D (ERGOCALCIFEROL) 1.25 MG (50000 UNIT) PO CAPS: 50000.0000 [IU] | ORAL_CAPSULE | ORAL | 0 refills | Status: DC

## 2018-09-01 NOTE — Telephone Encounter (Signed)
Patient notified of results as written by provider. Lab appt scheduled & rx sent to pharmacy.

## 2018-09-01 NOTE — Telephone Encounter (Signed)
Left message for call back.

## 2018-09-01 NOTE — Telephone Encounter (Signed)
Patient is returning a call to Joy. °

## 2018-09-01 NOTE — Telephone Encounter (Signed)
-----   Message from Regina Eck, CNM sent at 09/01/2018 10:12 AM EDT ----- Notify patient that her pap smear is negative, HPV not detected. 02 Yeast  Noted on pap smear. She can use OTC Monistat cream every hs x 7.

## 2018-09-04 DIAGNOSIS — Z20828 Contact with and (suspected) exposure to other viral communicable diseases: Secondary | ICD-10-CM | POA: Diagnosis not present

## 2018-09-05 ENCOUNTER — Other Ambulatory Visit: Payer: Federal, State, Local not specified - PPO

## 2018-09-14 ENCOUNTER — Ambulatory Visit
Admission: RE | Admit: 2018-09-14 | Discharge: 2018-09-14 | Disposition: A | Discharge status: Home / Self Care | Payer: Federal, State, Local not specified - PPO | Source: Ambulatory Visit | Attending: Certified Nurse Midwife | Admitting: Certified Nurse Midwife

## 2018-09-14 ENCOUNTER — Other Ambulatory Visit: Payer: Self-pay | Admitting: Certified Nurse Midwife

## 2018-09-14 ENCOUNTER — Other Ambulatory Visit: Payer: Self-pay

## 2018-09-14 DIAGNOSIS — N6320 Unspecified lump in the left breast, unspecified quadrant: Secondary | ICD-10-CM

## 2018-09-14 DIAGNOSIS — N6310 Unspecified lump in the right breast, unspecified quadrant: Secondary | ICD-10-CM

## 2018-09-18 ENCOUNTER — Ambulatory Visit: Payer: Federal, State, Local not specified - PPO | Admitting: Endocrinology

## 2018-09-20 ENCOUNTER — Ambulatory Visit: Payer: Federal, State, Local not specified - PPO

## 2018-11-13 ENCOUNTER — Ambulatory Visit: Payer: Federal, State, Local not specified - PPO | Admitting: Endocrinology

## 2018-11-13 ENCOUNTER — Other Ambulatory Visit: Payer: Self-pay

## 2018-11-13 ENCOUNTER — Encounter: Payer: Self-pay | Admitting: Endocrinology

## 2018-11-13 VITALS — BP 100/62 | HR 88 | Ht 66.75 in | Wt 147.4 lb

## 2018-11-13 DIAGNOSIS — E0590 Thyrotoxicosis, unspecified without thyrotoxic crisis or storm: Secondary | ICD-10-CM | POA: Diagnosis not present

## 2018-11-13 LAB — TSH: TSH: 3.99 u[IU]/mL (ref 0.35–4.50)

## 2018-11-13 LAB — T4, FREE: Free T4: 0.89 ng/dL (ref 0.60–1.60)

## 2018-11-13 NOTE — Patient Instructions (Addendum)
Blood tests are requested for you today.  We'll let you know about the results.  If ever you have fever while taking methimazole, stop it and call us, even if the reason is obvious, because of the risk of a rare side-effect.  Please come back for a follow-up appointment in 6 months.   

## 2018-11-13 NOTE — Progress Notes (Signed)
Subjective:    Patient ID: Christina [name]. [name], female    DOB: 1970/11/11, 48 y.o.   MRN: [id number]  HPI Pt returns for f/u of hyperthyroidism (dx'ed in early 2015, in [place]; nuc med scan showed diffuse uptake (45% at 24 hrs); [place] in 2017 showed diffuse goiter, with multiple very small nodules; pt is unaware why tapazole was chosen as initial rx, but she wishes to continue). she takes tapazole as rx'ed.  pt reports diffuse hair loss on the head, and hoarseness.   Past Medical History:  Diagnosis Date  . Hypercholesteremia   . Hyperthyroidism   . Migraines    with asthma  . Slipping rib syndrome     Past Surgical History:  Procedure Laterality Date  . BREAST BIOPSY Right 06-26-14   benign per patient  . COLONOSCOPY  08/03/2012   in [place] exam,melanosis  . UPPER GASTROINTESTINAL ENDOSCOPY  09/23/2016    Social History   Socioeconomic History  . Marital status: Married    Spouse name: Not on file  . Number of children: 1  . Years of education: 24  . Highest education level: Not on file  Occupational History  . Occupation: Licensed conveyancer Needs  . Financial resource strain: Not on file  . Food insecurity    Worry: Not on file    Inability: Not on file  . Transportation needs    Medical: Not on file    Non-medical: Not on file  Tobacco Use  . Smoking status: Never Smoker  . Smokeless tobacco: Never Used  Substance and Sexual Activity  . Alcohol use: Not Currently    Alcohol/week: 0.0 standard drinks  . Drug use: No  . Sexual activity: Yes    Partners: Male    Birth control/protection: Condom  Lifestyle  . Physical activity    Days per week: Not on file    Minutes per session: Not on file  . Stress: Not on file  Relationships  . Social Herbalist on phone: Not on file    Gets together: Not on file    Attends religious service: Not on file    Active member of club or organization: Not on file    Attends meetings of clubs or  organizations: Not on file    Relationship status: Not on file  . Intimate partner violence    Fear of current or ex partner: Not on file    Emotionally abused: Not on file    Physically abused: Not on file    Forced sexual activity: Not on file  Other Topics Concern  . Not on file  Social History Narrative   Born and raised in [place].    Currently resides in a house with her child. No pets. Fun: Go to the movies.    Denies religious beliefs effecting health care.     Current Outpatient Medications on File Prior to Visit  Medication Sig Dispense Refill  . Carbamide Peroxide (EAR DROPS OT) Place in ear(s) as needed.     . cholecalciferol (VITAMIN D) 1000 units tablet Take 1,000 Units by mouth daily.    . Iron TABS Take by mouth.    . methimazole (TAPAZOLE) 5 MG tablet Take 0.5 tablets (2.5 mg total) by mouth 3 (three) times a week. 10 tablet 5  . vitamin B-12 (CYANOCOBALAMIN) 1000 MCG tablet Take 1,000 mcg by mouth daily.    . vitamin C (ASCORBIC ACID) 500 MG tablet Take 500 mg  by mouth daily.    . Vitamin D, Ergocalciferol, (DRISDOL) 1.25 MG (50000 UT) CAPS capsule Take 1 capsule (50,000 Units total) by mouth every 7 (seven) days. 12 capsule 0   No current facility-administered medications on file prior to visit.     Allergies  Allergen Reactions  . Topamax [Topiramate]     Pain in legs, SOB, and almost passed out    Family History  Problem Relation Age of Onset  . Diabetes Mother   . Hypertension Mother   . Colon cancer Mother 58  . Breast cancer Mother 35  . Prostate cancer Father   . Colon cancer Maternal Aunt   . Colon cancer Maternal Aunt   . Stomach cancer Neg Hx   . Esophageal cancer Neg Hx   . Colon polyps Neg Hx   . Rectal cancer Neg Hx     BP 100/62 (BP Location: Right Arm, Patient Position: Sitting, Cuff Size: Normal)   Pulse 88   Ht 5' 6.75" (1.695 m)   Wt 147 lb 6.4 oz (66.9 kg)   SpO2 96%   BMI 23.26 kg/m   Review of Systems Denies fever and  cold intolerance.      Objective:   Physical Exam VITAL SIGNS:  See vs page GENERAL: no distress NECK: There is no palpable thyroid enlargement.  No thyroid nodule is palpable.  No palpable lymphadenopathy at the anterior neck.   Lab Results  Component Value Date   TSH 3.99 11/13/2018      Assessment & Plan:  Hyperthyroidism: well-controlled.  Hair loss and other sxs: not thyroid-related.   Patient Instructions  Blood tests are requested for you today.  We'll let you know about the results.   If ever you have fever while taking methimazole, stop it and call us, even if the reason is obvious, because of the risk of a rare side-effect.   Please come back for a follow-up appointment in 6 months.

## 2018-12-06 ENCOUNTER — Other Ambulatory Visit: Payer: Self-pay

## 2018-12-06 ENCOUNTER — Other Ambulatory Visit (INDEPENDENT_AMBULATORY_CARE_PROVIDER_SITE_OTHER): Payer: Federal, State, Local not specified - PPO

## 2018-12-06 DIAGNOSIS — E559 Vitamin D deficiency, unspecified: Secondary | ICD-10-CM | POA: Diagnosis not present

## 2018-12-07 LAB — VITAMIN D 25 HYDROXY (VIT D DEFICIENCY, FRACTURES): Vit D, 25-Hydroxy: 37.7 ng/mL (ref 30.0–100.0)

## 2019-02-20 ENCOUNTER — Other Ambulatory Visit: Payer: Self-pay | Admitting: Endocrinology

## 2019-04-11 ENCOUNTER — Telehealth: Payer: Self-pay | Admitting: Endocrinology

## 2019-04-11 NOTE — Telephone Encounter (Signed)
Please advise. You asked pt to return in 6 months. At the time of this entry, did not see a current appt.

## 2019-04-11 NOTE — Telephone Encounter (Signed)
MEDICATION: methimazole  PHARMACY:   [facility], [address] Phone:  [phone]  Fax:  [phone]     IS THIS A 90 DAY SUPPLY : no - 30 day  IS PATIENT OUT OF MEDICATION: yes  IF NOT; HOW MUCH IS LEFT:   LAST APPOINTMENT DATE: 11/13/2018  NEXT APPOINTMENT DATE: 05/15/2019  DO WE HAVE YOUR PERMISSION TO LEAVE A DETAILED MESSAGE: yes  OTHER COMMENTS:    **Let patient know to contact pharmacy at the end of the day to make sure medication is ready. **  ** Please notify patient to allow 48-72 hours to process**  **Encourage patient to contact the pharmacy for refills or they can request refills through [facility]**

## 2019-04-12 ENCOUNTER — Other Ambulatory Visit: Payer: Self-pay

## 2019-04-12 DIAGNOSIS — E0590 Thyrotoxicosis, unspecified without thyrotoxic crisis or storm: Secondary | ICD-10-CM

## 2019-04-12 MED ORDER — METHIMAZOLE 5 MG PO TABS: 2.5000 mg | ORAL_TABLET | ORAL | 1 refills | Status: DC

## 2019-04-12 NOTE — Telephone Encounter (Signed)
Please refer to Dr. Ellison's response below 

## 2019-04-12 NOTE — Telephone Encounter (Signed)
1.  Please schedule f/u appt 2.  Then please refill x 1, pending that appt.  

## 2019-04-16 ENCOUNTER — Encounter: Payer: Self-pay | Admitting: Certified Nurse Midwife

## 2019-05-15 ENCOUNTER — Ambulatory Visit: Payer: Federal, State, Local not specified - PPO | Admitting: Endocrinology

## 2019-05-15 ENCOUNTER — Encounter: Payer: Self-pay | Admitting: Endocrinology

## 2019-05-15 ENCOUNTER — Other Ambulatory Visit: Payer: Self-pay

## 2019-05-15 VITALS — BP 110/80 | HR 80 | Ht 66.75 in | Wt 152.0 lb

## 2019-05-15 DIAGNOSIS — E0590 Thyrotoxicosis, unspecified without thyrotoxic crisis or storm: Secondary | ICD-10-CM | POA: Diagnosis not present

## 2019-05-15 LAB — TSH: TSH: 3.46 u[IU]/mL (ref 0.35–4.50)

## 2019-05-15 LAB — T4, FREE: Free T4: 0.81 ng/dL (ref 0.60–1.60)

## 2019-05-15 NOTE — Progress Notes (Signed)
Subjective:    Patient ID: Christina [name]. Christina [name], female    DOB: 10-12-1970, 49 y.o.   MRN: [id number]  HPI Pt returns for f/u of hyperthyroidism (dx'ed in early 2015, in [place]; nuc med scan showed diffuse uptake (45% at 24 hrs); [place] in 2017 showed diffuse goiter, with multiple very small nodules; pt is unaware why tapazole was chosen as initial rx, but she wishes to continue). she takes tapazole as rx'ed.  pt states she feels well in general. Past Medical History:  Diagnosis Date  . Hypercholesteremia   . Hyperthyroidism   . Migraines    with asthma  . Slipping rib syndrome     Past Surgical History:  Procedure Laterality Date  . BREAST BIOPSY Right 06-26-14   benign per patient  . COLONOSCOPY  08/03/2012   in [place] exam,melanosis  . UPPER GASTROINTESTINAL ENDOSCOPY  09/23/2016    Social History   Socioeconomic History  . Marital status: Married    Spouse name: Not on file  . Number of children: 1  . Years of education: 83  . Highest education level: Not on file  Occupational History  . Occupation: Geologist, engineering  Tobacco Use  . Smoking status: Never Smoker  . Smokeless tobacco: Never Used  Substance and Sexual Activity  . Alcohol use: Not Currently    Alcohol/week: 0.0 standard drinks  . Drug use: No  . Sexual activity: Yes    Partners: Male    Birth control/protection: Condom  Other Topics Concern  . Not on file  Social History Narrative   Born and raised in [place].    Currently resides in a house with her child. No pets. Fun: Go to the movies.    Denies religious beliefs effecting health care.    Social Determinants of Health   Financial Resource Strain:   . Difficulty of Paying Living Expenses:   Food Insecurity:   . Worried About Charity fundraiser in the Last Year:   . Arboriculturist in the Last Year:   Transportation Needs:   . Film/video editor (Medical):   Marland Kitchen Lack of Transportation (Non-Medical):   Physical Activity:   . Days  of Exercise per Week:   . Minutes of Exercise per Session:   Stress:   . Feeling of Stress :   Social Connections:   . Frequency of Communication with Friends and Family:   . Frequency of Social Gatherings with Friends and Family:   . Attends Religious Services:   . Active Member of Clubs or Organizations:   . Attends Archivist Meetings:   Marland Kitchen Marital Status:   Intimate Partner Violence:   . Fear of Current or Ex-Partner:   . Emotionally Abused:   Marland Kitchen Physically Abused:   . Sexually Abused:     Current Outpatient Medications on File Prior to Visit  Medication Sig Dispense Refill  . Carbamide Peroxide (EAR DROPS OT) Place in ear(s) as needed.     . cholecalciferol (VITAMIN D) 1000 units tablet Take 1,000 Units by mouth daily.    . Iron TABS Take by mouth.    . methimazole (TAPAZOLE) 5 MG tablet Take 0.5 tablets (2.5 mg total) by mouth 3 (three) times a week. 10 tablet 1  . vitamin B-12 (CYANOCOBALAMIN) 1000 MCG tablet Take 1,000 mcg by mouth daily.    . vitamin C (ASCORBIC ACID) 500 MG tablet Take 500 mg by mouth daily.    . Vitamin D, Ergocalciferol, (DRISDOL)  1.25 MG (50000 UT) CAPS capsule Take 1 capsule (50,000 Units total) by mouth every 7 (seven) days. 12 capsule 0   No current facility-administered medications on file prior to visit.    Allergies  Allergen Reactions  . Topamax [Topiramate]     Pain in legs, SOB, and almost passed out    Family History  Problem Relation Age of Onset  . Diabetes Mother   . Hypertension Mother   . Colon cancer Mother 67  . Breast cancer Mother 50  . Prostate cancer Father   . Colon cancer Maternal Aunt   . Colon cancer Maternal Aunt   . Stomach cancer Neg Hx   . Esophageal cancer Neg Hx   . Colon polyps Neg Hx   . Rectal cancer Neg Hx     BP 110/80   Pulse 80   Ht 5' 6.75" (1.695 m)   Wt 152 lb (68.9 kg)   SpO2 98%   BMI 23.99 kg/m    Review of Systems She denies hypoglycemia.      Objective:   Physical  Exam VITAL SIGNS:  See vs page GENERAL: no distress NECK: There is no palpable thyroid enlargement.  No thyroid nodule is palpable.  No palpable lymphadenopathy at the anterior neck.     Lab Results  Component Value Date   TSH 3.46 05/15/2019      Assessment & Plan:  Hyperthyroidism: well-controlled.  Please continue the same medication  Patient Instructions  Blood tests are requested for you today.  We'll let you know about the results.  If ever you have fever while taking methimazole, stop it and call us, even if the reason is obvious, because of the risk of a rare side-effect. It is best to never miss the medication.  However, if you do miss it, next best is to double up the next time.   Please come back for a follow-up appointment in 6 months

## 2019-05-15 NOTE — Patient Instructions (Signed)
Blood tests are requested for you today.  We'll let you know about the results.  If ever you have fever while taking methimazole, stop it and call us, even if the reason is obvious, because of the risk of a rare side-effect. It is best to never miss the medication.  However, if you do miss it, next best is to double up the next time.   Please come back for a follow-up appointment in 6 months.   

## 2019-05-16 DIAGNOSIS — Z20822 Contact with and (suspected) exposure to covid-19: Secondary | ICD-10-CM | POA: Diagnosis not present

## 2019-05-16 DIAGNOSIS — Z03818 Encounter for observation for suspected exposure to other biological agents ruled out: Secondary | ICD-10-CM | POA: Diagnosis not present

## 2019-05-16 DIAGNOSIS — Z20828 Contact with and (suspected) exposure to other viral communicable diseases: Secondary | ICD-10-CM | POA: Diagnosis not present

## 2019-05-17 ENCOUNTER — Ambulatory Visit: Payer: Federal, State, Local not specified - PPO | Admitting: Physician Assistant

## 2019-05-31 ENCOUNTER — Ambulatory Visit: Payer: Federal, State, Local not specified - PPO | Admitting: Physician Assistant

## 2019-05-31 ENCOUNTER — Encounter: Payer: Self-pay | Admitting: Physician Assistant

## 2019-05-31 VITALS — BP 106/78 | HR 85 | Temp 97.8°F | Ht 66.75 in | Wt 154.0 lb

## 2019-05-31 DIAGNOSIS — K625 Hemorrhage of anus and rectum: Secondary | ICD-10-CM

## 2019-05-31 DIAGNOSIS — Z800 Family history of malignant neoplasm of digestive organs: Secondary | ICD-10-CM

## 2019-05-31 DIAGNOSIS — R194 Change in bowel habit: Secondary | ICD-10-CM

## 2019-05-31 MED ORDER — NA SULFATE-K SULFATE-MG SULF 17.5-3.13-1.6 GM/177ML PO SOLN: 1.0000 | Freq: Once | ORAL | 0 refills | Status: AC

## 2019-05-31 MED ORDER — METOCLOPRAMIDE HCL 10 MG PO TABS: ORAL_TABLET | ORAL | 0 refills | Status: DC

## 2019-05-31 NOTE — Progress Notes (Signed)
Chief Complaint: Blood in stool  HPI:    [name] is a 49 year old African-American female with past medical history as listed below including family history of colon cancer in her mother at the age of 71, known to Dr. Henrene [name], who was referred to me by Christina Sell, NP for a complaint of blood in stool.      10/21/2017 colonoscopy this was normal with repeat recommended in 5 years.    Today, the patient tells me that on March 17 she started having really hard bowel movements which were hard to pass and also hard themselves, she had another on the 23rd which was the first time that she noticed bright red blood in the toilet, this was on the stool and mixed in with the toilet water.  Since then she has continued with more constipation only having a bowel movement maybe one time per week and it is sometimes uncomfortable to pass and mucousy and often associated with blood.  Before all of this occurred she was having soft solid bowel movements 2 times a week.    Denies fever, chills, weight loss or abdominal pain.     Past Medical History:  Diagnosis Date  . Hypercholesteremia   . Hyperthyroidism   . Migraines    with asthma  . Slipping rib syndrome     Past Surgical History:  Procedure Laterality Date  . BREAST BIOPSY Right 06-26-14   benign per patient  . COLONOSCOPY  08/03/2012   in [place] exam,melanosis  . UPPER GASTROINTESTINAL ENDOSCOPY  09/23/2016    Current Outpatient Medications  Medication Sig Dispense Refill  . Carbamide Peroxide (EAR DROPS OT) Place in ear(s) as needed.     . cholecalciferol (VITAMIN D) 1000 units tablet Take 1,000 Units by mouth daily.    . Iron TABS Take by mouth.    . methimazole (TAPAZOLE) 5 MG tablet Take 0.5 tablets (2.5 mg total) by mouth 3 (three) times a week. 10 tablet 1  . vitamin B-12 (CYANOCOBALAMIN) 1000 MCG tablet Take 1,000 mcg by mouth daily.    . vitamin C (ASCORBIC ACID) 500 MG tablet Take 500 mg by mouth daily.      . Vitamin D, Ergocalciferol, (DRISDOL) 1.25 MG (50000 UT) CAPS capsule Take 1 capsule (50,000 Units total) by mouth every 7 (seven) days. 12 capsule 0   No current facility-administered medications for this visit.    Allergies as of 05/31/2019 - Review Complete 05/15/2019  Allergen Reaction Noted  . Topamax [topiramate]  05/27/2014    Family History  Problem Relation Age of Onset  . Diabetes Mother   . Hypertension Mother   . Colon cancer Mother 56  . Breast cancer Mother 12  . Prostate cancer Father   . Colon cancer Maternal Aunt   . Colon cancer Maternal Aunt   . Stomach cancer Neg Hx   . Esophageal cancer Neg Hx   . Colon polyps Neg Hx   . Rectal cancer Neg Hx     Social History   Socioeconomic History  . Marital status: Married    Spouse name: Not on file  . Number of children: 1  . Years of education: 76  . Highest education level: Not on file  Occupational History  . Occupation: Geologist, engineering  Tobacco Use  . Smoking status: Never Smoker  . Smokeless tobacco: Never Used  Substance and Sexual Activity  . Alcohol use: Not Currently    Alcohol/week: 0.0 standard drinks  .  Drug use: No  . Sexual activity: Yes    Partners: Male    Birth control/protection: Condom  Other Topics Concern  . Not on file  Social History Narrative   Born and raised in [place].    Currently resides in a house with her child. No pets. Fun: Go to the movies.    Denies religious beliefs effecting health care.    Social Determinants of Health   Financial Resource Strain:   . Difficulty of Paying Living Expenses:   Food Insecurity:   . Worried About Charity fundraiser in the Last Year:   . Arboriculturist in the Last Year:   Transportation Needs:   . Film/video editor (Medical):   Marland Kitchen Lack of Transportation (Non-Medical):   Physical Activity:   . Days of Exercise per Week:   . Minutes of Exercise per Session:   Stress:   . Feeling of Stress :   Social Connections:   .  Frequency of Communication with Friends and Family:   . Frequency of Social Gatherings with Friends and Family:   . Attends Religious Services:   . Active Member of Clubs or Organizations:   . Attends Archivist Meetings:   Marland Kitchen Marital Status:   Intimate Partner Violence:   . Fear of Current or Ex-Partner:   . Emotionally Abused:   Marland Kitchen Physically Abused:   . Sexually Abused:     Review of Systems:    Constitutional: No weight loss, fever or chills Cardiovascular: No chest pain  Respiratory: No SOB Gastrointestinal: See HPI and otherwise negative   Physical Exam:  Vital signs: BP 106/78   Pulse 85   Temp 97.8 F (36.6 C)   Ht 5' 6.75" (1.695 m)   Wt 154 lb (69.9 kg)   BMI 24.30 kg/m   Constitutional:   Pleasant AA female appears to be in NAD, Well developed, Well nourished, alert and cooperative Respiratory: Respirations even and unlabored. Lungs clear to auscultation bilaterally.   No wheezes, crackles, or rhonchi.  Cardiovascular: Normal S1, S2. No MRG. Regular rate and rhythm. No peripheral edema, cyanosis or pallor.  Gastrointestinal:  Soft, nondistended, nontender. No rebound or guarding. Normal bowel sounds. No appreciable masses or hepatomegaly. Rectal:  External: no hemorrhoids, fissure or other abnormality; Internal: no mass, no discharge, no ttp; Anoscopy: no abnormality Psychiatric:Demonstrates good judgement and reason without abnormal affect or behaviors.  No recent labs/imaging.  Assessment: 1.  Bright red blood per rectum: Last colonoscopy 10/21/2017 was normal, no obvious source of bleeding at time of rectal exam and anoscopy today, due to this and family history will repeat colonoscopy to rule out neoplasm 2.  Family history of colon cancer in her mother: Diagnosed at the age of 25  Plan: 1.  Scheduled patient for colonoscopy in the [place] with Dr. Henrene [name].  Did discuss risks, benefits, limitations and alternatives and patient agrees to proceed.  She was  told to drink magnesium citrate the morning of prep day and was given Reglan 20 mg to use with each prep, given her difficulty with prep last time and per recommendations from Dr. Henrene [name] after time of procedure. 2.  Patient to follow in clinic per recommendations from Dr. Henrene [name] after time of procedure.  Ellouise Newer, PA-C [facility] 05/31/2019, 11:22 AM  Cc: Christina Sell, NP

## 2019-05-31 NOTE — Progress Notes (Signed)
Assessment and plan reviewed 

## 2019-05-31 NOTE — Patient Instructions (Addendum)
If you are age 50 or older, your body mass index should be between 23-30. Your Body mass index is 24.3 kg/m. If this is out of the aforementioned range listed, please consider follow up with your Primary Care Provider.  If you are age 65 or younger, your body mass index should be between 19-25. Your Body mass index is 24.3 kg/m. If this is out of the aformentioned range listed, please consider follow up with your Primary Care Provider.   We have sent the following medications to your pharmacy for you to pick up at your convenience: Reglan 20 mg take 1 tablet 30 minutes before each half of prep.   You have been scheduled for a colonoscopy. Please follow written instructions given to you at your visit today.  Please pick up your prep supplies at the pharmacy within the next 1-3 days. If you use inhalers (even only as needed), please bring them with you on the day of your procedure.

## 2019-06-05 ENCOUNTER — Encounter: Payer: Self-pay | Admitting: Internal Medicine

## 2019-06-12 ENCOUNTER — Other Ambulatory Visit: Payer: Self-pay

## 2019-06-12 ENCOUNTER — Encounter: Payer: Self-pay | Admitting: Internal Medicine

## 2019-06-12 ENCOUNTER — Ambulatory Visit (AMBULATORY_SURGERY_CENTER): Payer: Federal, State, Local not specified - PPO | Admitting: Internal Medicine

## 2019-06-12 VITALS — BP 90/56 | HR 62 | Temp 97.3°F | Resp 16 | Ht 66.0 in | Wt 154.0 lb

## 2019-06-12 DIAGNOSIS — K625 Hemorrhage of anus and rectum: Secondary | ICD-10-CM

## 2019-06-12 DIAGNOSIS — Z800 Family history of malignant neoplasm of digestive organs: Secondary | ICD-10-CM | POA: Diagnosis not present

## 2019-06-12 DIAGNOSIS — R194 Change in bowel habit: Secondary | ICD-10-CM | POA: Diagnosis not present

## 2019-06-12 MED ORDER — SODIUM CHLORIDE 0.9 % IV SOLN: 500.0000 mL | Freq: Once | INTRAVENOUS | Status: DC

## 2019-06-12 NOTE — Op Note (Signed)
[facility] Patient Name: Christina [name] Procedure Date: 06/12/2019 1:14 PM MRN: [id number] Endoscopist: Docia Chuck. Henrene Pastor , MD Age: 49 Referring MD:  Date of Birth: 1970-11-26 Gender: Female Account #: [id number] Procedure:                Colonoscopy Indications:              Rectal bleeding. Family history of colon cancer in                            mother, age 58. Previous colonoscopy September 2019                            was normal Medicines:                Monitored Anesthesia Care Procedure:                Pre-Anesthesia Assessment:                           - Prior to the procedure, a History and Physical                            was performed, and patient medications and                            allergies were reviewed. The patient's tolerance of                            previous anesthesia was also reviewed. The risks                            and benefits of the procedure and the sedation                            options and risks were discussed with the patient.                            All questions were answered, and informed consent                            was obtained. Prior Anticoagulants: The patient has                            taken no previous anticoagulant or antiplatelet                            agents. ASA Grade Assessment: II - A patient with                            mild systemic disease. After reviewing the risks                            and benefits, the patient was deemed in  satisfactory condition to undergo the procedure.                           After obtaining informed consent, the colonoscope                            was passed under direct vision. Throughout the                            procedure, the patient's blood pressure, pulse, and                            oxygen saturations were monitored continuously. The                            Colonoscope was introduced through the anus and                             advanced to the the cecum, identified by                            appendiceal orifice and ileocecal valve. The                            ileocecal valve, appendiceal orifice, and rectum                            were photographed. The quality of the bowel                            preparation was excellent. The colonoscopy was                            performed without difficulty. The patient tolerated                            the procedure well. The bowel preparation used was                            SUPREP via split dose instruction. Scope In: 1:37:11 PM Scope Out: 1:49:26 PM Scope Withdrawal Time: 0 hours 8 minutes 42 seconds  Total Procedure Duration: 0 hours 12 minutes 15 seconds  Findings:                 The entire examined colon appeared normal on direct                            and retroflexion views. Complications:            No immediate complications. Estimated blood loss:                            None. Estimated Blood Loss:     Estimated blood loss: none. Impression:               - The entire examined  colon is normal on direct and                            retroflexion views.                           - No cause for bleeding found. Suspect transient                            fissure based on history. Recommendation:           - Repeat colonoscopy in 5 years for screening                            purposes.                           - Patient has a contact number available for                            emergencies. The signs and symptoms of potential                            delayed complications were discussed with the                            patient. Return to normal activities tomorrow.                            Written discharge instructions were provided to the                            patient.                           - Resume previous diet.                           - Continue present medications. Docia Chuck.  Henrene Pastor, MD 06/12/2019 1:53:59 PM This report has been signed electronically.

## 2019-06-12 NOTE — Progress Notes (Signed)
pt tolerated well. VSS. awake and to recovery. Report given to RN.  

## 2019-06-12 NOTE — Progress Notes (Signed)
Pt's states no medical or surgical changes since previsit or office visit. 

## 2019-06-12 NOTE — Patient Instructions (Signed)
YOU HAD AN ENDOSCOPIC PROCEDURE TODAY AT THE [facility]:   Refer to the procedure report that was given to you for any specific questions about what was found during the examination.  If the procedure report does not answer your questions, please call your gastroenterologist to clarify.  If you requested that your care partner not be given the details of your procedure findings, then the procedure report has been included in a sealed envelope for you to review at your convenience later.  YOU SHOULD EXPECT: Some feelings of bloating in the abdomen. Passage of more gas than usual.  Walking can help get rid of the air that was put into your GI tract during the procedure and reduce the bloating. If you had a lower endoscopy (such as a colonoscopy or flexible sigmoidoscopy) you may notice spotting of blood in your stool or on the toilet paper. If you underwent a bowel prep for your procedure, you may not have a normal bowel movement for a few days.  Please Note:  You might notice some irritation and congestion in your nose or some drainage.  This is from the oxygen used during your procedure.  There is no need for concern and it should clear up in a day or so.  SYMPTOMS TO REPORT IMMEDIATELY:   Following lower endoscopy (colonoscopy or flexible sigmoidoscopy):  Excessive amounts of blood in the stool  Significant tenderness or worsening of abdominal pains  Swelling of the abdomen that is new, acute  Fever of 100F or higher  For urgent or emergent issues, a gastroenterologist can be reached at any hour by calling [phone]. Do not use MyChart messaging for urgent concerns.    DIET:  We do recommend a small meal at first, but then you may proceed to your regular diet.  Drink plenty of fluids but you should avoid alcoholic beverages for 24 hours.  ACTIVITY:  You should plan to take it easy for the rest of today and you should NOT DRIVE or use heavy machinery until tomorrow (because  of the sedation medicines used during the test).    FOLLOW UP: Our staff will call the number listed on your records 48-72 hours following your procedure to check on you and address any questions or concerns that you may have regarding the information given to you following your procedure. If we do not reach you, we will leave a message.  We will attempt to reach you two times.  During this call, we will ask if you have developed any symptoms of COVID 19. If you develop any symptoms (ie: fever, flu-like symptoms, shortness of breath, cough etc.) before then, please call [phone].  If you test positive for Covid 19 in the 2 weeks post procedure, please call and report this information to us.    If any biopsies were taken you will be contacted by phone or by letter within the next 1-3 weeks.  Please call us at [phone] if you have not heard about the biopsies in 3 weeks.    SIGNATURES/CONFIDENTIALITY: You and/or your care partner have signed paperwork which will be entered into your electronic medical record.  These signatures attest to the fact that that the information above on your After Visit Summary has been reviewed and is understood.  Full responsibility of the confidentiality of this discharge information lies with you and/or your care-partner. 

## 2019-06-13 ENCOUNTER — Telehealth: Payer: Self-pay | Admitting: Internal Medicine

## 2019-06-13 NOTE — Telephone Encounter (Signed)
Pt called requesting letter for work that excuses her from work on Monday and Tuesday. Pt had procedure yesterday and had to take Monday off to be able to do prep. Pls call pt when it is ready to pick it up.

## 2019-06-13 NOTE — Telephone Encounter (Signed)
Letter ready. Pt aware.

## 2019-06-14 ENCOUNTER — Telehealth: Payer: Self-pay | Admitting: *Deleted

## 2019-06-14 DIAGNOSIS — H9312 Tinnitus, left ear: Secondary | ICD-10-CM | POA: Diagnosis not present

## 2019-06-14 DIAGNOSIS — H938X2 Other specified disorders of left ear: Secondary | ICD-10-CM | POA: Diagnosis not present

## 2019-06-14 DIAGNOSIS — L299 Pruritus, unspecified: Secondary | ICD-10-CM | POA: Diagnosis not present

## 2019-06-14 DIAGNOSIS — H6123 Impacted cerumen, bilateral: Secondary | ICD-10-CM | POA: Diagnosis not present

## 2019-06-14 NOTE — Telephone Encounter (Signed)
  Follow up Call-  Call back number 06/12/2019 10/21/2017 09/23/2016  Post procedure Call Back phone  # [phone]  Permission to leave phone message Yes Yes Yes  Some recent data might be hidden     Patient questions:  Message left to call us if necessary.  SEcond call.

## 2019-06-14 NOTE — Telephone Encounter (Signed)
Letter sent via mychart and mailed to pt.

## 2019-06-14 NOTE — Telephone Encounter (Signed)
  Follow up Call-  Call back number 06/12/2019 10/21/2017 09/23/2016  Post procedure Call Back phone  # [phone]  Permission to leave phone message Yes Yes Yes  Some recent data might be hidden     Patient questions:  Message left to call if necessary.

## 2019-06-21 DIAGNOSIS — H6983 Other specified disorders of Eustachian tube, bilateral: Secondary | ICD-10-CM | POA: Diagnosis not present

## 2019-07-05 DIAGNOSIS — H6983 Other specified disorders of Eustachian tube, bilateral: Secondary | ICD-10-CM | POA: Diagnosis not present

## 2019-07-18 ENCOUNTER — Other Ambulatory Visit: Payer: Self-pay | Admitting: Endocrinology

## 2019-07-18 DIAGNOSIS — E0590 Thyrotoxicosis, unspecified without thyrotoxic crisis or storm: Secondary | ICD-10-CM

## 2019-09-04 ENCOUNTER — Ambulatory Visit: Payer: Federal, State, Local not specified - PPO | Admitting: Certified Nurse Midwife

## 2019-09-07 ENCOUNTER — Other Ambulatory Visit: Payer: Self-pay

## 2019-09-07 ENCOUNTER — Other Ambulatory Visit: Payer: Self-pay | Admitting: Nurse Practitioner

## 2019-09-07 ENCOUNTER — Telehealth: Payer: Self-pay | Admitting: Endocrinology

## 2019-09-07 DIAGNOSIS — Z1231 Encounter for screening mammogram for malignant neoplasm of breast: Secondary | ICD-10-CM

## 2019-09-07 DIAGNOSIS — E0590 Thyrotoxicosis, unspecified without thyrotoxic crisis or storm: Secondary | ICD-10-CM

## 2019-09-07 MED ORDER — METHIMAZOLE 5 MG PO TABS: ORAL_TABLET | ORAL | 0 refills | Status: DC

## 2019-09-07 NOTE — Telephone Encounter (Signed)
Outpatient Medication Detail   Disp Refills Start End   methimazole (TAPAZOLE) 5 MG tablet 10 tablet 0 09/07/2019    Sig: TAKE 1/2 (ONE-HALF) TABLET BY MOUTH THREE TIMES A WEEK   Sent to pharmacy as: methimazole (TAPAZOLE) 5 MG tablet   E-Prescribing Status: Receipt confirmed by pharmacy (09/07/2019  1:15 PM EDT)

## 2019-09-07 NOTE — Telephone Encounter (Signed)
Medication Refill Request  Did you call your pharmacy and request this refill first?yes, was advised to call here  . If patient has not contacted pharmacy first, instruct them to do so for future refills.  . Remind them that contacting the pharmacy for their refill is the quickest method to get the refill.  . Refill policy also stated that it will take anywhere between 24-72 hours to receive the refill.     Name of medication? methIMAzole 5 MG TAKE 1/2 (ONE-HALF) TABLET BY MOUTH THREE TIMES A WEEK  Is this a 90 day supply? no  Name and location of pharmacy?  [facility] [address]

## 2019-10-08 ENCOUNTER — Other Ambulatory Visit: Payer: Self-pay

## 2019-10-09 ENCOUNTER — Encounter: Payer: Self-pay | Admitting: Nurse Practitioner

## 2019-10-09 ENCOUNTER — Other Ambulatory Visit: Payer: Self-pay | Admitting: Nurse Practitioner

## 2019-10-09 ENCOUNTER — Encounter: Payer: Self-pay | Admitting: Obstetrics and Gynecology

## 2019-10-09 ENCOUNTER — Ambulatory Visit (INDEPENDENT_AMBULATORY_CARE_PROVIDER_SITE_OTHER): Payer: Federal, State, Local not specified - PPO | Admitting: Nurse Practitioner

## 2019-10-09 ENCOUNTER — Ambulatory Visit: Payer: Federal, State, Local not specified - PPO | Admitting: Obstetrics and Gynecology

## 2019-10-09 VITALS — BP 115/68 | HR 86 | Ht 67.0 in | Wt 157.0 lb

## 2019-10-09 VITALS — BP 110/70 | HR 69 | Temp 97.3°F | Ht 67.0 in | Wt 156.6 lb

## 2019-10-09 DIAGNOSIS — Z803 Family history of malignant neoplasm of breast: Secondary | ICD-10-CM | POA: Diagnosis not present

## 2019-10-09 DIAGNOSIS — E0590 Thyrotoxicosis, unspecified without thyrotoxic crisis or storm: Secondary | ICD-10-CM

## 2019-10-09 DIAGNOSIS — N6332 Unspecified lump in axillary tail of the left breast: Secondary | ICD-10-CM

## 2019-10-09 DIAGNOSIS — Z01419 Encounter for gynecological examination (general) (routine) without abnormal findings: Secondary | ICD-10-CM

## 2019-10-09 DIAGNOSIS — E7800 Pure hypercholesterolemia, unspecified: Secondary | ICD-10-CM

## 2019-10-09 DIAGNOSIS — E559 Vitamin D deficiency, unspecified: Secondary | ICD-10-CM | POA: Diagnosis not present

## 2019-10-09 DIAGNOSIS — Z800 Family history of malignant neoplasm of digestive organs: Secondary | ICD-10-CM | POA: Diagnosis not present

## 2019-10-09 DIAGNOSIS — Z0001 Encounter for general adult medical examination with abnormal findings: Secondary | ICD-10-CM

## 2019-10-09 LAB — LIPID PANEL
Cholesterol: 132 mg/dL (ref 0–200)
HDL: 33.1 mg/dL — ABNORMAL LOW (ref >39.00)
LDL Cholesterol: 91 mg/dL (ref 0–99)
NonHDL: 99.36
Total CHOL/HDL Ratio: 4
Triglycerides: 41 mg/dL (ref 0.0–149.0)
VLDL: 8.2 mg/dL (ref 0.0–40.0)

## 2019-10-09 LAB — COMPREHENSIVE METABOLIC PANEL
ALT: 9 U/L (ref 0–35)
AST: 13 U/L (ref 0–37)
Albumin: 4.2 g/dL (ref 3.5–5.2)
Alkaline Phosphatase: 34 U/L — ABNORMAL LOW (ref 39–117)
BUN: 13 mg/dL (ref 6–23)
CO2: 26 mEq/L (ref 19–32)
Calcium: 8.7 mg/dL (ref 8.4–10.5)
Chloride: 106 mEq/L (ref 96–112)
Creatinine, Ser: 0.97 mg/dL (ref 0.40–1.20)
GFR: 73.48 mL/min (ref >60.00)
Glucose, Bld: 94 mg/dL (ref 70–99)
Potassium: 4.3 mEq/L (ref 3.5–5.1)
Sodium: 138 mEq/L (ref 135–145)
Total Bilirubin: 0.5 mg/dL (ref 0.2–1.2)
Total Protein: 6.7 g/dL (ref 6.0–8.3)

## 2019-10-09 LAB — CBC WITH DIFFERENTIAL/PLATELET
Basophils Absolute: 0 10*3/uL (ref 0.0–0.1)
Basophils Relative: 0.6 % (ref 0.0–3.0)
Eosinophils Absolute: 0 10*3/uL (ref 0.0–0.7)
Eosinophils Relative: 1.2 % (ref 0.0–5.0)
HCT: 39.7 % (ref 36.0–46.0)
Hemoglobin: 13 g/dL (ref 12.0–15.0)
Lymphocytes Relative: 36.9 % (ref 12.0–46.0)
Lymphs Abs: 1.5 10*3/uL (ref 0.7–4.0)
MCHC: 32.6 g/dL (ref 30.0–36.0)
MCV: 87.5 fl (ref 78.0–100.0)
Monocytes Absolute: 0.4 10*3/uL (ref 0.1–1.0)
Monocytes Relative: 10.8 % (ref 3.0–12.0)
Neutro Abs: 2 10*3/uL (ref 1.4–7.7)
Neutrophils Relative %: 50.5 % (ref 43.0–77.0)
Platelets: 212 10*3/uL (ref 150.0–400.0)
RBC: 4.54 Mil/uL (ref 3.87–5.11)
RDW: 12.8 % (ref 11.5–15.5)
WBC: 4 10*3/uL (ref 4.0–10.5)

## 2019-10-09 LAB — T4, FREE: Free T4: 0.83 ng/dL (ref 0.60–1.60)

## 2019-10-09 LAB — TSH: TSH: 5.81 u[IU]/mL — ABNORMAL HIGH (ref 0.35–4.50)

## 2019-10-09 NOTE — Assessment & Plan Note (Signed)
Repeat lipid panel today. 

## 2019-10-09 NOTE — Assessment & Plan Note (Signed)
Repeat TSH and T4 today Will forward results to Dr. Loanne Drilling towards your upcoming appt in October. Current use of methimazole

## 2019-10-09 NOTE — Progress Notes (Signed)
Subjective:    Patient ID: Christina [name]. Christina [name], female    DOB: [birth year], 49 y.o.   MRN: [id number]  Patient presents today for CPE and eval of left breast mass.  HPI  Accompanied by husband  Sexual History (orientation,birth control, marital status, STD):reports pelvic exam will be completed by GYN, requesting for breast exam today due to concern about left breast mass.  Depression/Suicide: Depression screen [facility] 2/9 08/20/2016  Decreased Interest 0  Down, Depressed, Hopeless 0  PHQ - 2 Score 0   Vision:up to date  Dental:up to date  Immunizations: (TDAP, Hep C screen, Pneumovax, Influenza, zoster)  Health Maintenance  Topic Date Due  .  Hepatitis C: One time screening is recommended by Center for Disease Control  (CDC) for  adults born from [birth year] through 1966.   Never done  . HIV Screening  Never done  . Flu Shot  04/24/2020*  . Mammogram  09/13/2020  . Pap Smear  08/28/2021  . Tetanus Vaccine  05/26/2024  . Colon Cancer Screening  06/11/2024  *Topic was postponed. The date shown is not the original due date.   Diet:regular.  Weight:  Wt Readings from Last 3 Encounters:  10/09/19 157 lb (71.2 kg)  10/09/19 156 lb 9.6 oz (71 kg)  06/12/19 154 lb (69.9 kg)   Fall Risk: Fall Risk  02/21/2018 02/16/2017 02/12/2015  Falls in the past year? 0 No No  Follow up Falls evaluation completed - -   Medications and allergies reviewed with patient and updated if appropriate.  Patient Active Problem List   Diagnosis Date Noted  . Tinnitus aurium, left 06/14/2019  . Nonallopathic lesion of lumbosacral region 08/17/2017  . Neck pain 03/18/2017  . Hypercholesterolemia 03/08/2017  . Left lumbar radiculopathy 09/07/2016  . SI (sacroiliac) joint dysfunction 05/27/2016  . Nonallopathic lesion of sacral region 05/27/2016  . Whiplash injuries, initial encounter 04/14/2016  . Low back pain 12/31/2015  . 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 Nonallopathic lesion of thoracic region 07/12/2014   . Nonallopathic lesion-rib cage 07/12/2014  . Nonallopathic lesion of cervical region 07/12/2014  . Family history of breast cancer 07/10/2014  . Rib pain on right side 06/21/2014  . Routine general medical examination at a health care facility 05/27/2014  . Migraine 04/03/2014  . Hyperthyroidism 12/26/2013  . Vitamin D deficiency 04/26/2013   Current Outpatient Medications on File Prior to Visit  Medication Sig Dispense Refill  . Carbamide Peroxide (EAR DROPS OT) Place in ear(s) as needed.     . methimazole (TAPAZOLE) 5 MG tablet TAKE 1/2 (ONE-HALF) TABLET BY MOUTH THREE TIMES A WEEK 10 tablet 0   No current facility-administered medications on file prior to visit.    Past Medical History:  Diagnosis Date  . Hypercholesteremia   . Hyperthyroidism   . Migraines    with asthma  . Slipping rib syndrome     Past Surgical History:  Procedure Laterality Date  . BREAST BIOPSY Right 06-26-14   benign per patient  . COLONOSCOPY  08/03/2012   in [place] exam,melanosis  . UPPER GASTROINTESTINAL ENDOSCOPY  09/23/2016    Social History   Socioeconomic History  . Marital status: Married    Spouse name: Not on file  . Number of children: 1  . Years of education: 9  . Highest education level: Not on file  Occupational History  . Occupation: Geologist, engineering  Tobacco Use  . Smoking status: Never Smoker  . Smokeless tobacco: Never Used  Vaping  Use  . Vaping Use: Never used  Substance and Sexual Activity  . Alcohol use: Not Currently    Alcohol/week: 0.0 standard drinks  . Drug use: No  . Sexual activity: Yes    Partners: Male    Birth control/protection: Condom  Other Topics Concern  . Not on file  Social History Narrative   Born and raised in [place].    Currently resides in a house with her child. No pets. Fun: Go to the movies.    Denies religious beliefs effecting health care.    Social Determinants of Health   Financial Resource Strain:   .  Difficulty of Paying Living Expenses: Not on file  Food Insecurity:   . Worried About Charity fundraiser in the Last Year: Not on file  . Ran Out of Food in the Last Year: Not on file  Transportation Needs:   . Lack of Transportation (Medical): Not on file  . Lack of Transportation (Non-Medical): Not on file  Physical Activity:   . Days of Exercise per Week: Not on file  . Minutes of Exercise per Session: Not on file  Stress:   . Feeling of Stress : Not on file  Social Connections:   . Frequency of Communication with Friends and Family: Not on file  . Frequency of Social Gatherings with Friends and Family: Not on file  . Attends Religious Services: Not on file  . Active Member of Clubs or Organizations: Not on file  . Attends Archivist Meetings: Not on file  . Marital Status: Not on file    Family History  Problem Relation Age of Onset  . Diabetes Mother   . Hypertension Mother   . Colon cancer Mother 107  . Breast cancer Mother 56  . Prostate cancer Father   . Colon cancer Maternal Aunt   . Colon cancer Maternal Aunt   . Stomach cancer Neg Hx   . Esophageal cancer Neg Hx   . Colon polyps Neg Hx   . Rectal cancer Neg Hx         Review of Systems  Constitutional: Negative for fever, malaise/fatigue and weight loss.  HENT: Negative for congestion and sore throat.   Eyes:       Negative for visual changes  Respiratory: Negative for cough and shortness of breath.   Cardiovascular: Negative for chest pain, palpitations and leg swelling.  Gastrointestinal: Negative for blood in stool, constipation, diarrhea and heartburn.  Genitourinary: Negative for dysuria, frequency and urgency.  Musculoskeletal: Negative for falls, joint pain and myalgias.  Skin: Negative for rash.  Neurological: Negative for dizziness, sensory change and headaches.  Endo/Heme/Allergies: Does not bruise/bleed easily.  Psychiatric/Behavioral: Negative for depression, substance abuse and  suicidal ideas. The patient is not nervous/anxious.    Objective:   Vitals:   10/09/19 0818  BP: 110/70  Pulse: 69  Temp: (!) 97.3 F (36.3 C)  SpO2: 98%    Body mass index is 24.53 kg/m.   Physical Examination:  Physical Exam Vitals and nursing note reviewed. Exam conducted with a chaperone present.  Constitutional:      General: She is not in acute distress.    Appearance: She is well-developed.  HENT:     Right Ear: Tympanic membrane, ear canal and external ear normal.     Left Ear: Tympanic membrane, ear canal and external ear normal.  Eyes:     Extraocular Movements: Extraocular movements intact.     Conjunctiva/sclera: Conjunctivae normal.  Cardiovascular:     Rate and Rhythm: Normal rate and regular rhythm.     Pulses: Normal pulses.     Heart sounds: Normal heart sounds.  Pulmonary:     Effort: Pulmonary effort is normal. No respiratory distress.     Breath sounds: Normal breath sounds.  Chest:     Chest wall: No tenderness.     Breasts:        Right: Normal.        Left: Mass and tenderness present. No swelling, bleeding, inverted nipple, nipple discharge or skin change.    Abdominal:     General: Bowel sounds are normal.     Palpations: Abdomen is soft.  Genitourinary:    Comments: Deferred to GYN Musculoskeletal:        General: Normal range of motion.     Cervical back: Normal range of motion and neck supple.     Right lower leg: No edema.     Left lower leg: No edema.  Lymphadenopathy:     Cervical: No cervical adenopathy.     Upper Body:     Right upper body: No supraclavicular, axillary or pectoral adenopathy.     Left upper body: No supraclavicular, axillary or pectoral adenopathy.  Skin:    General: Skin is warm and dry.  Neurological:     Mental Status: She is alert and oriented to person, place, and time.     Deep Tendon Reflexes: Reflexes are normal and symmetric.  Psychiatric:        Mood and Affect: Mood normal.        Behavior:  Behavior normal.        Thought Content: Thought content normal.    ASSESSMENT and PLAN: This visit occurred during the SARS-CoV-2 public health emergency.  Safety protocols were in place, including screening questions prior to the visit, additional usage of staff PPE, and extensive cleaning of exam room while observing appropriate contact time as indicated for disinfecting solutions.   Krina was seen today for establish care.  Diagnoses and all orders for this visit:  Encounter for preventative adult health care exam with abnormal findings -     CBC with Differential/Platelet -     Comprehensive metabolic panel  Vitamin D deficiency -     Vitamin D 1,25 dihydroxy  Hyperthyroidism -     T4, free -     TSH  Hypercholesterolemia -     Lipid panel  Mass of axillary tail of left breast -     Cancel: US BREAST LTD UNI LEFT INC AXILLA; Future -     Cancel: MM Digital Diagnostic Bilat; Future        Problem List Items Addressed This Visit      Endocrine   Hyperthyroidism    Repeat TSH and T4 today Will forward results to Dr. Loanne Drilling towards your upcoming appt in October. Current use of methimazole      Relevant Orders   T4, free   TSH     Other   Hypercholesterolemia    Repeat lipid panel today      Relevant Orders   Lipid panel   Vitamin D deficiency   Relevant Orders   Vitamin D 1,25 dihydroxy    Other Visit Diagnoses    Encounter for preventative adult health care exam with abnormal findings    -  Primary   Relevant Orders   CBC with Differential/Platelet   Comprehensive metabolic panel   Mass of axillary  tail of left breast          Follow up: Return in about 1 year (around 10/08/2020) for CPE (fasting).  Wilfred Lacy, NP

## 2019-10-09 NOTE — Patient Instructions (Addendum)
Go to lab for blood draw  Will forward thyroid function to Dr. Loanne Drilling towards your upcoming appt.  Schedule appt with Dr. [name] to f/up on lower back pain  Schedule appt with breast center for diagnostic mammogram and [place].  Preventive Care 50-50 Years Old, Female Preventive care refers to visits with your health care provider and lifestyle choices that can promote health and wellness. This includes:  A yearly physical exam. This may also be called an annual well check.  Regular dental visits and eye exams.  Immunizations.  Screening for certain conditions.  Healthy lifestyle choices, such as eating a healthy diet, getting regular exercise, not using drugs or products that contain nicotine and tobacco, and limiting alcohol use. What can I expect for my preventive care visit? Physical exam Your health care provider will check your:  Height and weight. This may be used to calculate body mass index (BMI), which tells if you are at a healthy weight.  Heart rate and blood pressure.  Skin for abnormal spots. Counseling Your health care provider may ask you questions about your:  Alcohol, tobacco, and drug use.  Emotional well-being.  Home and relationship well-being.  Sexual activity.  Eating habits.  Work and work Statistician.  Method of birth control.  Menstrual cycle.  Pregnancy history. What immunizations do I need?  Influenza (flu) vaccine  This is recommended every year. Tetanus, diphtheria, and pertussis (Tdap) vaccine  You may need a Td booster every 10 years. Varicella (chickenpox) vaccine  You may need this if you have not been vaccinated. Zoster (shingles) vaccine  You may need this after age 85. Measles, mumps, and rubella (MMR) vaccine  You may need at least one dose of MMR if you were born in 1957 or later. You may also need a second dose. Pneumococcal conjugate (PCV13) vaccine  You may need this if you have certain conditions and were not  previously vaccinated. Pneumococcal polysaccharide (PPSV23) vaccine  You may need one or two doses if you smoke cigarettes or if you have certain conditions. Meningococcal conjugate (MenACWY) vaccine  You may need this if you have certain conditions. Hepatitis A vaccine  You may need this if you have certain conditions or if you travel or work in places where you may be exposed to hepatitis A. Hepatitis B vaccine  You may need this if you have certain conditions or if you travel or work in places where you may be exposed to hepatitis B. Haemophilus influenzae type b (Hib) vaccine  You may need this if you have certain conditions. Human papillomavirus (HPV) vaccine  If recommended by your health care provider, you may need three doses over 6 months. You may receive vaccines as individual doses or as more than one vaccine together in one shot (combination vaccines). Talk with your health care provider about the risks and benefits of combination vaccines. What tests do I need? Blood tests  Lipid and cholesterol levels. These may be checked every 5 years, or more frequently if you are over 50 years old.  Hepatitis C test.  Hepatitis B test. Screening  Lung cancer screening. You may have this screening every year starting at age 50 if you have a 30-pack-year history of smoking and currently smoke or have quit within the past 15 years.  Colorectal cancer screening. All adults should have this screening starting at age 50 and continuing until age 50. Your health care provider may recommend screening at age 50 if you are at increased risk.  You will have tests every 1-10 years, depending on your results and the type of screening test.  Diabetes screening. This is done by checking your blood sugar (glucose) after you have not eaten for a while (fasting). You may have this done every 1-3 years.  Mammogram. This may be done every 1-2 years. Talk with your health care provider about when you  should start having regular mammograms. This may depend on whether you have a family history of breast cancer.  BRCA-related cancer screening. This may be done if you have a family history of breast, ovarian, tubal, or peritoneal cancers.  Pelvic exam and Pap test. This may be done every 3 years starting at age 24. Starting at age 21, this may be done every 5 years if you have a Pap test in combination with an HPV test. Other tests  Sexually transmitted disease (STD) testing.  Bone density scan. This is done to screen for osteoporosis. You may have this scan if you are at high risk for osteoporosis. Follow these instructions at home: Eating and drinking  Eat a diet that includes fresh fruits and vegetables, whole grains, lean protein, and low-fat dairy.  Take vitamin and mineral supplements as recommended by your health care provider.  Do not drink alcohol if: ? Your health care provider tells you not to drink. ? You are pregnant, may be pregnant, or are planning to become pregnant.  If you drink alcohol: ? Limit how much you have to 0-1 drink a day. ? Be aware of how much alcohol is in your drink. In the [place] one drink equals one 12 oz bottle of beer (355 mL), one 5 oz glass of wine (148 mL), or one 1 oz glass of hard liquor (44 mL). Lifestyle  Take daily care of your teeth and gums.  Stay active. Exercise for at least 30 minutes on 5 or more days each week.  Do not use any products that contain nicotine or tobacco, such as cigarettes, e-cigarettes, and chewing tobacco. If you need help quitting, ask your health care provider.  If you are sexually active, practice safe sex. Use a condom or other form of birth control (contraception) in order to prevent pregnancy and STIs (sexually transmitted infections).  If told by your health care provider, take low-dose aspirin daily starting at age 30. What's next?  Visit your health care provider once a year for a well check  visit.  Ask your health care provider how often you should have your eyes and teeth checked.  Stay up to date on all vaccines. This information is not intended to replace advice given to you by your health care provider. Make sure you discuss any questions you have with your health care provider. Document Revised: 09/22/2017 Document Reviewed: 09/22/2017 Elsevier Patient Education  2020 [employer].

## 2019-10-09 NOTE — Patient Instructions (Signed)
EXERCISE AND DIET:  We recommended that you start or continue a regular exercise program for good health. Regular exercise means any activity that makes your heart beat faster and makes you sweat.  We recommend exercising at least 30 minutes per day at least 3 days a week, preferably 4 or 5.  We also recommend a diet low in fat and sugar.  Inactivity, poor dietary choices and obesity can cause diabetes, heart attack, stroke, and kidney damage, among others.    ALCOHOL AND SMOKING:  Women should limit their alcohol intake to no more than 7 drinks/beers/glasses of wine (combined, not each!) per week. Moderation of alcohol intake to this level decreases your risk of breast cancer and liver damage. And of course, no recreational drugs are part of a healthy lifestyle.  And absolutely no smoking or even second hand smoke. Most people know smoking can cause heart and lung diseases, but did you know it also contributes to weakening of your bones? Aging of your skin?  Yellowing of your teeth and nails?  CALCIUM AND VITAMIN D:  Adequate intake of calcium and Vitamin D are recommended.  The recommendations for exact amounts of these supplements seem to change often, but generally speaking 1,000 mg of calcium (between diet and supplement) and 800 units of Vitamin D per day seems prudent. Certain women may benefit from higher intake of Vitamin D.  If you are among these women, your doctor will have told you during your visit.    PAP SMEARS:  Pap smears, to check for cervical cancer or precancers,  have traditionally been done yearly, although recent scientific advances have shown that most women can have pap smears less often.  However, every woman still should have a physical exam from her gynecologist every year. It will include a breast check, inspection of the vulva and vagina to check for abnormal growths or skin changes, a visual exam of the cervix, and then an exam to evaluate the size and shape of the uterus and  ovaries.  And after 50 years of age, a rectal exam is indicated to check for rectal cancers. We will also provide age appropriate advice regarding health maintenance, like when you should have certain vaccines, screening for sexually transmitted diseases, bone density testing, colonoscopy, mammograms, etc.   MAMMOGRAMS:  All women over 40 years old should have a yearly mammogram. Many facilities now offer a "3D" mammogram, which may cost around $50 extra out of pocket. If possible,  we recommend you accept the option to have the 3D mammogram performed.  It both reduces the number of women who will be called back for extra views which then turn out to be normal, and it is better than the routine mammogram at detecting truly abnormal areas.    COLON CANCER SCREENING: Now recommend starting at age 45. At this time colonoscopy is not covered for routine screening until 50. There are take home tests that can be done between 45-49.   COLONOSCOPY:  Colonoscopy to screen for colon cancer is recommended for all women at age 50.  We know, you hate the idea of the prep.  We agree, BUT, having colon cancer and not knowing it is worse!!  Colon cancer so often starts as a polyp that can be seen and removed at colonscopy, which can quite literally save your life!  And if your first colonoscopy is normal and you have no family history of colon cancer, most women don't have to have it again for   10 years.  Once every ten years, you can do something that may end up saving your life, right?  We will be happy to help you get it scheduled when you are ready.  Be sure to check your insurance coverage so you understand how much it will cost.  It may be covered as a preventative service at no cost, but you should check your particular policy.      Breast Self-Awareness Breast self-awareness means being familiar with how your breasts look and feel. It involves checking your breasts regularly and reporting any changes to your  health care provider. Practicing breast self-awareness is important. A change in your breasts can be a sign of a serious medical problem. Being familiar with how your breasts look and feel allows you to find any problems early, when treatment is more likely to be successful. All women should practice breast self-awareness, including women who have had breast implants. How to do a breast self-exam One way to learn what is normal for your breasts and whether your breasts are changing is to do a breast self-exam. To do a breast self-exam: Look for Changes  1. Remove all the clothing above your waist. 2. Stand in front of a mirror in a room with good lighting. 3. Put your hands on your hips. 4. Push your hands firmly downward. 5. Compare your breasts in the mirror. Look for differences between them (asymmetry), such as: ? Differences in shape. ? Differences in size. ? Puckers, dips, and bumps in one breast and not the other. 6. Look at each breast for changes in your skin, such as: ? Redness. ? Scaly areas. 7. Look for changes in your nipples, such as: ? Discharge. ? Bleeding. ? Dimpling. ? Redness. ? A change in position. Feel for Changes Carefully feel your breasts for lumps and changes. It is best to do this while lying on your back on the floor and again while sitting or standing in the shower or tub with soapy water on your skin. Feel each breast in the following way:  Place the arm on the side of the breast you are examining above your head.  Feel your breast with the other hand.  Start in the nipple area and make  inch (2 cm) overlapping circles to feel your breast. Use the pads of your three middle fingers to do this. Apply light pressure, then medium pressure, then firm pressure. The light pressure will allow you to feel the tissue closest to the skin. The medium pressure will allow you to feel the tissue that is a little deeper. The firm pressure will allow you to feel the tissue  close to the ribs.  Continue the overlapping circles, moving downward over the breast until you feel your ribs below your breast.  Move one finger-width toward the center of the body. Continue to use the  inch (2 cm) overlapping circles to feel your breast as you move slowly up toward your collarbone.  Continue the up and down exam using all three pressures until you reach your armpit.  Write Down What You Find  Write down what is normal for each breast and any changes that you find. Keep a written record with breast changes or normal findings for each breast. By writing this information down, you do not need to depend only on memory for size, tenderness, or location. Write down where you are in your menstrual cycle, if you are still menstruating. If you are having trouble noticing differences   in your breasts, do not get discouraged. With time you will become more familiar with the variations in your breasts and more comfortable with the exam. How often should I examine my breasts? Examine your breasts every month. If you are breastfeeding, the best time to examine your breasts is after a feeding or after using a breast pump. If you menstruate, the best time to examine your breasts is 5-7 days after your period is over. During your period, your breasts are lumpier, and it may be more difficult to notice changes. When should I see my health care provider? See your health care provider if you notice:  A change in shape or size of your breasts or nipples.  A change in the skin of your breast or nipples, such as a reddened or scaly area.  Unusual discharge from your nipples.  A lump or thick area that was not there before.  Pain in your breasts.  Anything that concerns you.  

## 2019-10-09 NOTE — Progress Notes (Signed)
49 y.o. [id number] Married Black or [place] American Not Hispanic or Latino female here for annual exam.   Menses q month x 3 days. Can saturate a pad in one day. Mild cramps. No BTB. Mood changes prior to her cycles for the last few years. Sexually active, no dyspareunia.     Patient's last menstrual period was 10/07/2019.          Sexually active: Yes.    The current method of family planning is none.    Exercising: No.  The patient has a physically strenuous job, but has no regular exercise apart from work.  Smoker:  no  Health Maintenance: Pap:  08/29/18 negative, negative HPV testing; 07/29/16 WNL 07/10/14 Wnl Hr HPV Neg  History of abnormal Pap:  no MMG:  09/14/18 density C Bi-rads 2 benign  BMD:   None  Colonoscopy: 06/12/19 normal, 5 year f/u TDaP:  2016  Gardasil: NA   reports that she has never smoked. She has never used smokeless tobacco. She reports previous alcohol use. She reports that she does not use drugs. Son is 14, lives locally. 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   Past Medical History:  Diagnosis Date  . Hypercholesteremia   . Hyperthyroidism   . Migraines    with asthma  . Slipping rib syndrome     Past Surgical History:  Procedure Laterality Date  . BREAST BIOPSY Right 06-26-14   benign per patient  . COLONOSCOPY  08/03/2012   in [place] exam,melanosis  . UPPER GASTROINTESTINAL ENDOSCOPY  09/23/2016    Current Outpatient Medications  Medication Sig Dispense Refill  . methimazole (TAPAZOLE) 5 MG tablet TAKE 1/2 (ONE-HALF) TABLET BY MOUTH THREE TIMES A WEEK 10 tablet 0  . Carbamide Peroxide (EAR DROPS OT) Place in ear(s) as needed.      No current facility-administered medications for this visit.    Family History  Problem Relation Age of Onset  . Diabetes Mother   . Hypertension Mother   . Colon cancer Mother 65  . Breast cancer Mother 47  . Prostate cancer Father   . Colon cancer Maternal Aunt   . Colon cancer  Maternal Aunt   . Stomach cancer Neg Hx   . Esophageal cancer Neg Hx   . Colon polyps Neg Hx   . Rectal cancer Neg Hx   Mom was one of 7 kids, 3 had colon cancer. Saw genetics years ago, declined genetic testing.  Maternal first cousin with breast cancer, ? age  Review of Systems  All other systems reviewed and are negative.   Exam:   BP 115/68   Pulse 86   Ht 5\' 7"  (1.702 m)   Wt 157 lb (71.2 kg)   LMP 10/07/2019   SpO2 98%   BMI 24.59 kg/m   Weight change: @WEIGHTCHANGE @ Height:   Height: 5\' 7"  (170.2 cm)  Ht Readings from Last 3 Encounters:  10/09/19 5\' 7"  (1.702 m)  10/09/19 5\' 7"  (1.702 m)  06/12/19 5\' 6"  (1.676 m)    General appearance: alert, cooperative and appears stated age Head: Normocephalic, without obvious abnormality, atraumatic Neck: no adenopathy, supple, symmetrical, trachea midline and thyroid normal to inspection and palpation Lungs: clear to auscultation bilaterally Cardiovascular: regular rate and rhythm Breasts: in the left breast at 2 o'clock, just outside the areolar region is a 3 cm mobile, cystic feeling lump (per patient, known cyst) Abdomen: soft, non-tender; non distended,  no masses,  no  organomegaly Extremities: extremities normal, atraumatic, no cyanosis or edema Skin: Skin color, texture, turgor normal. No rashes or lesions Lymph nodes: Cervical, supraclavicular, and axillary nodes normal. No abnormal inguinal nodes palpated Neurologic: Grossly normal   Pelvic: External genitalia:  no lesions              Urethra:  normal appearing urethra with no masses, tenderness or lesions              Bartholins and Skenes: normal                 Vagina: normal appearing vagina with normal color and discharge, no lesions              Cervix: no lesions               Bimanual Exam:  Uterus:  normal size, contour, position, consistency, mobility, non-tender and anteverted              Adnexa: no mass, fullness, tenderness                Rectovaginal: Confirms               Anus:  normal sphincter tone, no lesions  Gae Dry chaperoned for the exam.  A:  Well Woman with normal exam  FH of breast cancer and colon cancer  Left breast cyst  P:   We discussed the option of going to genetic counselor to help determine her risk of breast cancer. Has declined genetic testing for colon cancer previously  Discussed breast self exam  Discussed calcium and vit D intake  Mammogram has been ordered  Colonoscopy is UTD  Labs with primary

## 2019-10-12 ENCOUNTER — Telehealth: Payer: Self-pay

## 2019-10-12 NOTE — Telephone Encounter (Signed)
-----   Message from Renato Shin, MD sent at 10/11/2019  5:25 PM EDT ----- Please move up f/u to next available.

## 2019-10-12 NOTE — Telephone Encounter (Signed)
Routing this message to the front desk for scheduling purposes. 

## 2019-10-13 LAB — VITAMIN D 1,25 DIHYDROXY
Vitamin D 1, 25 (OH)2 Total: 45 pg/mL (ref 18–72)
Vitamin D2 1, 25 (OH)2: 9 pg/mL
Vitamin D3 1, 25 (OH)2: 36 pg/mL

## 2019-10-17 ENCOUNTER — Other Ambulatory Visit: Payer: Self-pay

## 2019-10-17 ENCOUNTER — Ambulatory Visit: Payer: Federal, State, Local not specified - PPO | Admitting: Family Medicine

## 2019-10-17 ENCOUNTER — Encounter: Payer: Self-pay | Admitting: Family Medicine

## 2019-10-17 VITALS — BP 116/84 | HR 82 | Ht 67.0 in | Wt 158.0 lb

## 2019-10-17 DIAGNOSIS — M5416 Radiculopathy, lumbar region: Secondary | ICD-10-CM

## 2019-10-17 DIAGNOSIS — M999 Biomechanical lesion, unspecified: Secondary | ICD-10-CM | POA: Diagnosis not present

## 2019-10-17 MED ORDER — MELOXICAM 7.5 MG PO TABS: 7.5000 mg | ORAL_TABLET | Freq: Every day | ORAL | 0 refills | Status: DC

## 2019-10-17 MED ORDER — TIZANIDINE HCL 4 MG PO TABS: 4.0000 mg | ORAL_TABLET | Freq: Every day | ORAL | 0 refills | Status: DC

## 2019-10-17 NOTE — Patient Instructions (Addendum)
Meloxicam 7.5 mg Do not use NSAIDS such as Advil or Aleve when taking Meloxicam It is ok to use Tylenol for additional pain relief  Exercises 3x a week  Zanaflex at night for next 3 nights  See me again in 5 weeks

## 2019-10-17 NOTE — Progress Notes (Signed)
[place] Phone: [phone] Subjective:   Christina [name], am serving as a scribe for Dr. Hulan Saas. This visit occurred during the SARS-CoV-2 public health emergency.  Safety protocols were in place, including screening questions prior to the visit, additional usage of staff PPE, and extensive cleaning of exam room while observing appropriate contact time as indicated for disinfecting solutions.   I'm seeing this patient by the request  of:  [name], Charlene Brooke, NP  CC: Neck and back pain follow-up  [id number]  Christina [name] is a 49 y.o. female coming in with complaint of back and neck pain. OMT 01/02/2018. Patient states that she has been having right sided lower back pain. Also notes burning in left leg and foot which is occurring more frequently.   Patient is also having right trap pain.  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  Feels like it is the same thing just worsening symptoms.  Rates the severity of pain is 5 out of 10.  Medications patient has been prescribed: Nothing at present moment          Reviewed prior external information including notes and imaging from previsou exam, outside providers and external EMR if available.   As well as notes that were available from care everywhere and other healthcare systems.  Past medical history, social, surgical and family history all reviewed in electronic medical record.  [name] pertanent information unless stated regarding to the chief complaint.   Past Medical History:  Diagnosis Date  . Hypercholesteremia   . Hyperthyroidism   . Migraines    with asthma  . Slipping rib syndrome     Allergies  Allergen Reactions  . Topamax [Topiramate]     Pain in legs, SOB, and almost passed out     Review of Systems:  [name] headache, visual changes, nausea, vomiting, diarrhea, constipation, dizziness, abdominal pain,  skin rash, fevers, chills, night sweats, weight loss, swollen lymph nodes, body aches, joint swelling, chest pain, shortness of breath, mood changes. POSITIVE muscle aches  Objective  Blood pressure 116/84, pulse 82, height 5\' 7"  (1.702 m), weight 158 lb (71.7 kg), last menstrual period 10/07/2019, SpO2 97 %.   General: [name] apparent distress alert and oriented x3 mood and affect normal, dressed appropriately.  HEENT: Pupils equal, extraocular movements intact  Respiratory: Patient's speak in full sentences and does not appear short of breath  Cardiovascular: [name] lower extremity edema, non tender, [name] erythema  Neuro: Cranial nerves II through XII are intact, neurovascularly intact in all extremities with 2+ DTRs and 2+ pulses.  Gait normal with good balance and coordination.  MSK:  Non tender with full range of motion and good stability and symmetric strength and tone of shoulders, elbows, wrist, hip, knee and ankles bilaterally.  Back -low back exam shows tightness with the left straight leg.  [name] significant radicular symptoms.  [name] weakness noted.  Tightness with Corky Sox as well.  Tenderness to palpation mostly over the left sacroiliac joint  Osteopathic findings  C6 flexed rotated and side bent left T3 extended rotated and side bent right inhaled rib T7 extended rotated and side bent left L2 flexed rotated and side bent right Sacrum left on left       Assessment and Plan:  Left lumbar radiculopathy Chronic problem with exacerbation.  Attempted osteopathic manipulation today.  Discussed which activities to do which wants to avoid.  Patient  given prescription for the meloxicam and Zanaflex.  Follow-up with me again in 5 to 6 weeks    Nonallopathic problems  Decision today to treat with OMT was based on Physical Exam  After verbal consent patient was treated with HVLA, ME, FPR techniques in cervical, rib, thoracic, lumbar, and sacral  areas  Patient tolerated the procedure well with  improvement in symptoms  Patient given exercises, stretches and lifestyle modifications  See medications in patient instructions if given  Patient will follow up in 4-8 weeks      The above documentation has been reviewed and is accurate and complete Lyndal Pulley, DO       Note: This dictation was prepared with Dragon dictation along with smaller phrase technology. Any transcriptional errors that result from this process are unintentional.

## 2019-10-17 NOTE — Assessment & Plan Note (Signed)
Chronic problem with exacerbation.  Attempted osteopathic manipulation today.  Discussed which activities to do which wants to avoid.  Patient given prescription for the meloxicam and Zanaflex.  Follow-up with me again in 5 to 6 weeks

## 2019-10-22 ENCOUNTER — Other Ambulatory Visit: Payer: Self-pay

## 2019-10-22 ENCOUNTER — Ambulatory Visit
Admission: RE | Admit: 2019-10-22 | Discharge: 2019-10-22 | Disposition: A | Discharge status: Home / Self Care | Payer: Federal, State, Local not specified - PPO | Source: Ambulatory Visit | Attending: Nurse Practitioner | Admitting: Nurse Practitioner

## 2019-10-22 DIAGNOSIS — N6332 Unspecified lump in axillary tail of the left breast: Secondary | ICD-10-CM

## 2019-10-22 DIAGNOSIS — R922 Inconclusive mammogram: Secondary | ICD-10-CM | POA: Diagnosis not present

## 2019-10-22 DIAGNOSIS — N6002 Solitary cyst of left breast: Secondary | ICD-10-CM | POA: Diagnosis not present

## 2019-10-29 ENCOUNTER — Telehealth: Payer: Self-pay | Admitting: Nurse Practitioner

## 2019-10-29 ENCOUNTER — Telehealth: Payer: Self-pay | Admitting: Endocrinology

## 2019-10-29 NOTE — Telephone Encounter (Signed)
Patient is calling and stated that she had a mammogram and the radiologist recommended her to get a MRI in 6 months, please advise. CB [phone]

## 2019-10-29 NOTE — Telephone Encounter (Signed)
Medication Refill Request   Did you call your pharmacy and request this refill first?yes    If patient has not contacted pharmacy first, instruct them to do so for future refills.   Remind them that contacting the pharmacy for their refill is the quickest method to get the refill.   Refill policy also stated that it will take anywhere between 24-72 hours to receive the refill.    Name of medication? Methimazole  Is this a 90 day supply? no  Name and location of pharmacy? Insurance claims handler on [address]

## 2019-10-30 ENCOUNTER — Other Ambulatory Visit: Payer: Self-pay | Admitting: *Deleted

## 2019-10-30 DIAGNOSIS — E0590 Thyrotoxicosis, unspecified without thyrotoxic crisis or storm: Secondary | ICD-10-CM

## 2019-10-30 MED ORDER — METHIMAZOLE 5 MG PO TABS: ORAL_TABLET | ORAL | 0 refills | Status: DC

## 2019-10-30 NOTE — Telephone Encounter (Signed)
LVM for patient to return call. 

## 2019-10-30 NOTE — Telephone Encounter (Signed)
Medication refilled and sent to [facility]

## 2019-11-02 ENCOUNTER — Ambulatory Visit: Payer: Federal, State, Local not specified - PPO | Admitting: Family Medicine

## 2019-11-14 ENCOUNTER — Ambulatory Visit: Payer: Federal, State, Local not specified - PPO | Admitting: Endocrinology

## 2019-11-14 ENCOUNTER — Encounter: Payer: Self-pay | Admitting: Endocrinology

## 2019-11-14 ENCOUNTER — Other Ambulatory Visit: Payer: Self-pay

## 2019-11-14 VITALS — BP 116/80 | HR 85 | Ht 67.0 in | Wt 160.0 lb

## 2019-11-14 DIAGNOSIS — E0590 Thyrotoxicosis, unspecified without thyrotoxic crisis or storm: Secondary | ICD-10-CM

## 2019-11-14 NOTE — Progress Notes (Signed)
Subjective:    Patient ID: Christina [name]. Christina [name], female    DOB: November 07, 1970, 49 y.o.   MRN: [id number]  HPI Pt returns for f/u of hyperthyroidism (dx'ed in early 2015, in [place]; nuc med scan showed diffuse uptake (45% at 24 hrs); [place] in 2017 showed diffuse goiter, with multiple very small nodules; pt is unaware why tapazole was chosen as initial rx, but she wishes to continue). she takes tapazole as rx'ed.  pt states she feels well in general.   Past Medical History:  Diagnosis Date  . Hypercholesteremia   . Hyperthyroidism   . Migraines    with asthma  . Slipping rib syndrome     Past Surgical History:  Procedure Laterality Date  . BREAST BIOPSY Right 06-26-14   benign per patient  . COLONOSCOPY  08/03/2012   in [place] exam,melanosis  . UPPER GASTROINTESTINAL ENDOSCOPY  09/23/2016    Social History   Socioeconomic History  . Marital status: Married    Spouse name: Not on file  . Number of children: 1  . Years of education: 71  . Highest education level: Not on file  Occupational History  . Occupation: Geologist, engineering  Tobacco Use  . Smoking status: Never Smoker  . Smokeless tobacco: Never Used  Vaping Use  . Vaping Use: Never used  Substance and Sexual Activity  . Alcohol use: Not Currently    Alcohol/week: 0.0 standard drinks  . Drug use: No  . Sexual activity: Yes    Partners: Male    Birth control/protection: Condom  Other Topics Concern  . Not on file  Social History Narrative   Born and raised in [place].    Currently resides in a house with her child. No pets. Fun: Go to the movies.    Denies religious beliefs effecting health care.    Social Determinants of Health   Financial Resource Strain:   . Difficulty of Paying Living Expenses: Not on file  Food Insecurity:   . Worried About Charity fundraiser in the Last Year: Not on file  . Ran Out of Food in the Last Year: Not on file  Transportation Needs:   . Lack of Transportation (Medical):  Not on file  . Lack of Transportation (Non-Medical): Not on file  Physical Activity:   . Days of Exercise per Week: Not on file  . Minutes of Exercise per Session: Not on file  Stress:   . Feeling of Stress : Not on file  Social Connections:   . Frequency of Communication with Friends and Family: Not on file  . Frequency of Social Gatherings with Friends and Family: Not on file  . Attends Religious Services: Not on file  . Active Member of Clubs or Organizations: Not on file  . Attends Archivist Meetings: Not on file  . Marital Status: Not on file  Intimate Partner Violence:   . Fear of Current or Ex-Partner: Not on file  . Emotionally Abused: Not on file  . Physically Abused: Not on file  . Sexually Abused: Not on file    Current Outpatient Medications on File Prior to Visit  Medication Sig Dispense Refill  . Carbamide Peroxide (EAR DROPS OT) Place in ear(s) as needed.     . meloxicam (MOBIC) 7.5 MG tablet Take 1 tablet (7.5 mg total) by mouth daily. 30 tablet 0  . tiZANidine (ZANAFLEX) 4 MG tablet Take 1 tablet (4 mg total) by mouth at bedtime. 30 tablet 0  No current facility-administered medications on file prior to visit.    Allergies  Allergen Reactions  . Topamax [Topiramate]     Pain in legs, SOB, and almost passed out    Family History  Problem Relation Age of Onset  . Diabetes Mother   . Hypertension Mother   . Colon cancer Mother 71  . Breast cancer Mother 10  . Prostate cancer Father   . Colon cancer Maternal Aunt   . Colon cancer Maternal Aunt   . Stomach cancer Neg Hx   . Esophageal cancer Neg Hx   . Colon polyps Neg Hx   . Rectal cancer Neg Hx     BP 116/80   Pulse 85   Ht 5\' 7"  (1.702 m)   Wt 160 lb (72.6 kg)   SpO2 98%   BMI 25.06 kg/m    Review of Systems Denies fever.      Objective:   Physical Exam VITAL SIGNS:  See vs page GENERAL: no distress NECK: There is no palpable thyroid enlargement.  No thyroid nodule is  palpable.  No palpable lymphadenopathy at the anterior neck.  Lab Results  Component Value Date   TSH 5.81 (H) 10/09/2019       Assessment & Plan:  Hypothyroidism, due to methimazole.   Patient Instructions  Please stop taking the methimazole. Please come back for a follow-up appointment in 3 months.

## 2019-11-14 NOTE — Patient Instructions (Signed)
Please stop taking the methimazole. Please come back for a follow-up appointment in 3 months.

## 2019-11-20 NOTE — Progress Notes (Signed)
[facility] [address] [place] Phone: [phone] Subjective:   I Christina [name] am serving as a Education administrator for Dr. Hulan Saas.  This visit occurred during the SARS-CoV-2 public health emergency.  Safety protocols were in place, including screening questions prior to the visit, additional usage of staff PPE, and extensive cleaning of exam room while observing appropriate contact time as indicated for disinfecting solutions.   I'm seeing this patient by the request  of:  [name], Charlene Brooke, NP  CC: Low back pain follow-up  [id number]  Christina [name] Christina [name] is a 49 y.o. female coming in with complaint of back and neck pain. OMT 11/16/2019. Patient states she is doing well. Still having some spasms in her upper back on the right side.   Medications patient has been prescribed: Meloxicam, Zanaflex        Reviewed prior external information including notes and imaging from previsou exam, outside providers and external EMR if available.   As well as notes that were available from care everywhere and other healthcare systems.  Past medical history, social, surgical and family history all reviewed in electronic medical record.  No pertanent information unless stated regarding to the chief complaint.   Past Medical History:  Diagnosis Date  . Hypercholesteremia   . Hyperthyroidism   . Migraines    with asthma  . Slipping rib syndrome     Allergies  Allergen Reactions  . Topamax [Topiramate]     Pain in legs, SOB, and almost passed out     Review of Systems:  No headache, visual changes, nausea, vomiting, diarrhea, constipation, dizziness, abdominal pain, skin rash, fevers, chills, night sweats, weight loss, swollen lymph nodes, body aches, joint swelling, chest pain, shortness of breath, mood changes. POSITIVE muscle aches  Objective  Blood pressure 100/80, pulse 93, height 5\' 7"  (1.702 m), weight 158 lb (71.7 kg), SpO2 94 %.     General: No apparent distress alert and oriented x3 mood and affect normal, dressed appropriately.  HEENT: Pupils equal, extraocular movements intact  Respiratory: Patient's speak in full sentences and does not appear short of breath  Cardiovascular: No lower extremity edema, non tender, no erythema  Neuro: Cranial nerves II through XII are intact, neurovascularly intact in all extremities with 2+ DTRs and 2+ pulses.  Gait normal with good balance and coordination.  MSK:  Non tender with full range of motion and good stability and symmetric strength and tone of shoulders, elbows, wrist, hip, knee and ankles bilaterally.  Back -low back exam shows the patient still has a very mild positive straight leg test of the L4-L5 area on the left side.  Patient does have some mild tightness of the left FABER test compared to the contralateral side.  Neck exam is significant improvement in range of motion but still lacks last 5 degrees of extension.  Negative Spurling's.  5-5 strength of the upper extremities  Osteopathic findings   C6 flexed rotated and side bent left T3 extended rotated and side bent right inhaled rib T7 extended rotated and side bent left L2 flexed rotated and side bent right Sacrum right on right       Assessment and Plan:  Left lumbar radiculopathy Patient still having radicular symptoms intermittently.  Not as severe as what it was previously.  Patient wants to hold on anything such as the gabapentin but will continue the muscle relaxer at night as needed.  Encourage patient to use the meloxicam as  well if needed for any type of pain relief during the day.  Patient will continue with the home exercises, patient has had improvement already and responds well to manipulation.  Follow-up again 7 to 8 weeks.    Nonallopathic problems  Decision today to treat with OMT was based on Physical Exam  After verbal consent patient was treated with HVLA, ME, FPR techniques in  cervical, rib, thoracic, lumbar, and sacral  areas  Patient tolerated the procedure well with improvement in symptoms  Patient given exercises, stretches and lifestyle modifications  See medications in patient instructions if given  Patient will follow up in 4-8 weeks      The above documentation has been reviewed and is accurate and complete Christina Pulley, DO       Note: This dictation was prepared with Dragon dictation along with smaller phrase technology. Any transcriptional errors that result from this process are unintentional.

## 2019-11-21 ENCOUNTER — Other Ambulatory Visit: Payer: Self-pay

## 2019-11-21 ENCOUNTER — Encounter: Payer: Self-pay | Admitting: Family Medicine

## 2019-11-21 ENCOUNTER — Ambulatory Visit: Payer: Federal, State, Local not specified - PPO | Admitting: Family Medicine

## 2019-11-21 VITALS — BP 100/80 | HR 93 | Ht 67.0 in | Wt 158.0 lb

## 2019-11-21 DIAGNOSIS — M5416 Radiculopathy, lumbar region: Secondary | ICD-10-CM | POA: Diagnosis not present

## 2019-11-21 DIAGNOSIS — M999 Biomechanical lesion, unspecified: Secondary | ICD-10-CM | POA: Diagnosis not present

## 2019-11-21 NOTE — Patient Instructions (Signed)
Good to see you Much better over all Take meloxicam in 5 day burst See me again in 7-8 weeks

## 2019-11-21 NOTE — Assessment & Plan Note (Signed)
Patient still having radicular symptoms intermittently.  Not as severe as what it was previously.  Patient wants to hold on anything such as the gabapentin but will continue the muscle relaxer at night as needed.  Encourage patient to use the meloxicam as well if needed for any type of pain relief during the day.  Patient will continue with the home exercises, patient has had improvement already and responds well to manipulation.  Follow-up again 7 to 8 weeks.

## 2020-01-07 ENCOUNTER — Encounter: Payer: Self-pay | Admitting: Family Medicine

## 2020-01-07 ENCOUNTER — Other Ambulatory Visit: Payer: Self-pay

## 2020-01-07 ENCOUNTER — Ambulatory Visit: Payer: Federal, State, Local not specified - PPO | Admitting: Family Medicine

## 2020-01-07 ENCOUNTER — Ambulatory Visit (INDEPENDENT_AMBULATORY_CARE_PROVIDER_SITE_OTHER): Payer: Federal, State, Local not specified - PPO

## 2020-01-07 VITALS — BP 118/72 | HR 92 | Ht 67.0 in | Wt 159.0 lb

## 2020-01-07 DIAGNOSIS — M25561 Pain in right knee: Secondary | ICD-10-CM

## 2020-01-07 DIAGNOSIS — M999 Biomechanical lesion, unspecified: Secondary | ICD-10-CM | POA: Diagnosis not present

## 2020-01-07 DIAGNOSIS — M25552 Pain in left hip: Secondary | ICD-10-CM

## 2020-01-07 DIAGNOSIS — M5416 Radiculopathy, lumbar region: Secondary | ICD-10-CM | POA: Diagnosis not present

## 2020-01-07 DIAGNOSIS — M222X1 Patellofemoral disorders, right knee: Secondary | ICD-10-CM

## 2020-01-07 NOTE — Progress Notes (Signed)
Christina [name] Phone: [phone] Subjective:   [name], am serving as a scribe for Dr. Hulan Saas. This visit occurred during the SARS-CoV-2 public health emergency.  Safety protocols were in place, including screening questions prior to the visit, additional usage of staff PPE, and extensive cleaning of exam room while observing appropriate contact time as indicated for disinfecting solutions.   I'm seeing this patient by the request  of:  [name], Charlene Brooke, NP  CC: Back pain follow-up  [id number]  Christina [name] is a 49 y.o. female coming in with complaint of back and neck pain. OMT 11/21/2019. Patient states that her lower back pain is intermittent. Has pain that radiates down left leg that is occasionally a burning sensation. Has taken gabapentin before. Pain in hip is intermittent as well. Laying or sitting for prolonged period will make it worse.   Patient is having right knee pain with stairs. Pain over medial aspect. No injury to her knee.  Medications patient has been prescribed: Meloxicam, Zanaflex  Taking:  MRI 2018 Lumbar spine  IMPRESSION: Negative lumbar MRI       Reviewed prior external information including notes and imaging from previsou exam, outside providers and external EMR if available.   As well as notes that were available from care everywhere and other healthcare systems.  Past medical history, social, surgical and family history all reviewed in electronic medical record.  No pertanent information unless stated regarding to the chief complaint.   Past Medical History:  Diagnosis Date  . Hypercholesteremia   . Hyperthyroidism   . Migraines    with asthma  . Slipping rib syndrome     Allergies  Allergen Reactions  . Topamax [Topiramate]     Pain in legs, SOB, and almost passed out     Review of Systems:  No headache, visual changes, nausea, vomiting, diarrhea,  constipation, dizziness, abdominal pain, skin rash, fevers, chills, night sweats, weight loss, swollen lymph nodes, body aches, joint swelling, chest pain, shortness of breath, mood changes. POSITIVE muscle aches  Objective  Blood pressure 118/72, pulse 92, height 5\' 7"  (1.702 m), weight 159 lb (72.1 kg), last menstrual period 12/23/2019, SpO2 98 %.   General: No apparent distress alert and oriented x3 mood and affect normal, dressed appropriately.  HEENT: Pupils equal, extraocular movements intact  Respiratory: Patient's speak in full sentences and does not appear short of breath  Cardiovascular: No lower extremity edema, non tender, no erythema   Back -low back exam does show some mild tightness noted in the sacroiliac joint.  Neck does have some tightness with some loss of sidebending bilaterally.  Negative Spurling's.  Tightness noted in the right parascapular region.  5 out of 5 strength of all extremities  Right knee exam shows some mild lateral tracking noted.  Lacks last 5 degrees of flexion.  Patient has no instability with valgus and varus force but does have a positive patellar grind test.   Osteopathic findings  C6 flexed rotated and side bent left T3 extended rotated and side bent right inhaled rib T7 extended rotated and side bent left L2 flexed rotated and side bent right Sacrum right on right       Assessment and Plan:  Left lumbar radiculopathy Continues to have signs and symptoms consistent with a left lumbar radiculopathy.  We will get x-rays of the left hip to further evaluate for any arthritic changes that could be  contributing.  We discussed posture and ergonomics.  Discussed different medications which patient does not like to take but did discuss Zanaflex.  Does respond fairly well to osteopathic manipulation.  Follow-up with me again in 5 to 6 weeks  Patellofemoral syndrome, right Patient does have patellofemoral syndrome.  Does seem to be more secondary to  this as well as some tightness of the hamstring.  X-rays ordered.  Should pull lite brace given.  Discussed posture and ergonomics.  Worsening pain consider formal physical therapy and injections    Nonallopathic problems  Decision today to treat with OMT was based on Physical Exam  After verbal consent patient was treated with HVLA, ME, FPR techniques in cervical, rib, thoracic, lumbar, and sacral  areas  Patient tolerated the procedure well with improvement in symptoms  Patient given exercises, stretches and lifestyle modifications  See medications in patient instructions if given  Patient will follow up in 4-8 weeks      The above documentation has been reviewed and is accurate and complete Christina Pulley, DO       Note: This dictation was prepared with Dragon dictation along with smaller phrase technology. Any transcriptional errors that result from this process are unintentional.

## 2020-01-07 NOTE — Progress Notes (Unsigned)
Dg kyph

## 2020-01-07 NOTE — Patient Instructions (Signed)
Tru pull lite Left hip xray Right knee xray Voltaren gel 2x a day See me again in 5-6 weeks

## 2020-01-07 NOTE — Assessment & Plan Note (Signed)
Continues to have signs and symptoms consistent with a left lumbar radiculopathy.  We will get x-rays of the left hip to further evaluate for any arthritic changes that could be contributing.  We discussed posture and ergonomics.  Discussed different medications which patient does not like to take but did discuss Zanaflex.  Does respond fairly well to osteopathic manipulation.  Follow-up with me again in 5 to 6 weeks

## 2020-01-07 NOTE — Assessment & Plan Note (Signed)
Patient does have patellofemoral syndrome.  Does seem to be more secondary to this as well as some tightness of the hamstring.  X-rays ordered.  Should pull lite brace given.  Discussed posture and ergonomics.  Worsening pain consider formal physical therapy and injections

## 2020-01-09 ENCOUNTER — Encounter: Payer: Self-pay | Admitting: Family Medicine

## 2020-01-09 ENCOUNTER — Other Ambulatory Visit: Payer: Self-pay

## 2020-01-09 DIAGNOSIS — M25552 Pain in left hip: Secondary | ICD-10-CM

## 2020-01-11 ENCOUNTER — Encounter: Payer: Self-pay | Admitting: Family Medicine

## 2020-01-11 MED ORDER — DIAZEPAM 5 MG PO TABS: ORAL_TABLET | ORAL | 0 refills | Status: DC

## 2020-01-11 NOTE — Telephone Encounter (Signed)
Patient called stating that she will need this sent before 100 tomorrow (Saturday) because she will be at work and will not be able to pick it up after that.

## 2020-01-13 ENCOUNTER — Ambulatory Visit (INDEPENDENT_AMBULATORY_CARE_PROVIDER_SITE_OTHER): Payer: Federal, State, Local not specified - PPO

## 2020-01-13 ENCOUNTER — Other Ambulatory Visit: Payer: Self-pay

## 2020-01-13 DIAGNOSIS — G8929 Other chronic pain: Secondary | ICD-10-CM | POA: Diagnosis not present

## 2020-01-13 DIAGNOSIS — M25552 Pain in left hip: Secondary | ICD-10-CM

## 2020-01-13 DIAGNOSIS — M47818 Spondylosis without myelopathy or radiculopathy, sacral and sacrococcygeal region: Secondary | ICD-10-CM | POA: Diagnosis not present

## 2020-01-13 DIAGNOSIS — M533 Sacrococcygeal disorders, not elsewhere classified: Secondary | ICD-10-CM | POA: Diagnosis not present

## 2020-01-13 DIAGNOSIS — D259 Leiomyoma of uterus, unspecified: Secondary | ICD-10-CM | POA: Diagnosis not present

## 2020-02-17 NOTE — Progress Notes (Signed)
[place] Phone: [phone] Subjective:   Christina Christina [name], am serving as a scribe for Dr. Hulan Saas. This visit occurred during the SARS-CoV-2 public health emergency.  Safety protocols were in place, including screening questions prior to the visit, additional usage of staff PPE, and extensive cleaning of exam room while observing appropriate contact time as indicated for disinfecting solutions.   I'm seeing this patient by the request  of:  [name], Christina Brooke, NP  CC: Left hip, back pain and knee pain follow-up  [id number]  Christina Christina [name] is a 50 y.o. female coming in with complaint of back and neck pain. OMT 01/07/2020. Also following up for right knee pain. Patient states that she has been doing her exercises and wearing brace. Some tightness in upper back as she has been lying around due to vertigo.   Medications patient has been prescribed: Meloxicam, Zanaflex          Reviewed prior external information including notes and imaging from previsou exam, outside providers and external EMR if available.   As well as notes that were available from care everywhere and other healthcare systems.  Past medical history, social, surgical and family history all reviewed in electronic medical record.  Christina [name] pertanent information unless stated regarding to the chief complaint.   Past Medical History:  Diagnosis Date  . Hypercholesteremia   . Hyperthyroidism   . Migraines    with asthma  . Slipping rib syndrome     Allergies  Allergen Reactions  . Topamax [Topiramate]     Pain in legs, SOB, and almost passed out     Review of Systems:  Christina [name] headache, visual changes, nausea, vomiting, diarrhea, constipation, dizziness, abdominal pain, skin rash, fevers, chills, night sweats, weight loss, swollen lymph nodes, body aches, joint swelling, chest pain, shortness of breath, mood changes. POSITIVE muscle  aches  Objective  There were Christina [name] vitals taken for this visit.   General: Christina [name] apparent distress alert and oriented x3 mood and affect normal, dressed appropriately.  HEENT: Pupils equal, extraocular movements intact  Respiratory: Patient's speak in full sentences and does not appear short of breath  Cardiovascular: Christina [name] lower extremity edema, non tender, Christina [name] erythema  Neuro: Cranial nerves II through XII are intact, neurovascularly intact in all extremities with 2+ DTRs and 2+ pulses.  Gait normal with good balance and coordination.  MSK: Right knee exam shows the patient does have good range of motion.  Still some mild positive patellar grind test noted.  Mild lateral tracking of the patella but very minimal.  Full range of motion noted. Back -tightness noted in the paraspinal musculature lumbar spine left greater than right.  Mild tenderness over the left sacroiliac joint.  Patient has a negative straight leg test.  Patient does have tightness noted in the parascapular region right greater than left.  Negative Spurling's of the neck.  Osteopathic findings  C6 flexed rotated and side bent right T3 extended rotated and side bent right inhaled rib L3 flexed rotated and side bent right Sacrum left on left       Assessment and Plan:  Left lumbar radiculopathy Patient is stable at this time.  Responding fairly well to osteopathic manipulation.  Does have the meloxicam and Zanaflex.  Patient does have a fibroid uterus that could be potentially contributing to some of the discomfort patient is referred back to her regular OB/GYN.  Patient will follow up with me  again in 5 to 6 weeks  Patellofemoral syndrome, right Stable at this moment.  Christina [name] changes otherwise.  Fibroid uterus Seen on MRI referred back to OB/GYN    Nonallopathic problems  Decision today to treat with OMT was based on Physical Exam  After verbal consent patient was treated with HVLA, ME, FPR techniques in cervical, rib,  thoracic, lumbar, and sacral  areas  Patient tolerated the procedure well with improvement in symptoms  Patient given exercises, stretches and lifestyle modifications  See medications in patient instructions if given  Patient will follow up in 4-8 weeks      The above documentation has been reviewed and is accurate and complete Lyndal Pulley, DO       Note: This dictation was prepared with Dragon dictation along with smaller phrase technology. Any transcriptional errors that result from this process are unintentional.

## 2020-02-18 ENCOUNTER — Ambulatory Visit: Payer: Federal, State, Local not specified - PPO | Admitting: Endocrinology

## 2020-02-18 ENCOUNTER — Other Ambulatory Visit: Payer: Self-pay

## 2020-02-18 ENCOUNTER — Ambulatory Visit: Payer: Federal, State, Local not specified - PPO | Admitting: Family Medicine

## 2020-02-18 ENCOUNTER — Encounter: Payer: Self-pay | Admitting: Family Medicine

## 2020-02-18 VITALS — BP 130/80 | HR 79 | Ht 67.0 in | Wt 165.6 lb

## 2020-02-18 VITALS — BP 120/84 | HR 76 | Ht 67.0 in | Wt 165.0 lb

## 2020-02-18 DIAGNOSIS — E0590 Thyrotoxicosis, unspecified without thyrotoxic crisis or storm: Secondary | ICD-10-CM | POA: Diagnosis not present

## 2020-02-18 DIAGNOSIS — M999 Biomechanical lesion, unspecified: Secondary | ICD-10-CM | POA: Diagnosis not present

## 2020-02-18 DIAGNOSIS — M222X1 Patellofemoral disorders, right knee: Secondary | ICD-10-CM

## 2020-02-18 DIAGNOSIS — M5416 Radiculopathy, lumbar region: Secondary | ICD-10-CM | POA: Diagnosis not present

## 2020-02-18 DIAGNOSIS — D259 Leiomyoma of uterus, unspecified: Secondary | ICD-10-CM

## 2020-02-18 LAB — T4, FREE: Free T4: 0.75 ng/dL (ref 0.60–1.60)

## 2020-02-18 LAB — TSH: TSH: 2.47 u[IU]/mL (ref 0.35–4.50)

## 2020-02-18 NOTE — Assessment & Plan Note (Signed)
Patient is stable at this time.  Responding fairly well to osteopathic manipulation.  Does have the meloxicam and Zanaflex.  Patient does have a fibroid uterus that could be potentially contributing to some of the discomfort patient is referred back to her regular OB/GYN.  Patient will follow up with me again in 5 to 6 weeks

## 2020-02-18 NOTE — Patient Instructions (Signed)
Blood tests are requested for you today.  We'll let you know about the results.    Please come back for a follow-up appointment in 4 months.   

## 2020-02-18 NOTE — Patient Instructions (Signed)
Good to see you Let's get you back to regular routine See OB about fibroid See me again in 5 weeks

## 2020-02-18 NOTE — Assessment & Plan Note (Signed)
Stable at this moment.  No changes otherwise.

## 2020-02-18 NOTE — Progress Notes (Unsigned)
Subjective:    Patient ID: Christina [name]. Christina [name], female    DOB: January 04, 1971, 50 y.o.   MRN: [id number]  HPI Pt returns for f/u of hyperthyroidism (dx'ed in early 2015, in [place]; nuc med scan showed diffuse uptake (45% at 24 hrs); [place] in 2017 showed diffuse goiter, with multiple very small nodules; pt is unaware why tapazole was chosen as initial rx, but she chose to continue; it was d/c'ed in 2021, due to elev TSH).  pt states she feels well in general.  Specifically, she denies palpitations and tremor.  Past Medical History:  Diagnosis Date  . Hypercholesteremia   . Hyperthyroidism   . Migraines    with asthma  . Slipping rib syndrome     Past Surgical History:  Procedure Laterality Date  . BREAST BIOPSY Right 06-26-14   benign per patient  . COLONOSCOPY  08/03/2012   in [place] exam,melanosis  . UPPER GASTROINTESTINAL ENDOSCOPY  09/23/2016    Social History   Socioeconomic History  . Marital status: Married    Spouse name: Not on file  . Number of children: 1  . Years of education: 72  . Highest education level: Not on file  Occupational History  . Occupation: Geologist, engineering  Tobacco Use  . Smoking status: Never Smoker  . Smokeless tobacco: Never Used  Vaping Use  . Vaping Use: Never used  Substance and Sexual Activity  . Alcohol use: Not Currently    Alcohol/week: 0.0 standard drinks  . Drug use: No  . Sexual activity: Yes    Partners: Male    Birth control/protection: Condom  Other Topics Concern  . Not on file  Social History Narrative   Born and raised in [place].    Currently resides in a house with her child. No pets. Fun: Go to the movies.    Denies religious beliefs effecting health care.    Social Determinants of Health   Financial Resource Strain: Not on file  Food Insecurity: Not on file  Transportation Needs: Not on file  Physical Activity: Not on file  Stress: Not on file  Social Connections: Not on file  Intimate Partner Violence:  Not on file    Current Outpatient Medications on File Prior to Visit  Medication Sig Dispense Refill  . Carbamide Peroxide (EAR DROPS OT) Place in ear(s) as needed.     . diazepam (VALIUM) 5 MG tablet One tab by mouth, 2 hours before procedure. 2 tablet 0  . meloxicam (MOBIC) 7.5 MG tablet Take 1 tablet (7.5 mg total) by mouth daily. 30 tablet 0  . tiZANidine (ZANAFLEX) 4 MG tablet Take 1 tablet (4 mg total) by mouth at bedtime. 30 tablet 0   No current facility-administered medications on file prior to visit.    Allergies  Allergen Reactions  . Topamax [Topiramate]     Pain in legs, SOB, and almost passed out    Family History  Problem Relation Age of Onset  . Diabetes Mother   . Hypertension Mother   . Colon cancer Mother 17  . Breast cancer Mother 42  . Prostate cancer Father   . Colon cancer Maternal Aunt   . Colon cancer Maternal Aunt   . Stomach cancer Neg Hx   . Esophageal cancer Neg Hx   . Colon polyps Neg Hx   . Rectal cancer Neg Hx     BP 130/80 (BP Location: Right Arm, Patient Position: Sitting, Cuff Size: Normal)   Pulse 79  Ht 5\' 7"  (1.702 m)   Wt 165 lb 9.6 oz (75.1 kg)   SpO2 98%   BMI 25.94 kg/m    Review of Systems     Objective:   Physical Exam VITAL SIGNS:  See vs page GENERAL: no distress NECK: There is no palpable thyroid enlargement.  No thyroid nodule is palpable.  No palpable lymphadenopathy at the anterior neck.  Lab Results  Component Value Date   TSH 2.47 02/18/2020       Assessment & Plan:  Hyperthyroidism: stable off rx.  We'll follow.

## 2020-02-18 NOTE — Assessment & Plan Note (Signed)
Seen on MRI referred back to OB/GYN

## 2020-03-26 ENCOUNTER — Encounter: Payer: Self-pay | Admitting: Family Medicine

## 2020-03-26 ENCOUNTER — Ambulatory Visit: Payer: Federal, State, Local not specified - PPO | Admitting: Family Medicine

## 2020-03-26 ENCOUNTER — Other Ambulatory Visit: Payer: Self-pay

## 2020-03-26 VITALS — BP 100/82 | HR 78 | Ht 67.0 in | Wt 165.0 lb

## 2020-03-26 DIAGNOSIS — M999 Biomechanical lesion, unspecified: Secondary | ICD-10-CM

## 2020-03-26 DIAGNOSIS — M940 Chondrocostal junction syndrome [Tietze]: Secondary | ICD-10-CM

## 2020-03-26 NOTE — Assessment & Plan Note (Signed)
Patient had some mild increase in slipped rib syndrome again.  Patient did on the right side.  We discussed with patient about posture and ergonomics, which activities to do which wants to avoid.  Patient is responding very well to osteopathic manipulation.  Using gabapentin very infrequently.  Has muscle relaxer if needed.  Follow-up with me again 6 to 7 weeks

## 2020-03-26 NOTE — Patient Instructions (Signed)
Good to see you Glad you are doing better Keep using bands Monitor at eye level See me in 6-7 weeks

## 2020-03-26 NOTE — Progress Notes (Signed)
[place] Phone: [phone] Subjective:   [name], am serving as a scribe for Dr. Hulan Saas. This visit occurred during the SARS-CoV-2 public health emergency.  Safety protocols were in place, including screening questions prior to the visit, additional usage of staff PPE, and extensive cleaning of exam room while observing appropriate contact time as indicated for disinfecting solutions.   I'm seeing this patient by the request  of:  [name], Charlene Brooke, NP  CC: Neck and back pain follow-up  [id number]  Christina [name] is a 50 y.o. female coming in with complaint of back and neck pain. OMT 02/18/2020. Patient states that she did use meloxicam for 5 days in a row since last visit. Otherwise has been doing well.  Patient feels like she has been doing relatively well.  Trying to stay fairly active.  Not working out regularly though.  Medications patient has been prescribed: Meloxicam, Zanaflex  Taking: Intermittent         Reviewed prior external information including notes and imaging from previsou exam, outside providers and external EMR if available.   As well as notes that were available from care everywhere and other healthcare systems.  Past medical history, social, surgical and family history all reviewed in electronic medical record.  No pertanent information unless stated regarding to the chief complaint.   Past Medical History:  Diagnosis Date  . Hypercholesteremia   . Hyperthyroidism   . Migraines    with asthma  . Slipping rib syndrome     Allergies  Allergen Reactions  . Topamax [Topiramate]     Pain in legs, SOB, and almost passed out     Review of Systems:  No headache, visual changes, nausea, vomiting, diarrhea, constipation, dizziness, abdominal pain, skin rash, fevers, chills, night sweats, weight loss, swollen lymph nodes,  joint swelling, chest pain, shortness of  breath, mood changes. POSITIVE muscle aches, body aches  Objective  Blood pressure 100/82, pulse 78, height 5\' 7"  (1.702 m), weight 165 lb (74.8 kg), SpO2 98 %.   General: No apparent distress alert and oriented x3 mood and affect normal, dressed appropriately.  HEENT: Pupils equal, extraocular movements intact  Respiratory: Patient's speak in full sentences and does not appear short of breath  Cardiovascular: No lower extremity edema, non tender, no erythema  Neuro: Cranial nerves II through XII are intact, neurovascularly intact in all extremities with 2+ DTRs and 2+ pulses.  Gait normal with good balance and coordination.  MSK:  Non tender with full range of motion and good stability and symmetric strength and tone of shoulders, elbows, wrist, hip, knee and ankles bilaterally.  Back -low back exam does have some mild loss of lordosis.  No significant radicular symptoms.  Patient does have tightness of the piriformis right greater than left.  Tightness noted of the right parascapular region as well patient shoulder was fairly unremarkable.  Neck exam does have some mild loss of lordosis.   Osteopathic findings  C4 flexed rotated and side bent right T5 extended rotated and side bent right inhaled rib T9 extended rotated and side bent left L2 flexed rotated and side bent right Sacrum right on right       Assessment and Plan:  Slipped rib syndrome Patient had some mild increase in slipped rib syndrome again.  Patient did on the right side.  We discussed with patient about posture and ergonomics, which activities to do which wants  to avoid.  Patient is responding very well to osteopathic manipulation.  Using gabapentin very infrequently.  Has muscle relaxer if needed.  Follow-up with me again 6 to 7 weeks    Nonallopathic problems  Decision today to treat with OMT was based on Physical Exam  After verbal consent patient was treated with HVLA, ME, FPR techniques in cervical, rib,  thoracic, lumbar, and sacral  areas  Patient tolerated the procedure well with improvement in symptoms  Patient given exercises, stretches and lifestyle modifications  See medications in patient instructions if given  Patient will follow up in 4-8 weeks      The above documentation has been reviewed and is accurate and complete Lyndal Pulley, DO       Note: This dictation was prepared with Dragon dictation along with smaller phrase technology. Any transcriptional errors that result from this process are unintentional.

## 2020-04-15 ENCOUNTER — Other Ambulatory Visit: Payer: Self-pay

## 2020-04-16 ENCOUNTER — Encounter: Payer: Self-pay | Admitting: Nurse Practitioner

## 2020-04-16 ENCOUNTER — Ambulatory Visit: Payer: Federal, State, Local not specified - PPO | Admitting: Nurse Practitioner

## 2020-04-16 VITALS — BP 116/80 | HR 72 | Temp 96.9°F | Ht 67.0 in | Wt 166.2 lb

## 2020-04-16 DIAGNOSIS — N6452 Nipple discharge: Secondary | ICD-10-CM

## 2020-04-16 DIAGNOSIS — N6311 Unspecified lump in the right breast, upper outer quadrant: Secondary | ICD-10-CM | POA: Diagnosis not present

## 2020-04-16 NOTE — Progress Notes (Signed)
Subjective:  Patient ID: Christina [name]. Christina [name], female    DOB: [birth year]  Age: 50 y.o. MRN: [id number]  CC: Acute Visit (Pt c/o lump in right breast that she noticed 3 days ago. Denies pain, redness, or swelling. Lump has not changed in size over the past 3 days. )  HPI [name] present with right breast mass, first noticed 3days ago, denies any pain or swelling. Hx of left breast mass evaluated by diagnostic mammogram 09/2019: benign cyst, dense breast tissue, no distinct right breast mass at the time. She had mammogram completed yearly since 2017. Fhx of breast cancer (mother at 4yrs) and colon cancer (mother at 74) Her last colonoscopy 05/2019:normal, due for repeat in 104yrs No caffeine intake  Reviewed past Medical, Social and Family history today.  Outpatient Medications Prior to Visit  Medication Sig Dispense Refill  . Carbamide Peroxide (EAR DROPS OT) Place in ear(s) as needed.     . meloxicam (MOBIC) 7.5 MG tablet Take 1 tablet (7.5 mg total) by mouth daily. 30 tablet 0  . OVER THE COUNTER MEDICATION     . tiZANidine (ZANAFLEX) 4 MG tablet Take 1 tablet (4 mg total) by mouth at bedtime. 30 tablet 0   No facility-administered medications prior to visit.    ROS See HPI  Objective:  BP 116/80 (BP Location: Left Arm, Patient Position: Sitting, Cuff Size: Normal)   Pulse 72   Temp (!) 96.9 F (36.1 C) (Temporal)   Ht 5\' 7"  (1.702 m)   Wt 166 lb 3.2 oz (75.4 kg)   SpO2 98%   BMI 26.03 kg/m   Physical Exam Vitals reviewed. Exam conducted with a chaperone present.  Cardiovascular:     Rate and Rhythm: Normal rate.     Pulses: Normal pulses.  Pulmonary:     Effort: Pulmonary effort is normal.  Chest:  Breasts:     Right: Mass and nipple discharge present. No swelling, bleeding, inverted nipple, skin change, tenderness, axillary adenopathy or supraclavicular adenopathy.     Left: Mass present. No swelling, bleeding, inverted nipple, nipple discharge, skin change,  tenderness, axillary adenopathy or supraclavicular adenopathy.     Musculoskeletal:     Cervical back: Normal range of motion.  Lymphadenopathy:     Cervical: No cervical adenopathy.     Upper Body:     Right upper body: No supraclavicular, axillary or pectoral adenopathy.     Left upper body: No supraclavicular, axillary or pectoral adenopathy.  Skin:    General: Skin is warm and dry.     Findings: No erythema or rash.  Neurological:     Mental Status: She is alert and oriented to person, place, and time.    Assessment & Plan:  This visit occurred during the SARS-CoV-2 public health emergency.  Safety protocols were in place, including screening questions prior to the visit, additional usage of staff PPE, and extensive cleaning of exam room while observing appropriate contact time as indicated for disinfecting solutions.   Verdell was seen today for acute visit.  Diagnoses and all orders for this visit:  Mass of upper outer quadrant of right breast -     MM [facility]; Future -     US BREAST COMPLETE UNI RIGHT INC AXILLA; Future  Nipple discharge in female -     MM [facility]; Future -     US BREAST COMPLETE UNI RIGHT INC AXILLA; Future    Problem List Items Addressed This Visit  Other   Mass of upper outer quadrant of right breast - Primary   Relevant Orders   MM [facility]   US BREAST COMPLETE UNI RIGHT INC AXILLA    Other Visit Diagnoses    Nipple discharge in female       Relevant Orders   MM [facility]   US BREAST COMPLETE UNI RIGHT INC AXILLA      Follow-up: No follow-ups on file.  Wilfred Lacy, NP

## 2020-04-16 NOTE — Patient Instructions (Signed)
You will be contacted to schedule appt for mammogram and breast US We will order screening MRI breast after this is completed

## 2020-04-21 ENCOUNTER — Ambulatory Visit (HOSPITAL_BASED_OUTPATIENT_CLINIC_OR_DEPARTMENT_OTHER): Payer: Federal, State, Local not specified - PPO | Admitting: Radiology

## 2020-04-21 DIAGNOSIS — S0500XA Injury of conjunctiva and corneal abrasion without foreign body, unspecified eye, initial encounter: Secondary | ICD-10-CM | POA: Diagnosis not present

## 2020-04-22 DIAGNOSIS — H1032 Unspecified acute conjunctivitis, left eye: Secondary | ICD-10-CM | POA: Diagnosis not present

## 2020-04-24 ENCOUNTER — Other Ambulatory Visit: Payer: Self-pay

## 2020-04-24 ENCOUNTER — Ambulatory Visit
Admission: RE | Admit: 2020-04-24 | Discharge: 2020-04-24 | Disposition: A | Discharge status: Home / Self Care | Payer: Federal, State, Local not specified - PPO | Source: Ambulatory Visit | Attending: Nurse Practitioner | Admitting: Nurse Practitioner

## 2020-04-24 DIAGNOSIS — N6311 Unspecified lump in the right breast, upper outer quadrant: Secondary | ICD-10-CM

## 2020-04-24 DIAGNOSIS — N6011 Diffuse cystic mastopathy of right breast: Secondary | ICD-10-CM | POA: Diagnosis not present

## 2020-04-24 DIAGNOSIS — N6452 Nipple discharge: Secondary | ICD-10-CM | POA: Diagnosis not present

## 2020-04-24 DIAGNOSIS — R922 Inconclusive mammogram: Secondary | ICD-10-CM | POA: Diagnosis not present

## 2020-05-06 NOTE — Progress Notes (Signed)
Christina [name] Phone: [phone] Subjective:   Christina [name], am serving as a scribe for Dr. Hulan Saas. This visit occurred during the SARS-CoV-2 public health emergency.  Safety protocols were in place, including screening questions prior to the visit, additional usage of staff PPE, and extensive cleaning of exam room while observing appropriate contact time as indicated for disinfecting solutions.   I'm seeing this patient by the request  of:  [name], Charlene Brooke, NP  CC: Neck and back pain follow-up  [id number]  Christina [name] is a 50 y.o. female coming in with complaint of back and neck pain. OMT 03/26/2020. Patient states that she is having pain in R scapula and shoulder.  Stable overall.  Not stopping her from activity but it is giving her more discomfort even at night.  Denies any radiation down the arm.  Patient notes pain in R leg last night at work. Pain started in R SI joint that radiates into groin. Patient had to hold onto wall due to pain.  Patient states that this is the first time that it went down the posterior aspect of her leg in quite some time.  Then was able to get her to go away.  Patient continues to have tightness more around the right sacroiliac joint.  Medications patient has been prescribed: None         Reviewed prior external information including notes and imaging from previsou exam, outside providers and external EMR if available.   As well as notes that were available from care everywhere and other healthcare systems.  Past medical history, social, surgical and family history all reviewed in electronic medical record.  [name] pertanent information unless stated regarding to the chief complaint.   Past Medical History:  Diagnosis Date  . Hypercholesteremia   . Hyperthyroidism   . Migraines    with asthma  . Slipping rib syndrome     Allergies  Allergen Reactions  . Topamax  [Topiramate]     Pain in legs, SOB, and almost passed out     Review of Systems:  [name] headache, visual changes, nausea, vomiting, diarrhea, constipation, dizziness, abdominal pain, skin rash, fevers, chills, night sweats, weight loss, swollen lymph nodes, body aches, joint swelling, chest pain, shortness of breath, mood changes. POSITIVE muscle aches  Objective  Blood pressure 106/82, pulse 83, height 5\' 7"  (1.702 m), weight 167 lb (75.8 kg), last menstrual period 05/07/2020, SpO2 98 %.   General: [name] apparent distress alert and oriented x3 mood and affect normal, dressed appropriately.  HEENT: Pupils equal, extraocular movements intact  Respiratory: Patient's speak in full sentences and does not appear short of breath  Cardiovascular: [name] lower extremity edema, non tender, [name] erythema  Gait normal with good balance and coordination.  MSK:  Non tender with full range of motion and good stability and symmetric strength and tone of shoulders, elbows, wrist, hip, knee and ankles bilaterally.  Back -low back exam does have loss of lordosis still noted.  Patient does have significant tightness noted mostly of the piriformis right greater than left.  Some limited side bending as well as extension of the back.  5-5 strength of the lower extremities.  Mild tightness in the parascapular region right greater than left  Osteopathic findings  C3 flexed rotated and side bent right T3 extended rotated and side bent right inhaled rib L2 flexed rotated and side bent left  Sacrum right on right  Assessment and Plan:  SI (sacroiliac) joint dysfunction Patient has more pain over the sacroiliac joint again.  Seems to be tighter on the right side.  Patient has had difficulty with the left side.  [name] radicular symptoms at this point down either the leg on exam.  Discussed with patient about x-rays of the right hip today, discussed home exercises and icing regimen.  Follow-up again 6 weeks  Rib pain on  right side Right-sided rib pain underneath the scapula.  Likely contributing to some of the discomfort and pain.  Discussed icing regimen and home exercises, discussed avoiding certain activities.  Follow-up with me again 6 weeks    Nonallopathic problems  Decision today to treat with OMT was based on Physical Exam  After verbal consent patient was treated with HVLA, ME, FPR techniques in cervical, rib, thoracic, lumbar, and sacral  areas  Patient tolerated the procedure well with improvement in symptoms  Patient given exercises, stretches and lifestyle modifications  See medications in patient instructions if given  Patient will follow up in 4-8 weeks      The above documentation has been reviewed and is accurate and complete Lyndal Pulley, DO       Note: This dictation was prepared with Dragon dictation along with smaller phrase technology. Any transcriptional errors that result from this process are unintentional.

## 2020-05-07 ENCOUNTER — Ambulatory Visit: Payer: Federal, State, Local not specified - PPO | Admitting: Family Medicine

## 2020-05-07 ENCOUNTER — Ambulatory Visit (INDEPENDENT_AMBULATORY_CARE_PROVIDER_SITE_OTHER): Payer: Federal, State, Local not specified - PPO

## 2020-05-07 ENCOUNTER — Encounter: Payer: Self-pay | Admitting: Family Medicine

## 2020-05-07 ENCOUNTER — Other Ambulatory Visit: Payer: Self-pay

## 2020-05-07 VITALS — BP 106/82 | HR 83 | Ht 67.0 in | Wt 167.0 lb

## 2020-05-07 DIAGNOSIS — M9902 Segmental and somatic dysfunction of thoracic region: Secondary | ICD-10-CM

## 2020-05-07 DIAGNOSIS — M25511 Pain in right shoulder: Secondary | ICD-10-CM | POA: Diagnosis not present

## 2020-05-07 DIAGNOSIS — M9908 Segmental and somatic dysfunction of rib cage: Secondary | ICD-10-CM | POA: Diagnosis not present

## 2020-05-07 DIAGNOSIS — G8929 Other chronic pain: Secondary | ICD-10-CM

## 2020-05-07 DIAGNOSIS — M25551 Pain in right hip: Secondary | ICD-10-CM

## 2020-05-07 DIAGNOSIS — M533 Sacrococcygeal disorders, not elsewhere classified: Secondary | ICD-10-CM

## 2020-05-07 DIAGNOSIS — M9903 Segmental and somatic dysfunction of lumbar region: Secondary | ICD-10-CM

## 2020-05-07 DIAGNOSIS — R0781 Pleurodynia: Secondary | ICD-10-CM

## 2020-05-07 DIAGNOSIS — M9904 Segmental and somatic dysfunction of sacral region: Secondary | ICD-10-CM | POA: Diagnosis not present

## 2020-05-07 DIAGNOSIS — M9901 Segmental and somatic dysfunction of cervical region: Secondary | ICD-10-CM

## 2020-05-07 NOTE — Patient Instructions (Signed)
Good to se eyou  Watch the lifting Overall ok Xray of hip and right shoulder today  See me again in 6 weeks

## 2020-05-07 NOTE — Assessment & Plan Note (Signed)
>>  ASSESSMENT AND PLAN FOR RIB PAIN ON RIGHT SIDE WRITTEN ON 05/07/2020 12:07 PM BY Antoine Primas M, DO  Right-sided rib pain underneath the scapula.  Likely contributing to some of the discomfort and pain.  Discussed icing regimen and home exercises, discussed avoiding certain activities.  Follow-up with me again 6 weeks

## 2020-05-07 NOTE — Assessment & Plan Note (Signed)
Right-sided rib pain underneath the scapula.  Likely contributing to some of the discomfort and pain.  Discussed icing regimen and home exercises, discussed avoiding certain activities.  Follow-up with me again 6 weeks

## 2020-05-07 NOTE — Assessment & Plan Note (Signed)
Patient has more pain over the sacroiliac joint again.  Seems to be tighter on the right side.  Patient has had difficulty with the left side.  No radicular symptoms at this point down either the leg on exam.  Discussed with patient about x-rays of the right hip today, discussed home exercises and icing regimen.  Follow-up again 6 weeks

## 2020-05-07 NOTE — Assessment & Plan Note (Signed)
>>  ASSESSMENT AND PLAN FOR NONALLOPATHIC LESION-RIB CAGE WRITTEN ON 09/06/2022  2:50 PM BY Aidyn Kellis LUM, NP  >>ASSESSMENT AND PLAN FOR RIB PAIN ON RIGHT SIDE WRITTEN ON 05/07/2020 12:07 PM BY Antoine Primas M, DO  Right-sided rib pain underneath the scapula.  Likely contributing to some of the discomfort and pain.  Discussed icing regimen and home exercises, discussed avoiding certain activities.  Follow-up with me again 6 weeks

## 2020-06-16 ENCOUNTER — Ambulatory Visit: Payer: Federal, State, Local not specified - PPO | Admitting: Endocrinology

## 2020-06-18 NOTE — Progress Notes (Signed)
[place] Phone: [phone] Subjective:   Christina Christina [name], am serving as a scribe for Dr. Hulan [name]. This visit occurred during the SARS-CoV-2 public health emergency.  Safety protocols were in place, including screening questions prior to the visit, additional usage of staff PPE, and extensive cleaning of exam room while observing appropriate contact time as indicated for disinfecting solutions.   I'm seeing this patient by the request  of:  [name], Christina Brooke, NP  CC: Neck and back pain follow-up: Shoulder pain follow-up  [id number]  Christina Christina [name] is a 50 y.o. female coming in with complaint of back and neck pain. OMT 05/07/2020. Patient states that she has been doing well since last visit.  Patient states some very mild back pain but nothing severe at this time.  Nothing that stopping her from activity.  Does note a slight tightness if she stays in a position for too long.  Medications patient has been prescribed: None  Taking:  Patient did have x-rays of the right shoulder done at last exam in April 2022.  Christina [name] significant bony abnormality noted.  Independently visualized by me today        Reviewed prior external information including notes and imaging from previsou exam, outside providers and external EMR if available.   As well as notes that were available from care everywhere and other healthcare systems.  Past medical history, social, surgical and family history all reviewed in electronic medical record.  Christina [name] pertanent information unless stated regarding to the chief complaint.   Past Medical History:  Diagnosis Date  . Hypercholesteremia   . Hyperthyroidism   . Migraines    with asthma  . Slipping rib syndrome     Allergies  Allergen Reactions  . Topamax [Topiramate]     Pain in legs, SOB, and almost passed out     Review of Systems:  Christina [name] headache, visual changes, nausea,  vomiting, diarrhea, constipation, dizziness, abdominal pain, skin rash, fevers, chills, night sweats, weight loss, swollen lymph nodes, body aches, joint swelling, chest pain, shortness of breath, mood changes. POSITIVE muscle aches  Objective  Blood pressure 112/78, pulse 78, height 5\' 7"  (1.702 m), weight 165 lb (74.8 kg), SpO2 96 %.   General: Christina [name] apparent distress alert and oriented x3 mood and affect normal, dressed appropriately.  HEENT: Pupils equal, extraocular movements intact  Respiratory: Patient's speak in full sentences and does not appear short of breath  Cardiovascular: Christina [name] lower extremity edema, non tender, Christina [name] erythema  Gait normal with good balance and coordination.  MSK:  Non tender with full range of motion and good stability and symmetric strength and tone of shoulders, elbows, wrist, hip, knee and ankles bilaterally.  Back mild tightness noted more in the thoracolumbar juncture and minorly over the right sacroiliac joint.  Patient noted actually scapular region seems to be doing better than usual still some mild tightness on the right greater than left.  Osteopathic findings  C2 flexed rotated and side bent right T3 extended rotated and side bent right inhaled rib T9 extended rotated and side bent left L1 flexed rotated and side bent right Sacrum right on right       Assessment and Plan:  Low back pain Patient likely is not having any radicular symptoms at this time.  He does have the muscle relaxer for any breakthrough.  He is responding very well though to osteopathic manipulation.  Discussed with patient about  icing regimen and home exercises.  Patient will follow up with me again in 6 to 8 weeks    Nonallopathic problems  Decision today to treat with OMT was based on Physical Exam  After verbal consent patient was treated with HVLA, ME, FPR techniques in cervical, rib, thoracic, lumbar, and sacral  areas  Patient tolerated the procedure well with  improvement in symptoms  Patient given exercises, stretches and lifestyle modifications  See medications in patient instructions if given  Patient will follow up in 4-8 weeks    The above documentation has been reviewed and is accurate and complete Christina Pulley, DO       Note: This dictation was prepared with Dragon dictation along with smaller phrase technology. Any transcriptional errors that result from this process are unintentional.

## 2020-06-19 ENCOUNTER — Encounter: Payer: Self-pay | Admitting: Family Medicine

## 2020-06-19 ENCOUNTER — Ambulatory Visit: Payer: Federal, State, Local not specified - PPO | Admitting: Family Medicine

## 2020-06-19 ENCOUNTER — Other Ambulatory Visit: Payer: Self-pay

## 2020-06-19 VITALS — BP 112/78 | HR 78 | Ht 67.0 in | Wt 165.0 lb

## 2020-06-19 DIAGNOSIS — M9903 Segmental and somatic dysfunction of lumbar region: Secondary | ICD-10-CM | POA: Diagnosis not present

## 2020-06-19 DIAGNOSIS — G8929 Other chronic pain: Secondary | ICD-10-CM

## 2020-06-19 DIAGNOSIS — M9904 Segmental and somatic dysfunction of sacral region: Secondary | ICD-10-CM | POA: Diagnosis not present

## 2020-06-19 DIAGNOSIS — M5442 Lumbago with sciatica, left side: Secondary | ICD-10-CM | POA: Diagnosis not present

## 2020-06-19 DIAGNOSIS — M9908 Segmental and somatic dysfunction of rib cage: Secondary | ICD-10-CM

## 2020-06-19 DIAGNOSIS — M9902 Segmental and somatic dysfunction of thoracic region: Secondary | ICD-10-CM

## 2020-06-19 DIAGNOSIS — M9901 Segmental and somatic dysfunction of cervical region: Secondary | ICD-10-CM

## 2020-06-19 NOTE — Assessment & Plan Note (Signed)
Patient likely is not having any radicular symptoms at this time.  He does have the muscle relaxer for any breakthrough.  He is responding very well though to osteopathic manipulation.  Discussed with patient about icing regimen and home exercises.  Patient will follow up with me again in 6 to 8 weeks

## 2020-06-19 NOTE — Patient Instructions (Signed)
Continue stretches  Stay active See me in 6-8 weeks

## 2020-06-19 NOTE — Assessment & Plan Note (Signed)
>>  ASSESSMENT AND PLAN FOR LOW BACK PAIN WRITTEN ON 06/19/2020 10:58 AM BY Antoine Primas M, DO  Patient likely is not having any radicular symptoms at this time.  He does have the muscle relaxer for any breakthrough.  He is responding very well though to osteopathic manipulation.  Discussed with patient about icing regimen and home exercises.  Patient will follow up with me again in 6 to 8 weeks

## 2020-08-11 NOTE — Progress Notes (Signed)
[place] Phone: [phone] Subjective:   [name], am serving as a scribe for Dr. Hulan Saas.  This visit occurred during the SARS-CoV-2 public health emergency.  Safety protocols were in place, including screening questions prior to the visit, additional usage of staff PPE, and extensive cleaning of exam room while observing appropriate contact time as indicated for disinfecting solutions.    I'm seeing this patient by the request  of:  [name], Charlene Brooke, NP  CC: Back pain follow-up  [id number]  Christina [name] is a 50 y.o. female coming in with complaint of back and neck pain. OMT 06/19/2020. Patient states that she has been doing fine since last visit.  Patient has been doing relatively well.  No significant discomfort or pain.  Some mild tightness.  There.  Will be traveling.  Medications patient has been prescribed: None          Reviewed prior external information including notes and imaging from previsou exam, outside providers and external EMR if available.   As well as notes that were available from care everywhere and other healthcare systems.  Past medical history, social, surgical and family history all reviewed in electronic medical record.  No pertanent information unless stated regarding to the chief complaint.   Past Medical History:  Diagnosis Date   Hypercholesteremia    Hyperthyroidism    Migraines    with asthma   Slipping rib syndrome     Allergies  Allergen Reactions   Topamax [Topiramate]     Pain in legs, SOB, and almost passed out     Review of Systems:  No headache, visual changes, nausea, vomiting, diarrhea, constipation, dizziness, abdominal pain, skin rash, fevers, chills, night sweats, weight loss, swollen lymph nodes, body aches, joint swelling, chest pain, shortness of breath, mood changes. POSITIVE muscle aches  Objective  Blood pressure  110/76, pulse 75, height 5\' 7"  (1.702 m), weight 166 lb (75.3 kg), SpO2 98 %.   General: No apparent distress alert and oriented x3 mood and affect normal, dressed appropriately.  HEENT: Pupils equal, extraocular movements intact  Respiratory: Patient's speak in full sentences and does not appear short of breath  Cardiovascular: No lower extremity edema, non tender, no erythema  Low back exam does have loss of lordosis.  Tenderness to palpation in the paraspinal musculature of the lumbar spine right greater than left.  Tightness with FABER test right greater than left.  Negative straight leg test.  Osteopathic findings  C2 flexed rotated and side bent right T4 extended rotated and side bent right inhaled rib T8 extended rotated and side bent left L2 flexed rotated and side bent right Sacrum right on right     Assessment and Plan:  Low back pain Mild tightness.  Seems to be more in the thoracolumbar juncture than anywhere else.Marland Kitchen  Has responded very well other conservative therapy.  Increase activity as tolerated.  Follow-up again in 10 weeks.   Nonallopathic problems  Decision today to treat with OMT was based on Physical Exam  After verbal consent patient was treated with HVLA, ME, FPR techniques in cervical, rib, thoracic, lumbar, and sacral  areas  Patient tolerated the procedure well with improvement in symptoms  Patient given exercises, stretches and lifestyle modifications  See medications in patient instructions if given  Patient will follow up in 4-8 weeks      The above documentation has been reviewed and is accurate  and complete Lyndal Pulley, DO        Note: This dictation was prepared with Dragon dictation along with smaller phrase technology. Any transcriptional errors that result from this process are unintentional.

## 2020-08-12 ENCOUNTER — Encounter: Payer: Self-pay | Admitting: Family Medicine

## 2020-08-12 ENCOUNTER — Ambulatory Visit: Payer: Federal, State, Local not specified - PPO | Admitting: Family Medicine

## 2020-08-12 ENCOUNTER — Other Ambulatory Visit: Payer: Self-pay

## 2020-08-12 VITALS — BP 110/76 | HR 75 | Ht 67.0 in | Wt 166.0 lb

## 2020-08-12 DIAGNOSIS — M9902 Segmental and somatic dysfunction of thoracic region: Secondary | ICD-10-CM | POA: Diagnosis not present

## 2020-08-12 DIAGNOSIS — M9908 Segmental and somatic dysfunction of rib cage: Secondary | ICD-10-CM

## 2020-08-12 DIAGNOSIS — M9901 Segmental and somatic dysfunction of cervical region: Secondary | ICD-10-CM

## 2020-08-12 DIAGNOSIS — M9904 Segmental and somatic dysfunction of sacral region: Secondary | ICD-10-CM

## 2020-08-12 DIAGNOSIS — M9903 Segmental and somatic dysfunction of lumbar region: Secondary | ICD-10-CM | POA: Diagnosis not present

## 2020-08-12 DIAGNOSIS — G8929 Other chronic pain: Secondary | ICD-10-CM

## 2020-08-12 DIAGNOSIS — M5442 Lumbago with sciatica, left side: Secondary | ICD-10-CM | POA: Diagnosis not present

## 2020-08-12 NOTE — Assessment & Plan Note (Signed)
>>  ASSESSMENT AND PLAN FOR LOW BACK PAIN WRITTEN ON 08/12/2020 10:16 AM BY Antoine Primas M, DO  Mild tightness.  Seems to be more in the thoracolumbar juncture than anywhere else.Christina [name]  Has responded very well other conservative therapy.  Increase activity as tolerated.  Follow-up again in 10 weeks.

## 2020-08-12 NOTE — Assessment & Plan Note (Signed)
Mild tightness.  Seems to be more in the thoracolumbar juncture than anywhere else.Marland Kitchen  Has responded very well other conservative therapy.  Increase activity as tolerated.  Follow-up again in 10 weeks.

## 2020-08-12 NOTE — Patient Instructions (Signed)
Enjoy visiting the house that [place] See me again in 10 weeks

## 2020-09-30 ENCOUNTER — Other Ambulatory Visit: Payer: Self-pay | Admitting: Nurse Practitioner

## 2020-09-30 DIAGNOSIS — Z1231 Encounter for screening mammogram for malignant neoplasm of breast: Secondary | ICD-10-CM

## 2020-10-02 ENCOUNTER — Ambulatory Visit: Payer: Federal, State, Local not specified - PPO

## 2020-10-09 NOTE — Progress Notes (Signed)
50 y.o. [id number] Married Black or [place] American Not Hispanic or Latino female here for annual exam. Patient states that she has been spotting for the last two days.   Up until this last month her cycles were normal. She started bleeding on 10/02/20, it was a normal cycle, but was a couple of weeks late. Yesterday and today she has had minimal spotting.  Sexually active, no pain. She has a decreased libido. Able to orgasm.  Period Cycle (Days): 30 Period Duration (Days): 3-4 Period Pattern: (!) Irregular Menstrual Flow: Moderate Menstrual Control: Thin pad Menstrual Control Change Freq (Hours): 6  Patient's last menstrual period was 10/02/2020.          Sexually active: Yes.    The current method of family planning is none.    Exercising: No.  The patient does not participate in regular exercise at present. Smoker:  no  Health Maintenance: Pap:   08/29/18 negative, negative HPV testing; 07/29/16 WNL 07/10/14 Wnl Hr HPV Neg  History of abnormal Pap:  no MMG:  04/24/20 diag right breast Bi-rads 2 benign  BMD:   none  Colonoscopy: 06/12/19 normal, 5 year f/u.  TDaP:  05/27/14  Gardasil: n/a   reports that she has never smoked. She has never used smokeless tobacco. She reports that she does not currently use alcohol. She reports that she does not use drugs. Son is 92, lives locally. 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   Past Medical History:  Diagnosis Date   Hypercholesteremia    Hyperthyroidism    Migraines    with asthma   Slipping rib syndrome     Past Surgical History:  Procedure Laterality Date   BREAST BIOPSY Right 06-26-14   benign per patient   COLONOSCOPY  08/03/2012   in [place] ENDOSCOPY  09/23/2016    Current Outpatient Medications  Medication Sig Dispense Refill   OVER THE COUNTER MEDICATION      tiZANidine (ZANAFLEX) 4 MG tablet Take 1 tablet (4 mg total) by mouth at bedtime. 30 tablet 0    Carbamide Peroxide (EAR DROPS OT) Place in ear(s) as needed.      No current facility-administered medications for this visit.    Family History  Problem Relation Age of Onset   Diabetes Mother    Hypertension Mother    Colon cancer Mother 25   Breast cancer Mother 32   Prostate cancer Father    Colon cancer Maternal Aunt    Colon cancer Maternal Aunt    Stomach cancer Neg Hx    Esophageal cancer Neg Hx    Colon polyps Neg Hx    Rectal cancer Neg Hx   Mom was one of 4 girls, she was the only one with breast cancer. One of her maternal first cousins with breast cancer.  Couple of her aunts also had colon cancer.   The patient declines genetic counseling.   Review of Systems  Genitourinary:  Positive for vaginal bleeding.   Exam:   BP 110/64   Pulse 82   Ht '5\' 7"'$  (1.702 m)   Wt 162 lb (73.5 kg)   LMP 10/02/2020   SpO2 98%   BMI 25.37 kg/m   Weight change: '@WEIGHTCHANGE'$ @ Height:   Height: '5\' 7"'$  (170.2 cm)  Ht Readings from Last 3 Encounters:  10/13/20 '5\' 7"'$  (1.702 m)  08/12/20 '5\' 7"'$  (1.702 m)  06/19/20 '5\' 7"'$  (1.702 m)  General appearance: alert, cooperative and appears stated age Head: Normocephalic, without obvious abnormality, atraumatic Neck: no adenopathy, supple, symmetrical, trachea midline and thyroid normal to inspection and palpation Lungs: clear to auscultation bilaterally Cardiovascular: regular rate and rhythm Breasts:  in the right breast at 12 o'clock is a smooth, mobile 2 lump. In the left breast at 2 o'clock is a 3 cm, smooth, mobile lump. Known cysts on prior imaging.   Abdomen: soft, non-tender; non distended,  no masses,  no organomegaly Extremities: extremities normal, atraumatic, no cyanosis or edema Skin: Skin color, texture, turgor normal. No rashes or lesions Lymph nodes: Cervical, supraclavicular, and axillary nodes normal. No abnormal inguinal nodes palpated Neurologic: Grossly normal   Pelvic: External genitalia:  no lesions               Urethra:  normal appearing urethra with no masses, tenderness or lesions              Bartholins and Skenes: normal                 Vagina: normal appearing vagina with normal color and discharge, no lesions              Cervix: no lesions               Bimanual Exam:  Uterus:  normal size, contour, position, consistency, mobility, non-tender              Adnexa: no mass, fullness, tenderness               Rectovaginal: Confirms               Anus:  normal sphincter tone, no lesions  Gae Dry chaperoned for the exam.  1. Well woman exam Discussed breast self exam Discussed calcium and vit D intake Labs with primary Mammogram, pap and colonoscopy UTD  2. Family history of breast cancer Mom with breast cancer at 16, then developed colon cancer. Also with cousin with breast cancer. Discussed genetic counseling, she declines. We discussed how counseling could change management.  If she changes her mind about genetic counseling she will reach out.  We discussed that with a risk over 20% she would qualify for breast MRI's and with a risk between 15-19% she could get abbreviated MRI's.   3. Family history of colon cancer Several family members. Patient getting colonoscopies.   4. Decreased libido Information given  5. Bilateral breast cysts She is up to date on imaging.

## 2020-10-13 ENCOUNTER — Ambulatory Visit (INDEPENDENT_AMBULATORY_CARE_PROVIDER_SITE_OTHER): Payer: Federal, State, Local not specified - PPO | Admitting: Obstetrics and Gynecology

## 2020-10-13 ENCOUNTER — Encounter: Payer: Self-pay | Admitting: Obstetrics and Gynecology

## 2020-10-13 ENCOUNTER — Other Ambulatory Visit: Payer: Self-pay

## 2020-10-13 VITALS — BP 110/64 | HR 82 | Ht 67.0 in | Wt 162.0 lb

## 2020-10-13 DIAGNOSIS — N6001 Solitary cyst of right breast: Secondary | ICD-10-CM

## 2020-10-13 DIAGNOSIS — Z01419 Encounter for gynecological examination (general) (routine) without abnormal findings: Secondary | ICD-10-CM

## 2020-10-13 DIAGNOSIS — R6882 Decreased libido: Secondary | ICD-10-CM

## 2020-10-13 DIAGNOSIS — Z800 Family history of malignant neoplasm of digestive organs: Secondary | ICD-10-CM | POA: Diagnosis not present

## 2020-10-13 DIAGNOSIS — N6002 Solitary cyst of left breast: Secondary | ICD-10-CM

## 2020-10-13 DIAGNOSIS — Z803 Family history of malignant neoplasm of breast: Secondary | ICD-10-CM

## 2020-10-13 NOTE — Patient Instructions (Signed)

## 2020-10-17 NOTE — Progress Notes (Signed)
  Christina [name] [place] [address] [place] Phone: [phone] Subjective:   Christina [name], am serving as a scribe for Dr. Hulan Saas. This visit occurred during the SARS-CoV-2 public health emergency.  Safety protocols were in place, including screening questions prior to the visit, additional usage of staff PPE, and extensive cleaning of exam room while observing appropriate contact time as indicated for disinfecting solutions.   I'm seeing this patient by the request  of:  [name], Charlene Brooke, NP  CC: Back pain follow-up  [id number]  Christina [name] is a 50 y.o. female coming in with complaint of back and neck pain. OMT on 08/12/2020. Patient states back and neck pain remain the same. No new complaints patient has been able to be more active.  Doing a little less lifting at work which she thinks has been beneficial.  Medications patient has been prescribed: None         Past Medical History:  Diagnosis Date   Hypercholesteremia    Hyperthyroidism    Migraines    with asthma   Slipping rib syndrome     Allergies  Allergen Reactions   Topamax [Topiramate]     Pain in legs, SOB, and almost passed out     Review of Systems:  No headache, visual changes, nausea, vomiting, diarrhea, constipation, dizziness, abdominal pain, skin rash, fevers, chills, night sweats, weight loss, swollen lymph nodes, body aches, joint swelling, chest pain, shortness of breath, mood changes. POSITIVE muscle aches  Objective  Blood pressure 118/78, pulse 85, height 5\' 7"  (1.702 m), weight 162 lb (73.5 kg), last menstrual period 10/02/2020, SpO2 98 %.   General: No apparent distress alert and oriented x3 mood and affect normal, dressed appropriately.  HEENT: Pupils equal, extraocular movements intact  Respiratory: Patient's speak in full sentences and does not appear short of breath  Cardiovascular: No lower extremity edema, non tender, no erythema   Patient still has tightness noted in the parascapular region on the right greater than left.  Negative Spurling's of the neck but lacks last 5 degrees of extension.  Osteopathic findings  C2 flexed rotated and side bent right C6 flexed rotated and side bent right T3 extended rotated and side bent right inhaled rib L2 flexed rotated and side bent right Sacrum right on right       Assessment and Plan:  Slipped rib syndrome Chronic problem that has responded very well to osteopathic manipulation.  Patient overall has been doing well but still needed the manipulation.  Discussed with patient about different treatment options but at the moment patient is doing very well.  Follow-up with me again in 10 to 12 weeks otherwise.   Nonallopathic problems  Decision today to treat with OMT was based on Physical Exam  After verbal consent patient was treated with HVLA, ME, FPR techniques in cervical, rib, thoracic, lumbar, and sacral  areas  Patient tolerated the procedure well with improvement in symptoms  Patient given exercises, stretches and lifestyle modifications  See medications in patient instructions if given  Patient will follow up in 4-8 weeks     The above documentation has been reviewed and is accurate and complete Lyndal Pulley, DO        Note: This dictation was prepared with Dragon dictation along with smaller phrase technology. Any transcriptional errors that result from this process are unintentional.

## 2020-10-20 ENCOUNTER — Ambulatory Visit: Payer: Federal, State, Local not specified - PPO | Admitting: Family Medicine

## 2020-10-20 ENCOUNTER — Other Ambulatory Visit: Payer: Self-pay

## 2020-10-20 VITALS — BP 118/78 | HR 85 | Ht 67.0 in | Wt 162.0 lb

## 2020-10-20 DIAGNOSIS — M9902 Segmental and somatic dysfunction of thoracic region: Secondary | ICD-10-CM

## 2020-10-20 DIAGNOSIS — M940 Chondrocostal junction syndrome [Tietze]: Secondary | ICD-10-CM | POA: Diagnosis not present

## 2020-10-20 DIAGNOSIS — M9901 Segmental and somatic dysfunction of cervical region: Secondary | ICD-10-CM

## 2020-10-20 DIAGNOSIS — M9903 Segmental and somatic dysfunction of lumbar region: Secondary | ICD-10-CM

## 2020-10-20 DIAGNOSIS — M9904 Segmental and somatic dysfunction of sacral region: Secondary | ICD-10-CM | POA: Diagnosis not present

## 2020-10-20 DIAGNOSIS — M9908 Segmental and somatic dysfunction of rib cage: Secondary | ICD-10-CM

## 2020-10-20 NOTE — Patient Instructions (Signed)
Good to see you! Heat and massage to finger Back and neck are doing fantastic See you again in 10-12 weeks

## 2020-10-20 NOTE — Assessment & Plan Note (Signed)
Chronic problem that has responded very well to osteopathic manipulation.  Patient overall has been doing well but still needed the manipulation.  Discussed with patient about different treatment options but at the moment patient is doing very well.  Follow-up with me again in 10 to 12 weeks otherwise.

## 2020-11-03 ENCOUNTER — Other Ambulatory Visit: Payer: Self-pay

## 2020-11-03 ENCOUNTER — Ambulatory Visit
Admission: RE | Admit: 2020-11-03 | Discharge: 2020-11-03 | Disposition: A | Discharge status: Home / Self Care | Payer: Federal, State, Local not specified - PPO | Source: Ambulatory Visit | Attending: Nurse Practitioner | Admitting: Nurse Practitioner

## 2020-11-03 DIAGNOSIS — Z1231 Encounter for screening mammogram for malignant neoplasm of breast: Secondary | ICD-10-CM

## 2020-11-06 ENCOUNTER — Other Ambulatory Visit: Payer: Self-pay | Admitting: Nurse Practitioner

## 2020-11-06 DIAGNOSIS — R928 Other abnormal and inconclusive findings on diagnostic imaging of breast: Secondary | ICD-10-CM

## 2020-11-17 ENCOUNTER — Other Ambulatory Visit: Payer: Self-pay

## 2020-11-17 ENCOUNTER — Ambulatory Visit
Admission: RE | Admit: 2020-11-17 | Discharge: 2020-11-17 | Disposition: A | Discharge status: Home / Self Care | Payer: Federal, State, Local not specified - PPO | Source: Ambulatory Visit | Attending: Nurse Practitioner | Admitting: Nurse Practitioner

## 2020-11-17 DIAGNOSIS — N6002 Solitary cyst of left breast: Secondary | ICD-10-CM | POA: Diagnosis not present

## 2020-11-17 DIAGNOSIS — R928 Other abnormal and inconclusive findings on diagnostic imaging of breast: Secondary | ICD-10-CM

## 2020-12-03 DIAGNOSIS — J019 Acute sinusitis, unspecified: Secondary | ICD-10-CM | POA: Diagnosis not present

## 2020-12-03 DIAGNOSIS — R42 Dizziness and giddiness: Secondary | ICD-10-CM | POA: Diagnosis not present

## 2020-12-09 ENCOUNTER — Other Ambulatory Visit: Payer: Self-pay

## 2020-12-09 ENCOUNTER — Encounter: Payer: Self-pay | Admitting: Nurse Practitioner

## 2020-12-09 ENCOUNTER — Ambulatory Visit (INDEPENDENT_AMBULATORY_CARE_PROVIDER_SITE_OTHER): Payer: Federal, State, Local not specified - PPO | Admitting: Nurse Practitioner

## 2020-12-09 VITALS — BP 114/78 | HR 72 | Temp 96.9°F | Ht 67.0 in | Wt 168.2 lb

## 2020-12-09 DIAGNOSIS — D72829 Elevated white blood cell count, unspecified: Secondary | ICD-10-CM

## 2020-12-09 DIAGNOSIS — E7800 Pure hypercholesterolemia, unspecified: Secondary | ICD-10-CM

## 2020-12-09 DIAGNOSIS — Z0001 Encounter for general adult medical examination with abnormal findings: Secondary | ICD-10-CM

## 2020-12-09 DIAGNOSIS — E0590 Thyrotoxicosis, unspecified without thyrotoxic crisis or storm: Secondary | ICD-10-CM | POA: Diagnosis not present

## 2020-12-09 DIAGNOSIS — M5416 Radiculopathy, lumbar region: Secondary | ICD-10-CM

## 2020-12-09 DIAGNOSIS — E559 Vitamin D deficiency, unspecified: Secondary | ICD-10-CM

## 2020-12-09 LAB — COMPREHENSIVE METABOLIC PANEL
ALT: 20 U/L (ref 0–35)
AST: 12 U/L (ref 0–37)
Albumin: 4.1 g/dL (ref 3.5–5.2)
Alkaline Phosphatase: 37 U/L — ABNORMAL LOW (ref 39–117)
BUN: 14 mg/dL (ref 6–23)
CO2: 32 mEq/L (ref 19–32)
Calcium: 8.8 mg/dL (ref 8.4–10.5)
Chloride: 103 mEq/L (ref 96–112)
Creatinine, Ser: 1.03 mg/dL (ref 0.40–1.20)
GFR: 63 mL/min (ref >60.00)
Glucose, Bld: 79 mg/dL (ref 70–99)
Potassium: 3.6 mEq/L (ref 3.5–5.1)
Sodium: 139 mEq/L (ref 135–145)
Total Bilirubin: 0.4 mg/dL (ref 0.2–1.2)
Total Protein: 6.7 g/dL (ref 6.0–8.3)

## 2020-12-09 LAB — CBC WITH DIFFERENTIAL/PLATELET
Basophils Absolute: 0 10*3/uL (ref 0.0–0.1)
Basophils Relative: 0.2 % (ref 0.0–3.0)
Eosinophils Absolute: 0 10*3/uL (ref 0.0–0.7)
Eosinophils Relative: 0.1 % (ref 0.0–5.0)
HCT: 39.6 % (ref 36.0–46.0)
Hemoglobin: 12.9 g/dL (ref 12.0–15.0)
Lymphocytes Relative: 23.4 % (ref 12.0–46.0)
Lymphs Abs: 2.8 10*3/uL (ref 0.7–4.0)
MCHC: 32.6 g/dL (ref 30.0–36.0)
MCV: 87 fl (ref 78.0–100.0)
Monocytes Absolute: 1.1 10*3/uL — ABNORMAL HIGH (ref 0.1–1.0)
Monocytes Relative: 9.2 % (ref 3.0–12.0)
Neutro Abs: 8.1 10*3/uL — ABNORMAL HIGH (ref 1.4–7.7)
Neutrophils Relative %: 67.1 % (ref 43.0–77.0)
Platelets: 218 10*3/uL (ref 150.0–400.0)
RBC: 4.55 Mil/uL (ref 3.87–5.11)
RDW: 12.9 % (ref 11.5–15.5)
WBC: 12.1 10*3/uL — ABNORMAL HIGH (ref 4.0–10.5)

## 2020-12-09 LAB — LIPID PANEL
Cholesterol: 136 mg/dL (ref 0–200)
HDL: 39.9 mg/dL (ref >39.00)
LDL Cholesterol: 75 mg/dL (ref 0–99)
NonHDL: 96.45
Total CHOL/HDL Ratio: 3
Triglycerides: 109 mg/dL (ref 0.0–149.0)
VLDL: 21.8 mg/dL (ref 0.0–40.0)

## 2020-12-09 LAB — T4, FREE: Free T4: 0.85 ng/dL (ref 0.60–1.60)

## 2020-12-09 LAB — TSH: TSH: 3.86 u[IU]/mL (ref 0.35–5.50)

## 2020-12-09 LAB — VITAMIN D 25 HYDROXY (VIT D DEFICIENCY, FRACTURES): VITD: 12.87 ng/mL — ABNORMAL LOW (ref 30.00–100.00)

## 2020-12-09 MED ORDER — CYCLOBENZAPRINE HCL 5 MG PO TABS: 5.0000 mg | ORAL_TABLET | Freq: Every evening | ORAL | 0 refills | Status: DC | PRN

## 2020-12-09 NOTE — Assessment & Plan Note (Addendum)
Repeat vit. D: low 50000IU weekly x 8weeks sent

## 2020-12-09 NOTE — Assessment & Plan Note (Addendum)
Repeat lipid panel: continue heart health diet and daily exercise. Lipid Panel     Component Value Date/Time   CHOL 136 12/09/2020 1234   CHOL 131 08/29/2018 1202   TRIG 109.0 12/09/2020 1234   HDL 39.90 12/09/2020 1234   HDL 36 (L) 08/29/2018 1202   CHOLHDL 3 12/09/2020 1234   VLDL 21.8 12/09/2020 1234   LDLCALC 75 12/09/2020 1234   LDLCALC 87 08/29/2018 1202   LABVLDL 8 08/29/2018 1202

## 2020-12-09 NOTE — Assessment & Plan Note (Addendum)
Methimazole discontinued 1year ago, Repeat TSh and T4: normal Repeat in 39months

## 2020-12-09 NOTE — Assessment & Plan Note (Signed)
Chronic, waxing and waning, worse in sitting and laying position. Improves with repositioning No pain with walking. Reviewed previous MRI and DG lumbar spine, hip DG: no acute finding. Advised to resume home back and leg exercise as directed by sports medicine. Refer for outot PT if no improvement in 4-6weeks

## 2020-12-09 NOTE — Progress Notes (Signed)
Subjective:    Patient ID: Christina [name]. [place], female    DOB: [birth year], 50 y.o.   MRN: [id number]  Patient presents today for CPE and eval of chronic conditions  HPI Hypercholesterolemia Repeat lipid panel: continue heart health diet and daily exercise. Lipid Panel     Component Value Date/Time   CHOL 136 12/09/2020 1234   CHOL 131 08/29/2018 1202   TRIG 109.0 12/09/2020 1234   HDL 39.90 12/09/2020 1234   HDL 36 (L) 08/29/2018 1202   CHOLHDL 3 12/09/2020 1234   VLDL 21.8 12/09/2020 1234   LDLCALC 75 12/09/2020 1234   LDLCALC 87 08/29/2018 1202   LABVLDL 8 08/29/2018 1202    Vitamin D deficiency Repeat vit. D: low 50000IU weekly x 8weeks sent  Left lumbar radiculopathy Chronic, waxing and waning, worse in sitting and laying position. Improves with repositioning No pain with walking. Reviewed previous MRI and DG lumbar spine, hip DG: no acute finding. Advised to resume home back and leg exercise as directed by sports medicine. Refer for outot PT if no improvement in 4-6weeks   Hyperthyroidism Methimazole discontinued 1year ago, Repeat TSh and T4: normal Repeat in 27months  Vision:up to date Dental:up to date Diet:heart healthy mostly, needs to work on less carb Exercise:none Weight:  Wt Readings from Last 3 Encounters:  12/09/20 168 lb 3.2 oz (76.3 kg)  10/20/20 162 lb (73.5 kg)  10/13/20 162 lb (73.5 kg)    Sexual History (orientation,birth control, marital status, STD):up to date with mammogram and PAP smear and colon cancer screen. No need for STD screen  Depression/Suicide: Depression screen [facility] 2/9 12/09/2020 08/20/2016  Decreased Interest 0 0  Down, Depressed, Hopeless 0 0  PHQ - 2 Score 0 0   Immunizations: (TDAP, Hep C screen, Pneumovax, Influenza, zoster)  Health Maintenance  Topic Date Due   Zoster (Shingles) Vaccine (1 of 2) 03/11/2021*   Flu Shot  04/24/2021*   Pneumococcal Vaccination (1 - PCV) 12/09/2021*   Hepatitis C Screening: USPSTF  Recommendation to screen - Ages 18-79 yo.  12/09/2021*   HIV Screening  12/09/2021*   Pap Smear  08/28/2021   Mammogram  11/03/2021   Tetanus Vaccine  05/26/2024   Colon Cancer Screening  06/11/2024   HPV Vaccine  Aged Out  *Topic was postponed. The date shown is not the original due date.   Fall Risk: Fall Risk  02/21/2018 02/16/2017 02/12/2015  Falls in the past year? 0 No No  Follow up Falls evaluation completed - -   Advanced Directive: Advanced Directives 12/09/2020  Does Patient Have a Medical Advance Directive? No  Would patient like information on creating a medical advance directive? Yes (MAU/Ambulatory/Procedural Areas - Information given)    Medications and allergies reviewed with patient and updated if appropriate.  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     OVER THE COUNTER MEDICATION      No  current facility-administered medications on file prior to visit.   Past Medical History:  Diagnosis Date   GERD (gastroesophageal reflux disease) 2018   Hypercholesteremia    Hyperthyroidism    Migraines    with asthma   Slipping rib syndrome     Past Surgical History:  Procedure Laterality Date   BREAST BIOPSY Right 06/26/2014   benign per patient   COLONOSCOPY  08/03/2012   in [place] exam,melanosis   UPPER GASTROINTESTINAL ENDOSCOPY  09/23/2016    Social History   Socioeconomic History   Marital status: Married    Spouse name: Not on file   Number of children: 1   Years of education: 14   Highest education level: Not on file  Occupational History   Occupation: Geologist, engineering  Tobacco Use   Smoking status: Never   Smokeless tobacco: Never  Vaping Use   Vaping Use: Never used  Substance and Sexual Activity   Alcohol use: Yes   Drug use: No   Sexual activity: Yes    Partners: Male    Birth control/protection: Condom  Other Topics Concern   Not on file  Social History Narrative   Born and raised in [place].    Currently resides in a house with her child. No pets. Fun: Go to the movies.    Denies religious beliefs effecting health care.    Social Determinants of Health   Financial Resource Strain: Not on file  Food Insecurity: Not on file  Transportation Needs: Not on file  Physical Activity: Not on file  Stress: Not on file  Social Connections: Not on file    Family History  Problem Relation Age of Onset   Diabetes Mother    Hypertension Mother    Colon cancer Mother 51   Breast cancer Mother 22   Cancer Mother    Prostate cancer Father    Cancer Father    Colon cancer Maternal Aunt    Colon cancer Maternal Aunt    Stomach cancer Neg Hx    Esophageal cancer Neg Hx    Colon polyps Neg Hx    Rectal cancer Neg Hx         Review of Systems  Constitutional:  Negative for fever, malaise/fatigue and weight loss.  HENT:  Negative  for congestion and sore throat.   Eyes:        Negative for visual changes  Respiratory:  Negative for cough and shortness of breath.   Cardiovascular:  Negative for chest pain, palpitations and leg swelling.  Gastrointestinal:  Negative for blood in stool, constipation, diarrhea and heartburn.  Genitourinary:  Negative for dysuria, frequency and urgency.  Musculoskeletal:  Positive for back pain. Negative for falls, joint pain and myalgias.  Skin:  Negative for rash.  Neurological:  Negative for dizziness, sensory change, focal weakness, weakness and headaches.  Endo/Heme/Allergies:  Does not bruise/bleed easily.  Psychiatric/Behavioral:  Negative for depression, substance abuse and suicidal ideas. The patient is not nervous/anxious.    Objective:   Vitals:   12/09/20 1137  BP: 114/78  Pulse: 72  Temp: (!) 96.9 F (36.1 C)  SpO2: 99%    Body mass index is 26.34 kg/m.   Physical Examination:  Physical Exam Vitals reviewed. Exam conducted with a chaperone present.  Constitutional:      General: She is not in acute distress.    Appearance: She is well-developed. She is obese.  HENT:     Right Ear: Tympanic membrane, ear canal and external ear normal.     Left Ear: Tympanic membrane, ear canal and external ear normal.  Eyes:     Extraocular Movements: Extraocular movements intact.     Conjunctiva/sclera: Conjunctivae normal.  Cardiovascular:     Rate and Rhythm: Normal rate and regular rhythm.     Heart sounds: Normal heart sounds.  Pulmonary:     Effort: Pulmonary effort is normal. No respiratory distress.     Breath sounds: Normal breath sounds.  Chest:     Chest wall: No tenderness.  Breasts:    Breasts are symmetrical.     Right: Normal.     Left: Normal.  Abdominal:     General: Bowel sounds are normal.     Palpations: Abdomen is soft.  Musculoskeletal:        General: Normal range of motion.     Cervical back: Normal range of motion and neck supple.      Right lower leg: No edema.     Left lower leg: No edema.  Lymphadenopathy:     Upper Body:     Right upper body: No supraclavicular, axillary or pectoral adenopathy.     Left upper body: No supraclavicular or axillary adenopathy.  Skin:    General: Skin is warm and dry.  Neurological:     Mental Status: She is alert and oriented to person, place, and time.     Deep Tendon Reflexes: Reflexes are normal and symmetric.  Psychiatric:        Mood and Affect: Mood normal.        Behavior: Behavior normal.        Thought Content: Thought content normal.   ASSESSMENT and PLAN: This visit occurred during the SARS-CoV-2 public health emergency.  Safety protocols were in place, including screening questions prior to the visit, additional usage of staff PPE, and extensive cleaning of exam room while observing appropriate contact time as indicated for disinfecting solutions.   Kam[place]e was seen today for annual exam.  Diagnoses and all orders for this visit:  Encounter for preventative adult health care exam with abnormal findings -     Comprehensive metabolic panel -     CBC with Differential/Platelet  Hypercholesterolemia -     Lipid panel  Vitamin D deficiency -     Vitamin D (25 hydroxy) -     Vitamin D, Ergocalciferol, (DRISDOL) 1.25 MG (50000 UNIT) CAPS capsule; Take 1 capsule (50,000 Units total) by mouth every 7 (seven) days.  Hyperthyroidism -     TSH -     T4, free  Left lumbar radiculopathy -     cyclobenzaprine (FLEXERIL) 5 MG tablet; Take 1 tablet (5 mg total) by mouth at bedtime as needed for muscle spasms.  Leukocytosis, unspecified type -     CBC with Differential/Platelet; Future    Maintain heart healthy diet and daily exercise. Bring copy of completed living will and HCPOA.  Problem List Items Addressed This Visit       Endocrine   Hyperthyroidism    Methimazole discontinued  1year ago, Repeat TSh and T4: normal Repeat in 39months      Relevant Orders    TSH (Completed)   T4, free (Completed)     Nervous and Auditory   Left lumbar radiculopathy    Chronic, waxing and waning, worse in sitting and laying position. Improves with repositioning No pain with walking. Reviewed previous MRI and DG lumbar spine, hip DG: no acute finding. Advised to resume home back and leg exercise as directed by sports medicine. Refer for outot PT if no improvement in 4-6weeks       Relevant Medications   cyclobenzaprine (FLEXERIL) 5 MG tablet     Other   Hypercholesterolemia    Repeat lipid panel: continue heart health diet and daily exercise. Lipid Panel     Component Value Date/Time   CHOL 136 12/09/2020 1234   CHOL 131 08/29/2018 1202   TRIG 109.0 12/09/2020 1234   HDL 39.90 12/09/2020 1234   HDL 36 (L) 08/29/2018 1202   CHOLHDL 3 12/09/2020 1234   VLDL 21.8 12/09/2020 1234   LDLCALC 75 12/09/2020 1234   LDLCALC 87 08/29/2018 1202   LABVLDL 8 08/29/2018 1202        Relevant Orders   Lipid panel (Completed)   Vitamin D deficiency    Repeat vit. D: low 50000IU weekly x 8weeks sent      Relevant Medications   Vitamin D, Ergocalciferol, (DRISDOL) 1.25 MG (50000 UNIT) CAPS capsule   Other Relevant Orders   Vitamin D (25 hydroxy) (Completed)   Other Visit Diagnoses     Encounter for preventative adult health care exam with abnormal findings    -  Primary   Relevant Orders   Comprehensive metabolic panel (Completed)   CBC with Differential/Platelet (Completed)   Leukocytosis, unspecified type       Relevant Orders   CBC with Differential/Platelet       Follow up: Return in about 1 year (around 12/09/2021) for CPE (fasting).  Wilfred Lacy, NP

## 2020-12-09 NOTE — Patient Instructions (Signed)
Go to lab for blood draw.  Start back exercise. Call office if not improvement in 4-6weeks.  Preventive Care 87-51 Years Old, Female Preventive care refers to lifestyle choices and visits with your health care provider that can promote health and wellness. Preventive care visits are also called wellness exams. What can I expect for my preventive care visit? Counseling Your health care provider may ask you questions about your: Medical history, including: Past medical problems. Family medical history. Pregnancy history. Current health, including: Menstrual cycle. Method of birth control. Emotional well-being. Home life and relationship well-being. Sexual activity and sexual health. Lifestyle, including: Alcohol, nicotine or tobacco, and drug use. Access to firearms. Diet, exercise, and sleep habits. Work and work Statistician. Sunscreen use. Safety issues such as seatbelt and bike helmet use. Physical exam Your health care provider will check your: Height and weight. These may be used to calculate your BMI (body mass index). BMI is a measurement that tells if you are at a healthy weight. Waist circumference. This measures the distance around your waistline. This measurement also tells if you are at a healthy weight and may help predict your risk of certain diseases, such as type 2 diabetes and high blood pressure. Heart rate and blood pressure. Body temperature. Skin for abnormal spots. What immunizations do I need? Vaccines are usually given at various ages, according to a schedule. Your health care provider will recommend vaccines for you based on your age, medical history, and lifestyle or other factors, such as travel or where you work. What tests do I need? Screening Your health care provider may recommend screening tests for certain conditions. This may include: Lipid and cholesterol levels. Diabetes screening. This is done by checking your blood sugar (glucose) after you  have not eaten for a while (fasting). Pelvic exam and Pap test. Hepatitis B test. Hepatitis C test. HIV (human immunodeficiency virus) test. STI (sexually transmitted infection) testing, if you are at risk. Lung cancer screening. Colorectal cancer screening. Mammogram. Talk with your health care provider about when you should start having regular mammograms. This may depend on whether you have a family history of breast cancer. BRCA-related cancer screening. This may be done if you have a family history of breast, ovarian, tubal, or peritoneal cancers. Bone density scan. This is done to screen for osteoporosis. Talk with your health care provider about your test results, treatment options, and if necessary, the need for more tests. Follow these instructions at home: Eating and drinking  Eat a diet that includes fresh fruits and vegetables, whole grains, lean protein, and low-fat dairy products. Take vitamin and mineral supplements as recommended by your health care provider. Do not drink alcohol if: Your health care provider tells you not to drink. You are pregnant, may be pregnant, or are planning to become pregnant. If you drink alcohol: Limit how much you have to 0-1 drink a day. Know how much alcohol is in your drink. In the [place] one drink equals one 12 oz bottle of beer (355 mL), one 5 oz glass of wine (148 mL), or one 1 oz glass of hard liquor (44 mL). Lifestyle Brush your teeth every morning and night with fluoride toothpaste. Floss one time each day. Exercise for at least 30 minutes 5 or more days each week. Do not use any products that contain nicotine or tobacco. These products include cigarettes, chewing tobacco, and vaping devices, such as e-cigarettes. If you need help quitting, ask your health care provider. Do not use drugs.  If you are sexually active, practice safe sex. Use a condom or other form of protection to prevent STIs. If you do not wish to become pregnant, use  a form of birth control. If you plan to become pregnant, see your health care provider for a prepregnancy visit. Take aspirin only as told by your health care provider. Make sure that you understand how much to take and what form to take. Work with your health care provider to find out whether it is safe and beneficial for you to take aspirin daily. Find healthy ways to manage stress, such as: Meditation, yoga, or listening to music. Journaling. Talking to a trusted person. Spending time with friends and family. Minimize exposure to UV radiation to reduce your risk of skin cancer. Safety Always wear your seat belt while driving or riding in a vehicle. Do not drive: If you have been drinking alcohol. Do not ride with someone who has been drinking. When you are tired or distracted. While texting. If you have been using any mind-altering substances or drugs. Wear a helmet and other protective equipment during sports activities. If you have firearms in your house, make sure you follow all gun safety procedures. Seek help if you have been physically or sexually abused. What's next? Visit your health care provider once a year for an annual wellness visit. Ask your health care provider how often you should have your eyes and teeth checked. Stay up to date on all vaccines. This information is not intended to replace advice given to you by your health care provider. Make sure you discuss any questions you have with your health care provider. Document Revised: 07/09/2020 Document Reviewed: 07/09/2020 Elsevier Patient Education  [place].

## 2020-12-10 ENCOUNTER — Encounter: Payer: Self-pay | Admitting: Nurse Practitioner

## 2020-12-10 MED ORDER — VITAMIN D (ERGOCALCIFEROL) 1.25 MG (50000 UNIT) PO CAPS: 50000.0000 [IU] | ORAL_CAPSULE | ORAL | 0 refills | Status: DC

## 2020-12-24 ENCOUNTER — Other Ambulatory Visit: Payer: Self-pay

## 2020-12-24 ENCOUNTER — Other Ambulatory Visit (INDEPENDENT_AMBULATORY_CARE_PROVIDER_SITE_OTHER): Payer: Federal, State, Local not specified - PPO

## 2020-12-24 DIAGNOSIS — D72829 Elevated white blood cell count, unspecified: Secondary | ICD-10-CM | POA: Diagnosis not present

## 2020-12-24 LAB — CBC WITH DIFFERENTIAL/PLATELET
Basophils Absolute: 0 10*3/uL (ref 0.0–0.1)
Basophils Relative: 0.7 % (ref 0.0–3.0)
Eosinophils Absolute: 0 10*3/uL (ref 0.0–0.7)
Eosinophils Relative: 0.9 % (ref 0.0–5.0)
HCT: 41.8 % (ref 36.0–46.0)
Hemoglobin: 13.7 g/dL (ref 12.0–15.0)
Lymphocytes Relative: 28.7 % (ref 12.0–46.0)
Lymphs Abs: 1.4 10*3/uL (ref 0.7–4.0)
MCHC: 32.8 g/dL (ref 30.0–36.0)
MCV: 86.4 fl (ref 78.0–100.0)
Monocytes Absolute: 0.6 10*3/uL (ref 0.1–1.0)
Monocytes Relative: 12.4 % — ABNORMAL HIGH (ref 3.0–12.0)
Neutro Abs: 2.7 10*3/uL (ref 1.4–7.7)
Neutrophils Relative %: 57.3 % (ref 43.0–77.0)
Platelets: 196 10*3/uL (ref 150.0–400.0)
RBC: 4.83 Mil/uL (ref 3.87–5.11)
RDW: 13.3 % (ref 11.5–15.5)
WBC: 4.8 10*3/uL (ref 4.0–10.5)

## 2020-12-25 ENCOUNTER — Encounter: Payer: Self-pay | Admitting: Nurse Practitioner

## 2020-12-25 ENCOUNTER — Ambulatory Visit: Payer: Federal, State, Local not specified - PPO | Admitting: Nurse Practitioner

## 2020-12-25 VITALS — BP 100/80 | HR 80 | Temp 97.2°F | Wt 165.2 lb

## 2020-12-25 DIAGNOSIS — R42 Dizziness and giddiness: Secondary | ICD-10-CM

## 2020-12-25 NOTE — Progress Notes (Signed)
Subjective:  Patient ID: Christina [name], female    DOB: 1970-09-05  Age: 50 y.o. MRN: [id number]  CC: Acute Visit (Pt c/o dizziness that comes and goes x 2-3 weeks. Pt went to urgent care before Thanksgiving and was informed she had sinusitis and was given medication which did help but once medication was completed symptoms came back. /Pt states she does have a hx of vertigo.)  Dizziness This is a recurrent problem. The current episode started more than 1 month ago. The problem occurs intermittently. The problem has been waxing and waning. Associated symptoms include nausea and vertigo. Pertinent negatives include no abdominal pain, anorexia, arthralgias, change in bowel habit, chest pain, chills, congestion, coughing, diaphoresis, fatigue, fever, headaches, joint swelling, myalgias, neck pain, numbness, rash, sore throat, swollen glands, urinary symptoms, visual change, vomiting or weakness. The symptoms are aggravated by walking, twisting and bending. She has tried rest (medrol dose pack and meclizine) for the symptoms. The treatment provided moderate relief.  Evaluated by urgent care clinic last week. Reports hx of vertigo. Denies any head injury Drinks 1-2 16oz bottle of water per day, 1bottle of tea and 1bottle of Dr. Malachi [name]  BP Readings from Last 3 Encounters:  12/25/20 100/80  12/09/20 114/78  10/20/20 118/78    Reviewed past Medical, Social and Family history today.  Outpatient Medications Prior to Visit  Medication Sig Dispense Refill   cyclobenzaprine (FLEXERIL) 5 MG tablet Take 1 tablet (5 mg total) by mouth at bedtime as needed for muscle spasms. 14 tablet 0   Vitamin D, Ergocalciferol, (DRISDOL) 1.25 MG (50000 UNIT) CAPS capsule Take 1 capsule (50,000 Units total) by mouth every 7 (seven) days. 8 capsule 0   Azelastine HCl 137 MCG/SPRAY SOLN Place into both nostrils. (Patient not taking: Reported on 12/25/2020)     [place]  (Patient not taking: Reported on  12/25/2020)     No facility-administered medications prior to visit.    ROS See HPI  Objective:  BP 100/80 (BP Location: Left Arm, Patient Position: Sitting, Cuff Size: Large)   Pulse 80   Temp (!) 97.2 F (36.2 C) (Temporal)   Wt 165 lb 3.2 oz (74.9 kg)   SpO2 99%   BMI 25.87 kg/m   Physical Exam Constitutional:      General: She is not in acute distress. HENT:     Right Ear: Tympanic membrane, ear canal and external ear normal.     Left Ear: Tympanic membrane, ear canal and external ear normal.  Eyes:     Extraocular Movements: Extraocular movements intact.     Right eye: Normal extraocular motion and no nystagmus.     Left eye: Normal extraocular motion and no nystagmus.     Conjunctiva/sclera: Conjunctivae normal.     Pupils: Pupils are equal, round, and reactive to light.  Cardiovascular:     Rate and Rhythm: Normal rate.     Pulses: Normal pulses.  Pulmonary:     Effort: Pulmonary effort is normal.  Musculoskeletal:     Cervical back: Normal range of motion and neck supple.  Lymphadenopathy:     Cervical: No cervical adenopathy.  Neurological:     Mental Status: She is alert and oriented to person, place, and time.     Cranial Nerves: No cranial nerve deficit.     Comments: Reports dizziness with epley maneuver (bilateral) but no nystagmus noted  Psychiatric:        Mood and Affect: Mood normal.  Behavior: Behavior normal.        Thought Content: Thought content normal.    Assessment & Plan:  This visit occurred during the SARS-CoV-2 public health emergency.  Safety protocols were in place, including screening questions prior to the visit, additional usage of staff PPE, and extensive cleaning of exam room while observing appropriate contact time as indicated for disinfecting solutions.   Christina [name] was seen today for acute visit.  Diagnoses and all orders for this visit:  Vertigo Advised to Increase oral hydration (goal is at least 2liters per  day) Change position slowly Start vertigo exercises, once daily. Printed sheet provided Use meclizine as prescribed.  Problem List Items Addressed This Visit       Other   Vertigo - Primary    Follow-up: No follow-ups on file.  Wilfred Lacy, NP

## 2020-12-25 NOTE — Patient Instructions (Signed)
Repeat cbc was normal.  Increase oral hydration (goal is at least 2liters per day) Change position slowly Start vertigo exercises, once daily. Use meclizine as prescribed.  Vertigo Vertigo is the feeling that you or the things around you are moving when they are not. This feeling can come and go at any time. Vertigo often goes away on its own. This condition can be dangerous if it happens when you are doing activities like driving or working with machines. Your doctor will do tests to find the cause of your vertigo. These tests will also help your doctor decide on the best treatment for you. Follow these instructions at home: Eating and drinking   Drink enough fluid to keep your pee (urine) pale yellow. Do not drink alcohol. Activity Return to your normal activities when your doctor says that it is safe. In the morning, first sit up on the side of the bed. When you feel okay, stand slowly while you hold onto something until you know that your balance is fine. Move slowly. Avoid sudden body or head movements or certain positions, as told by your doctor. Use a cane if you have trouble standing or walking. Sit down right away if you feel dizzy. Avoid doing any tasks or activities that can cause danger to you or others if you get dizzy. Avoid bending down if you feel dizzy. Place items in your home so that they are easy for you to reach without bending or leaning over. Do not drive or use machinery if you feel dizzy. General instructions Take over-the-counter and prescription medicines only as told by your doctor. Keep all follow-up visits. Contact a doctor if: Your medicine does not help your vertigo. Your problems get worse or you have new symptoms. You have a fever. You feel like you may vomit (nauseous), or this feeling gets worse. You start to vomit. Your family or friends see changes in how you act. You lose feeling (have numbness) in part of your body. You feel prickling and  tingling in a part of your body. Get help right away if: You are always dizzy. You faint. You get very bad headaches. You get a stiff neck. Bright light starts to bother you. You have trouble moving or talking. You feel weak in your hands, arms, or legs. You have changes in your hearing or in how you see (vision). These symptoms may be an emergency. Get help right away. Call your local emergency services (911 in the [place] Do not wait to see if the symptoms will go away. Do not drive yourself to the hospital. Summary Vertigo is the feeling that you or the things around you are moving when they are not. Your doctor will do tests to find the cause of your vertigo. You may be told to avoid some tasks, positions, or movements. Contact a doctor if your medicine is not helping, or if you have a fever, new symptoms, or a change in how you act. Get help right away if you get very bad headaches, or if you have changes in how you speak, hear, or see. This information is not intended to replace advice given to you by your health care provider. Make sure you discuss any questions you have with your health care provider. Document Revised: 12/12/2019 Document Reviewed: 12/12/2019 Elsevier Patient Education  2022 [employer].

## 2020-12-26 NOTE — Progress Notes (Signed)
Christina [name] [address] [place] Phone: [phone] Subjective:   Christina [name], am serving as a scribe for Dr. Hulan Saas. This visit occurred during the SARS-CoV-2 public health emergency.  Safety protocols were in place, including screening questions prior to the visit, additional usage of staff PPE, and extensive cleaning of exam room while observing appropriate contact time as indicated for disinfecting solutions.   I'm seeing this patient by the request  of:  [name], Charlene Brooke, NP  CC: Neck and back pain follow-up  [id number]  Christina [name] is a 50 y.o. female coming in with complaint of back and neck pain. OMT on 10/20/2020. Patient states feels better since the last visit. No new complaints.  Patient recently has been out of work for the last couple days secondary to vertigo.  Patient states the vertigo is much better and she was returning to work tomorrow.  Medications patient has been prescribed: None  Taking:         Reviewed prior external information including notes and imaging from previsou exam, outside providers and external EMR if available.   As well as notes that were available from care everywhere and other healthcare systems.  Past medical history, social, surgical and family history all reviewed in electronic medical record.  No pertanent information unless stated regarding to the chief complaint.   Past Medical History:  Diagnosis Date   GERD (gastroesophageal reflux disease) 2018   Hypercholesteremia    Hyperthyroidism    Migraines    with asthma   Slipping rib syndrome     Allergies  Allergen Reactions   Topamax [Topiramate]     Pain in legs, SOB, and almost passed out     Review of Systems:  No headache, visual changes, nausea, vomiting, diarrhea, constipation,  abdominal pain, skin rash, fevers, chills, night sweats, weight loss, swollen lymph nodes, body aches, joint swelling,  chest pain, shortness of breath, mood changes. POSITIVE muscle aches, dizziness and vertigo  Objective  Blood pressure 110/70, pulse 88, height 5\' 7"  (1.702 m), weight 166 lb (75.3 kg), SpO2 98 %.   General: No apparent distress alert and oriented x3 mood and affect normal, dressed appropriately.  HEENT: Pupils equal, extraocular movements intact  Respiratory: Patient's speak in full sentences and does not appear short of breath  Cardiovascular: No lower extremity edema, non tender, no erythema  Patient does have tightness noted in the right parascapular region.  Patient does have some mild scapular dyskinesis still noted.  Patient does have some tenderness to palpation more over the sacroiliac joint right greater than left as well.  Osteopathic findings  C2 flexed rotated and side bent right C6 flexed rotated and side bent left T3 extended rotated and side bent right inhaled rib T9 extended rotated and side bent left L2 flexed rotated and side bent right Sacrum left on left       Assessment and Plan:  Low back pain Chronic problem with mild exacerbation noted..  Patient does have some cyclobenzaprine.  Overall the would state the patient is doing relatively well and is stable.  Believe the patient has not been quite as active secondary to recent bout with vertigo.  Increase activity slowly.  Follow-up again in 6 to 8 weeks   Nonallopathic problems  Decision today to treat with OMT was based on Physical Exam  After verbal consent patient was treated with HVLA, ME, FPR techniques in cervical, rib, thoracic, lumbar, and  sacral  areas  Patient tolerated the procedure well with improvement in symptoms  Patient given exercises, stretches and lifestyle modifications  See medications in patient instructions if given  Patient will follow up in 4-8 weeks      The above documentation has been reviewed and is accurate and complete Christina Pulley, DO        Note: This  dictation was prepared with Dragon dictation along with smaller phrase technology. Any transcriptional errors that result from this process are unintentional.

## 2020-12-29 ENCOUNTER — Ambulatory Visit: Payer: Federal, State, Local not specified - PPO | Admitting: Family Medicine

## 2020-12-29 ENCOUNTER — Other Ambulatory Visit: Payer: Self-pay

## 2020-12-29 VITALS — BP 110/70 | HR 88 | Ht 67.0 in | Wt 166.0 lb

## 2020-12-29 DIAGNOSIS — M9908 Segmental and somatic dysfunction of rib cage: Secondary | ICD-10-CM | POA: Diagnosis not present

## 2020-12-29 DIAGNOSIS — M9904 Segmental and somatic dysfunction of sacral region: Secondary | ICD-10-CM | POA: Diagnosis not present

## 2020-12-29 DIAGNOSIS — G8929 Other chronic pain: Secondary | ICD-10-CM

## 2020-12-29 DIAGNOSIS — M940 Chondrocostal junction syndrome [Tietze]: Secondary | ICD-10-CM | POA: Diagnosis not present

## 2020-12-29 DIAGNOSIS — M9901 Segmental and somatic dysfunction of cervical region: Secondary | ICD-10-CM | POA: Diagnosis not present

## 2020-12-29 DIAGNOSIS — M9903 Segmental and somatic dysfunction of lumbar region: Secondary | ICD-10-CM

## 2020-12-29 DIAGNOSIS — M5442 Lumbago with sciatica, left side: Secondary | ICD-10-CM | POA: Diagnosis not present

## 2020-12-29 DIAGNOSIS — M9902 Segmental and somatic dysfunction of thoracic region: Secondary | ICD-10-CM

## 2020-12-29 NOTE — Patient Instructions (Signed)
Good to see you as always! Hope the vertigo stays away See you again in 6-8 weeks

## 2020-12-30 ENCOUNTER — Encounter: Payer: Self-pay | Admitting: Family Medicine

## 2020-12-30 NOTE — Assessment & Plan Note (Signed)
>>  ASSESSMENT AND PLAN FOR LOW BACK PAIN WRITTEN ON 12/30/2020  6:58 AM BY Antoine Primas M, DO  Chronic problem with mild exacerbation noted..  Patient does have some cyclobenzaprine.  Overall the would state the patient is doing relatively well and is stable.  Believe the patient has not been quite as active secondary to recent bout with vertigo.  Increase activity slowly.  Follow-up again in 6 to 8 weeks

## 2020-12-30 NOTE — Assessment & Plan Note (Signed)
Chronic problem with mild exacerbation noted..  Patient does have some cyclobenzaprine.  Overall the would state the patient is doing relatively well and is stable.  Believe the patient has not been quite as active secondary to recent bout with vertigo.  Increase activity slowly.  Follow-up again in 6 to 8 weeks

## 2021-02-06 NOTE — Progress Notes (Signed)
[place] Parekh [place] [address] [place] Phone: [phone] Subjective:   IVilma [name], am serving as a scribe for Dr. Hulan Saas. This visit occurred during the SARS-CoV-2 public health emergency.  Safety protocols were in place, including screening questions prior to the visit, additional usage of staff PPE, and extensive cleaning of exam room while observing appropriate contact time as indicated for disinfecting solutions.   I'm seeing this patient by the request  of:  [name], Charlene Brooke, NP  CC: Back and neck pain follow-up  [id number]  Christina [name] is a 51 y.o. female coming in with complaint of back and neck pain. OMT on 12/29/2020. Patient states same as usual. No new complaints  Medications patient has been prescribed: None  Taking:         Reviewed prior external information including notes and imaging from previsou exam, outside providers and external EMR if available.   As well as notes that were available from care everywhere and other healthcare systems.  Past medical history, social, surgical and family history all reviewed in electronic medical record.  No pertanent information unless stated regarding to the chief complaint.   Past Medical History:  Diagnosis Date   GERD (gastroesophageal reflux disease) 2018   Hypercholesteremia    Hyperthyroidism    Migraines    with asthma   Slipping rib syndrome     Allergies  Allergen Reactions   Topamax [Topiramate]     Pain in legs, SOB, and almost passed out     Review of Systems:  No headache, visual changes, nausea, vomiting, diarrhea, constipation, dizziness, abdominal pain, skin rash, fevers, chills, night sweats, weight loss, swollen lymph nodes, body aches, joint swelling, chest pain, shortness of breath, mood changes. POSITIVE muscle aches  Objective  Blood pressure 118/88, pulse 88, height 5\' 7"  (1.702 m), weight 166 lb (75.3 kg), SpO2 99 %.    General: No apparent distress alert and oriented x3 mood and affect normal, dressed appropriately.  HEENT: Pupils equal, extraocular movements intact  Respiratory: Patient's speak in full sentences and does not appear short of breath  Cardiovascular: No lower extremity edema, non tender, no erythema  Exam shows some mild loss of lordosis.  Some tenderness to palpation in the paraspinal musculature  D mild increase tenderness around the sacroiliac joint right greater than left.  Osteopathic findings  C2 flexed rotated and side bent right C7 flexed rotated and side bent left T3 extended rotated and side bent right inhaled rib T6 extended rotated and side bent left L4 flexed rotated and side bent right Sacrum right on right       Assessment and Plan:  Low back pain Chronic problem with exacerbation.  Does have the Flexeril for breakthrough pain.  Discussed which activities to do which wants to avoid.  Patient did do a lot of cleaning.  Do feel that some tenderness in the hip flexion and tightness is noted more pain.  Follow-up again in 6 to 8 weeks   Nonallopathic problems  Decision today to treat with OMT was based on Physical Exam  After verbal consent patient was treated with HVLA, ME, FPR techniques in cervical, rib, thoracic, lumbar, and sacral  areas  Patient tolerated the procedure well with improvement in symptoms  Patient given exercises, stretches and lifestyle modifications  See medications in patient instructions if given  Patient will follow up in 4-8 weeks     The above documentation has been reviewed  and is accurate and complete Christina Pulley, DO        Note: This dictation was prepared with Dragon dictation along with smaller phrase technology. Any transcriptional errors that result from this process are unintentional.

## 2021-02-09 ENCOUNTER — Encounter: Payer: Self-pay | Admitting: Family Medicine

## 2021-02-09 ENCOUNTER — Other Ambulatory Visit: Payer: Self-pay

## 2021-02-09 ENCOUNTER — Ambulatory Visit: Payer: Federal, State, Local not specified - PPO | Admitting: Family Medicine

## 2021-02-09 VITALS — BP 118/88 | HR 88 | Ht 67.0 in | Wt 166.0 lb

## 2021-02-09 DIAGNOSIS — M9904 Segmental and somatic dysfunction of sacral region: Secondary | ICD-10-CM

## 2021-02-09 DIAGNOSIS — M5416 Radiculopathy, lumbar region: Secondary | ICD-10-CM | POA: Diagnosis not present

## 2021-02-09 DIAGNOSIS — M9903 Segmental and somatic dysfunction of lumbar region: Secondary | ICD-10-CM

## 2021-02-09 DIAGNOSIS — M9901 Segmental and somatic dysfunction of cervical region: Secondary | ICD-10-CM | POA: Diagnosis not present

## 2021-02-09 DIAGNOSIS — M9902 Segmental and somatic dysfunction of thoracic region: Secondary | ICD-10-CM | POA: Diagnosis not present

## 2021-02-09 DIAGNOSIS — M9908 Segmental and somatic dysfunction of rib cage: Secondary | ICD-10-CM | POA: Diagnosis not present

## 2021-02-09 NOTE — Patient Instructions (Signed)
Christina [name] is over ratted Try to control whatever excitement tonight See you again in 5 weeks

## 2021-02-09 NOTE — Assessment & Plan Note (Signed)
Chronic problem with exacerbation.  Does have the Flexeril for breakthrough pain.  Discussed which activities to do which wants to avoid.  Patient did do a lot of cleaning.  Do feel that some tenderness in the hip flexion and tightness is noted more pain.  Follow-up again in 6 to 8 weeks

## 2021-02-09 NOTE — Assessment & Plan Note (Addendum)
>>  ASSESSMENT AND PLAN FOR LEFT LUMBAR RADICULOPATHY WRITTEN ON 02/09/2021  4:29 PM BY Antoine Primas M, DO  Mild exacerbation of a chronic problem.  Does have the muscle relaxer needed for any breakthrough.  Discussed icing regimen and home exercises.  Discussed which activities that he mentions to avoid.  Increase activity slowly.  Follow-up again in 6 weeks  >>ASSESSMENT AND PLAN FOR LOW BACK PAIN WRITTEN ON 02/09/2021  2:19 PM BY Antoine Primas M, DO  Chronic problem with exacerbation.  Does have the Flexeril for breakthrough pain.  Discussed which activities to do which wants to avoid.  Patient did do a lot of cleaning.  Do feel that some tenderness in the hip flexion and tightness is noted more pain.  Follow-up again in 6 to 8 weeks

## 2021-03-16 DIAGNOSIS — R6884 Jaw pain: Secondary | ICD-10-CM | POA: Diagnosis not present

## 2021-03-18 NOTE — Progress Notes (Signed)
[place] Phone: [phone] Subjective:   [name], am serving as a scribe for Dr. Hulan Saas.  This visit occurred during the SARS-CoV-2 public health emergency.  Safety protocols were in place, including screening questions prior to the visit, additional usage of staff PPE, and extensive cleaning of exam room while observing appropriate contact time as indicated for disinfecting solutions.  I'm seeing this patient by the request  of:  [name], Charlene Brooke, NP  CC: Neck and back pain follow-up  [id number]  Assyria D. Amor is a 51 y.o. female coming in with complaint of back and neck pain. OMT 02/09/2021. Patient states that on Sunday she had increase in pain in R lumbar spine. Pain radiated into R glute down to the R foot. Advil for pain. Pain has improved but still there in R glute. No injury to back.   Medications patient has been prescribed: None  Taking:         Reviewed prior external information including notes and imaging from previsou exam, outside providers and external EMR if available.   As well as notes that were available from care everywhere and other healthcare systems.  Past medical history, social, surgical and family history all reviewed in electronic medical record.  No pertanent information unless stated regarding to the chief complaint.   Past Medical History:  Diagnosis Date   GERD (gastroesophageal reflux disease) 2018   Hypercholesteremia    Hyperthyroidism    Migraines    with asthma   Slipping rib syndrome     Allergies  Allergen Reactions   Topamax [Topiramate]     Pain in legs, SOB, and almost passed out     Review of Systems:  No headache, visual changes, nausea, vomiting, diarrhea, constipation, dizziness, abdominal pain, skin rash, fevers, chills, night sweats, weight loss, swollen lymph nodes, body aches, joint swelling, chest pain, shortness of  breath, mood changes. POSITIVE muscle aches  Objective  Blood pressure 100/62, pulse 80, height 5\' 7"  (1.702 m), weight 166 lb (75.3 kg), SpO2 97 %.   General: No apparent distress alert and oriented x3 mood and affect normal, dressed appropriately.  HEENT: Pupils equal, extraocular movements intact  Respiratory: Patient's speak in full sentences and does not appear short of breath  Cardiovascular: No lower extremity edema, non tender, no erythema  Right gluteal still has some tightness noted.  Severe tenderness to palpation in the right sacroiliac joint.  The patient does have tightness in the parascapular region as well.  With mild tenderness on the right side of the parascapular area going all the way to the cervical spine.  Lacks last 5 degrees of extension.  Negative straight leg test but patient is having tightness of the hamstring more than patient's baseline.  Osteopathic findings  C2 flexed rotated and side bent right T5 extended rotated and side bent right inhaled rib L1 flexed rotated and side bent right L5 flexed rotated and side bent left Sacrum right on right       Assessment and Plan:   SI (sacroiliac) joint dysfunction Chronic pain with exacerbation.  Patient does have the muscle relaxer and the Flexeril and discussed using that on a more regular basis again.  Discussed icing regimen and home exercises, discussed which activities to do and which ones to avoid, increase activity slowly, discussed icing regimen.  Follow-up again in 6 to 8 weeks.    Nonallopathic problems  Decision today to  treat with OMT was based on Physical Exam  After verbal consent patient was treated with HVLA, ME, FPR techniques in cervical, rib, thoracic, lumbar, and sacral  areas  Patient tolerated the procedure well with improvement in symptoms  Patient given exercises, stretches and lifestyle modifications  See medications in patient instructions if given  Patient will follow up in  4-8 weeks      The above documentation has been reviewed and is accurate and complete Lyndal Pulley, DO       Note: This dictation was prepared with Dragon dictation along with smaller phrase technology. Any transcriptional errors that result from this process are unintentional.

## 2021-03-19 ENCOUNTER — Encounter: Payer: Self-pay | Admitting: Family Medicine

## 2021-03-19 ENCOUNTER — Ambulatory Visit: Payer: Federal, State, Local not specified - PPO | Admitting: Family Medicine

## 2021-03-19 ENCOUNTER — Other Ambulatory Visit: Payer: Self-pay

## 2021-03-19 VITALS — BP 100/62 | HR 80 | Ht 67.0 in | Wt 166.0 lb

## 2021-03-19 DIAGNOSIS — M9902 Segmental and somatic dysfunction of thoracic region: Secondary | ICD-10-CM | POA: Diagnosis not present

## 2021-03-19 DIAGNOSIS — M9903 Segmental and somatic dysfunction of lumbar region: Secondary | ICD-10-CM

## 2021-03-19 DIAGNOSIS — M9904 Segmental and somatic dysfunction of sacral region: Secondary | ICD-10-CM | POA: Diagnosis not present

## 2021-03-19 DIAGNOSIS — M9908 Segmental and somatic dysfunction of rib cage: Secondary | ICD-10-CM | POA: Diagnosis not present

## 2021-03-19 DIAGNOSIS — M9901 Segmental and somatic dysfunction of cervical region: Secondary | ICD-10-CM | POA: Diagnosis not present

## 2021-03-19 DIAGNOSIS — M533 Sacrococcygeal disorders, not elsewhere classified: Secondary | ICD-10-CM | POA: Diagnosis not present

## 2021-03-19 NOTE — Patient Instructions (Signed)
Good to see you See me again in 4-6 weeks 

## 2021-03-19 NOTE — Assessment & Plan Note (Signed)
Chronic pain with exacerbation.  Patient does have the muscle relaxer and the Flexeril and discussed using that on a more regular basis again.  Discussed icing regimen and home exercises, discussed which activities to do and which ones to avoid, increase activity slowly, discussed icing regimen.  Follow-up again in 6 to 8 weeks.

## 2021-03-20 DIAGNOSIS — R6884 Jaw pain: Secondary | ICD-10-CM | POA: Diagnosis not present

## 2021-03-23 DIAGNOSIS — R6884 Jaw pain: Secondary | ICD-10-CM | POA: Diagnosis not present

## 2021-03-25 DIAGNOSIS — R6884 Jaw pain: Secondary | ICD-10-CM | POA: Diagnosis not present

## 2021-03-30 DIAGNOSIS — R6884 Jaw pain: Secondary | ICD-10-CM | POA: Diagnosis not present

## 2021-04-03 DIAGNOSIS — R6884 Jaw pain: Secondary | ICD-10-CM | POA: Diagnosis not present

## 2021-04-06 DIAGNOSIS — R6884 Jaw pain: Secondary | ICD-10-CM | POA: Diagnosis not present

## 2021-04-10 DIAGNOSIS — R6884 Jaw pain: Secondary | ICD-10-CM | POA: Diagnosis not present

## 2021-04-13 DIAGNOSIS — R6884 Jaw pain: Secondary | ICD-10-CM | POA: Diagnosis not present

## 2021-04-16 DIAGNOSIS — R6884 Jaw pain: Secondary | ICD-10-CM | POA: Diagnosis not present

## 2021-04-17 NOTE — Progress Notes (Signed)
?Charlann Boxer D.O. ?[facility] ?[place] ?Phone: [phone] ?Subjective:   ?I, Vilma Meckel, am serving as a Education administrator for Dr. Hulan Saas. ?This visit occurred during the SARS-CoV-2 public health emergency.  Safety protocols were in place, including screening questions prior to the visit, additional usage of staff PPE, and extensive cleaning of exam room while observing appropriate contact time as indicated for disinfecting solutions.  ? ?I'm seeing this patient by the request  of:  [name], Charlene Brooke, NP ? ?CC: back and neck pain  ? ?[id number]  ?Neda D. Alegria is a 51 y.o. female coming in with complaint of back and neck pain. OMT on 03/19/2021. Patient states same per usual. No new complaints.  Patient feels like physical therapy has been very beneficial.  Has noticed improvement in range of motion.  Feels like she is not as tired as she has been previously ? ?Medications patient has been prescribed: None ? ?Taking: ? ? ?  ? ? ? ? ?Reviewed prior external information including notes and imaging from previsou exam, outside providers and external EMR if available.  ? ?As well as notes that were available from care everywhere and other healthcare systems. ? ?Past medical history, social, surgical and family history all reviewed in electronic medical record.  No pertanent information unless stated regarding to the chief complaint.  ? ?Past Medical History:  ?Diagnosis Date  ? GERD (gastroesophageal reflux disease) 2018  ? Hypercholesteremia   ? Hyperthyroidism   ? Migraines   ? with asthma  ? Slipping rib syndrome   ?  ?Allergies  ?Allergen Reactions  ? Topamax [Topiramate]   ?  Pain in legs, SOB, and almost passed out  ? ? ? ?Review of Systems: ? No headache, visual changes, nausea, vomiting, diarrhea, constipation, dizziness, abdominal pain, skin rash, fevers, chills, night sweats, weight loss, swollen lymph nodes, body aches, joint swelling, chest pain, shortness  of breath, mood changes. POSITIVE muscle aches ? ?Objective  ?Blood pressure 128/76, pulse 82, height '5\' 7"'$  (1.702 m), weight 169 lb (76.7 kg), SpO2 98 %. ?  ?General: No apparent distress alert and oriented x3 mood and affect normal, dressed appropriately.  ?HEENT: Pupils equal, extraocular movements intact  ?Respiratory: Patient's speak in full sentences and does not appear short of breath  ?Cardiovascular: No lower extremity edema, non tender, no erythema  ?Neck exam does have improvement in range of motion.  Patient does have more strength noted of the scapula.  Patient does still have some tenderness in the paraspinal musculature of the right scapular area. ?Low back exam does have some tightness noted with FABER test.  Negative straight leg test. ? ?Osteopathic findings ? ?C2 flexed rotated and side bent right ?C6 flexed rotated and side bent left ?T3 extended rotated and side bent right inhaled rib ?T8 extended rotated and side bent left ?L1 flexed rotated and side bent right ?Sacrum right on right ? ? ? ? ?  ?Assessment and Plan: ? ?SI (sacroiliac) joint dysfunction ?Patient has made tremendous improvement at the moment with physical therapy.  Patient is having significant improvement in range of motion from previous exam.  Discussed with patient icing regimen and home exercises, discussed which activities to do and which ones to avoid, increase activity slowly.  Follow-up again in 6 to 12 weeks ?  ? ?Nonallopathic problems ? ?Decision today to treat with OMT was based on Physical Exam ? ?After verbal consent patient was treated with HVLA, ME,  FPR techniques in cervical, rib, thoracic, lumbar, and sacral  areas ? ?Patient tolerated the procedure well with improvement in symptoms ? ?Patient given exercises, stretches and lifestyle modifications ? ?See medications in patient instructions if given ? ?Patient will follow up in 6-12 weeks ? ?  ? ? ?The above documentation has been reviewed and is accurate and  complete Lyndal Pulley, DO  ? ? ?  ? ? Note: This dictation was prepared with Dragon dictation along with smaller phrase technology. Any transcriptional errors that result from this process are unintentional.    ?  ?  ? ?

## 2021-04-20 ENCOUNTER — Other Ambulatory Visit: Payer: Self-pay

## 2021-04-20 ENCOUNTER — Ambulatory Visit: Payer: Federal, State, Local not specified - PPO | Admitting: Family Medicine

## 2021-04-20 VITALS — BP 128/76 | HR 82 | Ht 67.0 in | Wt 169.0 lb

## 2021-04-20 DIAGNOSIS — R6884 Jaw pain: Secondary | ICD-10-CM | POA: Diagnosis not present

## 2021-04-20 DIAGNOSIS — M9908 Segmental and somatic dysfunction of rib cage: Secondary | ICD-10-CM

## 2021-04-20 DIAGNOSIS — M9901 Segmental and somatic dysfunction of cervical region: Secondary | ICD-10-CM

## 2021-04-20 DIAGNOSIS — M533 Sacrococcygeal disorders, not elsewhere classified: Secondary | ICD-10-CM | POA: Diagnosis not present

## 2021-04-20 DIAGNOSIS — M9904 Segmental and somatic dysfunction of sacral region: Secondary | ICD-10-CM

## 2021-04-20 DIAGNOSIS — M9903 Segmental and somatic dysfunction of lumbar region: Secondary | ICD-10-CM

## 2021-04-20 DIAGNOSIS — M9902 Segmental and somatic dysfunction of thoracic region: Secondary | ICD-10-CM

## 2021-04-20 NOTE — Assessment & Plan Note (Signed)
Patient has made tremendous improvement at the moment with physical therapy.  Patient is having significant improvement in range of motion from previous exam.  Discussed with patient icing regimen and home exercises, discussed which activities to do and which ones to avoid, increase activity slowly.  Follow-up again in 6 to 12 weeks ?

## 2021-04-20 NOTE — Patient Instructions (Signed)
Good to see you! ?You are doing amazing keep it up ?[place] ?See you again in 8-10 weeks ?

## 2021-04-30 DIAGNOSIS — R6884 Jaw pain: Secondary | ICD-10-CM | POA: Diagnosis not present

## 2021-06-26 NOTE — Progress Notes (Signed)
[place] Phone: [phone] Subjective:   [name], am serving as a scribe for Dr. Hulan Saas.   I'm seeing this patient by the request  of:  [name], Charlene Brooke, NP  CC: back and neck pain   [id number]  Camilah D. Bagot is a 51 y.o. female coming in with complaint of back and neck pain. OMT on 04/20/2021. Patient states that last Saturday she felt like something moved in L thoracic spine. Had sharp pain with flexion. Patient coughed on Sunday and pain got worse. Patient will feel throbbing pain.   Medications patient has been prescribed: None  Taking:         Reviewed prior external information including notes and imaging from previsou exam, outside providers and external EMR if available.   As well as notes that were available from care everywhere and other healthcare systems.  Past medical history, social, surgical and family history all reviewed in electronic medical record.  No pertanent information unless stated regarding to the chief complaint.   Past Medical History:  Diagnosis Date   GERD (gastroesophageal reflux disease) 2018   Hypercholesteremia    Hyperthyroidism    Migraines    with asthma   Slipping rib syndrome     Allergies  Allergen Reactions   Topamax [Topiramate]     Pain in legs, SOB, and almost passed out     Review of Systems:  No headache, visual changes, nausea, vomiting, diarrhea, constipation, dizziness, abdominal pain, skin rash, fevers, chills, night sweats, weight loss, swollen lymph nodes, body aches, joint swelling, chest pain, shortness of breath, mood changes. POSITIVE muscle aches  Objective  Blood pressure 110/80, pulse 74, height '5\' 7"'$  (1.702 m), weight 168 lb (76.2 kg), SpO2 98 %.   General: No apparent distress alert and oriented x3 mood and affect normal, dressed appropriately.  HEENT: Pupils equal, extraocular movements intact   Respiratory: Patient's speak in full sentences and does not appear short of breath  Cardiovascular: No lower extremity edema, non tender, no erythema  Patient does have significant tightness noted in the parascapular region on the right side.  Symptoms in the rhomboid, trapezius, and the latissimus dorsi.  Significant discomfort noted.  No midline tenderness noted.  Osteopathic findings  C2 flexed rotated and side bent right C6 flexed rotated and side bent right  T6 extended rotated and side bent right inhaled rib T9 extended rotated and side bent right with inhaled rib L2 flexed rotated and side bent right Sacrum right on right  After verbal consent patient was prepped with alcohol swabs and with a 25-gauge half inch needle injected into 4 distinct trigger points in the rhomboid, trapezius, and latissimus dorsi.  A total of 3 cc of 0.5% Marcaine and 1 cc of Kenalog 40 mg/mL use.  No blood loss.  Band-Aid placed.  Postinjection instructions given     Assessment and Plan:  Rib pain on right side Worsening pain on the right side.  Had some trigger nodules noted.  Injection given today.  Discussed icing regimen and home exercises.  Worsening pain will need x-rays but seem to be more of a muscle injury.  Does have the muscle relaxer noted.  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  I do think that this could have contributed to some of the muscle pain the patient is having as well.  Trigger point  of shoulder region, right Injection given today, discussed which activities to do.  RTC in 6 weeks.    Nonallopathic problems  Decision today to treat with OMT was based on Physical Exam  After verbal consent patient was treated with HVLA, ME, FPR techniques in cervical, rib, thoracic, lumbar, and sacral  areas  Patient tolerated the procedure well with improvement in symptoms  Patient given exercises, stretches and lifestyle  modifications  See medications in patient instructions if given  Patient will follow up in 4-8 weeks     The above documentation has been reviewed and is accurate and complete Lyndal Pulley, DO        Note: This dictation was prepared with Dragon dictation along with smaller phrase technology. Any transcriptional errors that result from this process are unintentional.

## 2021-06-29 ENCOUNTER — Encounter: Payer: Self-pay | Admitting: Family Medicine

## 2021-06-29 ENCOUNTER — Ambulatory Visit: Payer: Federal, State, Local not specified - PPO | Admitting: Family Medicine

## 2021-06-29 VITALS — BP 110/80 | HR 74 | Ht 67.0 in | Wt 168.0 lb

## 2021-06-29 DIAGNOSIS — M25511 Pain in right shoulder: Secondary | ICD-10-CM | POA: Diagnosis not present

## 2021-06-29 DIAGNOSIS — M9908 Segmental and somatic dysfunction of rib cage: Secondary | ICD-10-CM

## 2021-06-29 DIAGNOSIS — M9904 Segmental and somatic dysfunction of sacral region: Secondary | ICD-10-CM | POA: Diagnosis not present

## 2021-06-29 DIAGNOSIS — M940 Chondrocostal junction syndrome [Tietze]: Secondary | ICD-10-CM | POA: Diagnosis not present

## 2021-06-29 DIAGNOSIS — R0781 Pleurodynia: Secondary | ICD-10-CM

## 2021-06-29 DIAGNOSIS — M9903 Segmental and somatic dysfunction of lumbar region: Secondary | ICD-10-CM | POA: Diagnosis not present

## 2021-06-29 DIAGNOSIS — M9902 Segmental and somatic dysfunction of thoracic region: Secondary | ICD-10-CM

## 2021-06-29 DIAGNOSIS — M9901 Segmental and somatic dysfunction of cervical region: Secondary | ICD-10-CM

## 2021-06-29 NOTE — Assessment & Plan Note (Signed)
Injection given today, discussed which activities to do.  RTC in 6 weeks.

## 2021-06-29 NOTE — Patient Instructions (Addendum)
Trigger point injection today See me again in 5-7 weeks Start exercising in 1-2 days

## 2021-06-29 NOTE — Assessment & Plan Note (Signed)
>>  ASSESSMENT AND PLAN FOR RIB PAIN ON RIGHT SIDE WRITTEN ON 06/29/2021  1:30 PM BY Antoine Primas M, DO  Worsening pain on the right side.  Had some trigger nodules noted.  Injection given today.  Discussed icing regimen and home exercises.  Worsening pain will need x-rays but seem to be more of a muscle injury.  Does have the muscle relaxer noted.  Follow-up with me again in 6 to 8 weeks

## 2021-06-29 NOTE — Assessment & Plan Note (Signed)
Worsening pain on the right side.  Had some trigger nodules noted.  Injection given today.  Discussed icing regimen and home exercises.  Worsening pain will need x-rays but seem to be more of a muscle injury.  Does have the muscle relaxer noted.  Follow-up with me again in 6 to 8 weeks

## 2021-06-29 NOTE — Assessment & Plan Note (Addendum)
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  I do think that this could have contributed to some of the muscle pain the patient is having as well.  >>ASSESSMENT AND PLAN FOR NONALLOPATHIC LESION-RIB CAGE WRITTEN ON 09/06/2022  2:50 PM BY [name], CHARLOTTE LUM, NP  >>ASSESSMENT AND PLAN FOR RIB PAIN ON RIGHT SIDE WRITTEN ON 06/29/2021  1:30 PM BY Antoine Primas M, DO  Worsening pain on the right side.  Had some trigger nodules noted.  Injection given today.  Discussed icing regimen and home exercises.  Worsening pain will need x-rays but seem to be more of a muscle injury.  Does have the muscle relaxer noted.  Follow-up with me again in 6 to 8 weeks

## 2021-07-30 ENCOUNTER — Encounter (HOSPITAL_COMMUNITY): Payer: Self-pay

## 2021-07-30 ENCOUNTER — Other Ambulatory Visit: Payer: Self-pay

## 2021-07-30 ENCOUNTER — Telehealth: Payer: Self-pay | Admitting: Nurse Practitioner

## 2021-07-30 ENCOUNTER — Emergency Department (HOSPITAL_COMMUNITY): Payer: Federal, State, Local not specified - PPO

## 2021-07-30 ENCOUNTER — Emergency Department (HOSPITAL_COMMUNITY)
Admission: EM | Admit: 2021-07-30 | Discharge: 2021-07-30 | Disposition: A | Discharge status: Home / Self Care | Payer: Federal, State, Local not specified - PPO | Attending: Emergency Medicine | Admitting: Emergency Medicine

## 2021-07-30 DIAGNOSIS — R1030 Lower abdominal pain, unspecified: Secondary | ICD-10-CM | POA: Diagnosis not present

## 2021-07-30 DIAGNOSIS — R109 Unspecified abdominal pain: Secondary | ICD-10-CM | POA: Diagnosis not present

## 2021-07-30 DIAGNOSIS — R197 Diarrhea, unspecified: Secondary | ICD-10-CM

## 2021-07-30 DIAGNOSIS — N281 Cyst of kidney, acquired: Secondary | ICD-10-CM | POA: Diagnosis not present

## 2021-07-30 DIAGNOSIS — E039 Hypothyroidism, unspecified: Secondary | ICD-10-CM | POA: Insufficient documentation

## 2021-07-30 DIAGNOSIS — R110 Nausea: Secondary | ICD-10-CM | POA: Diagnosis not present

## 2021-07-30 LAB — URINALYSIS, ROUTINE W REFLEX MICROSCOPIC
Bilirubin Urine: NEGATIVE
Glucose, UA: NEGATIVE mg/dL
Ketones, ur: NEGATIVE mg/dL
Nitrite: NEGATIVE
Protein, ur: 30 mg/dL — AB
Specific Gravity, Urine: 1.029 (ref 1.005–1.030)
pH: 5 (ref 5.0–8.0)

## 2021-07-30 LAB — CBC WITH DIFFERENTIAL/PLATELET
Abs Immature Granulocytes: 0.01 10*3/uL (ref 0.00–0.07)
Basophils Absolute: 0 10*3/uL (ref 0.0–0.1)
Basophils Relative: 0 %
Eosinophils Absolute: 0 10*3/uL (ref 0.0–0.5)
Eosinophils Relative: 0 %
HCT: 45.9 % (ref 36.0–46.0)
Hemoglobin: 14.9 g/dL (ref 12.0–15.0)
Immature Granulocytes: 0 %
Lymphocytes Relative: 32 %
Lymphs Abs: 1.6 10*3/uL (ref 0.7–4.0)
MCH: 28.6 pg (ref 26.0–34.0)
MCHC: 32.5 g/dL (ref 30.0–36.0)
MCV: 88.1 fL (ref 80.0–100.0)
Monocytes Absolute: 0.6 10*3/uL (ref 0.1–1.0)
Monocytes Relative: 12 %
Neutro Abs: 2.7 10*3/uL (ref 1.7–7.7)
Neutrophils Relative %: 56 %
Platelets: 208 10*3/uL (ref 150–400)
RBC: 5.21 MIL/uL — ABNORMAL HIGH (ref 3.87–5.11)
RDW: 12.6 % (ref 11.5–15.5)
WBC: 4.9 10*3/uL (ref 4.0–10.5)
nRBC: 0 % (ref 0.0–0.2)

## 2021-07-30 LAB — COMPREHENSIVE METABOLIC PANEL
ALT: 15 U/L (ref 0–44)
AST: 18 U/L (ref 15–41)
Albumin: 4.3 g/dL (ref 3.5–5.0)
Alkaline Phosphatase: 39 U/L (ref 38–126)
Anion gap: 6 (ref 5–15)
BUN: 9 mg/dL (ref 6–20)
CO2: 28 mmol/L (ref 22–32)
Calcium: 9.2 mg/dL (ref 8.9–10.3)
Chloride: 104 mmol/L (ref 98–111)
Creatinine, Ser: 1.11 mg/dL — ABNORMAL HIGH (ref 0.44–1.00)
GFR, Estimated: 60 mL/min — ABNORMAL LOW (ref >60)
Glucose, Bld: 99 mg/dL (ref 70–99)
Potassium: 3.9 mmol/L (ref 3.5–5.1)
Sodium: 138 mmol/L (ref 135–145)
Total Bilirubin: 0.6 mg/dL (ref 0.3–1.2)
Total Protein: 7.9 g/dL (ref 6.5–8.1)

## 2021-07-30 LAB — I-STAT BETA HCG BLOOD, ED (MC, WL, AP ONLY): I-stat hCG, quantitative: <5 m[IU]/mL (ref <5)

## 2021-07-30 LAB — LIPASE, BLOOD: Lipase: 31 U/L (ref 11–51)

## 2021-07-30 LAB — PROTIME-INR
INR: 1.1 (ref 0.8–1.2)
Prothrombin Time: 13.6 seconds (ref 11.4–15.2)

## 2021-07-30 LAB — POC OCCULT BLOOD, ED: Fecal Occult Bld: POSITIVE — AB

## 2021-07-30 MED ORDER — IOHEXOL 300 MG/ML  SOLN
100.0000 mL | Freq: Once | INTRAMUSCULAR | Status: AC | PRN
  Administered 2021-07-30: 100 mL via INTRAVENOUS

## 2021-07-30 MED ORDER — SODIUM CHLORIDE 0.9 % IV BOLUS
1000.0000 mL | Freq: Once | INTRAVENOUS | Status: AC
  Administered 2021-07-30: 1000 mL via INTRAVENOUS

## 2021-07-30 NOTE — Telephone Encounter (Signed)
Noted  

## 2021-07-30 NOTE — ED Triage Notes (Signed)
Patient c/o lower abdominal pain and right mid abdominal pain yesterday. This Am patient noted that she had a small amount of bright red blood with a small clot present in the photo that she had.

## 2021-07-30 NOTE — Discharge Instructions (Signed)
Recheck with your primary care provider.  Return to the emergency room for worsening or concerning symptoms.

## 2021-07-30 NOTE — ED Provider Triage Note (Signed)
Emergency Medicine Provider Triage Evaluation Note  Christina [name] , a 51 y.o. female  was evaluated in triage.  Pt complains of rectal bleeding.  Yesterday she had multiple bowel movements and diarrhea, this morning around 1 AM she is having diffuse abdominal pain.  She is been having bright red blood in toilet including a blood clot.  Denies any nausea or vomiting, no chest pain.  Not on blood thinners..  Review of Systems  Per HPI  Physical Exam  BP (!) 121/99 (BP Location: Left Arm)   Pulse 85   Temp 98.5 F (36.9 C) (Oral)   Resp 18   Ht '5\' 7"'$  (1.660 m)   Wt 74.8 kg   LMP 07/08/2021   SpO2 99%   BMI 25.84 kg/m  Gen:   Awake, no distress   Resp:  Normal effort  MSK:   Moves extremities without difficulty  Other:  Abdomen is soft, not particularly tender  Medical Decision Making  Medically screening exam initiated at 5:53 PM.  Appropriate orders placed.  Christina [name] was informed that the remainder of the evaluation will be completed by another provider, this initial triage assessment does not replace that evaluation, and the importance of remaining in the ED until their evaluation is complete.     Sherrill Raring, PA-C 07/30/21 1754

## 2021-07-30 NOTE — ED Provider Notes (Signed)
[facility] DEPT Provider Note   CSN: [id number] Arrival date & time: 07/30/21  1733     History  Chief Complaint  Patient presents with   Abdominal Pain   Diarrhea   Rectal Bleeding    Mignon D. Lodato is a 51 y.o. female.  51 year old female with past medical history of hypothyroid, migraine, hyperlipidemia and GERD presents with concern for lower abdominal pain with nausea and diarrhea.  Symptoms started around 4:00 yesterday with abdominal cramping.  Patient noted bright red blood in her stools which concerned her this evening and prompted visit to the emergency room.  She denies fevers, chills, vomiting, dysuria or changes in bladder habits.  Notes strong family history of colon cancer which concerned her.  Last colonoscopy was 3 years ago and noted to be normal.  No history of diverticulitis.  No recent travel, no recent antibiotics, no sick contacts.       Home Medications Prior to Admission medications   Medication Sig Start Date End Date Taking? Authorizing Provider  cyclobenzaprine (FLEXERIL) 5 MG tablet Take 1 tablet (5 mg total) by mouth at bedtime as needed for muscle spasms. 12/09/20   [name], Charlene Brooke, NP  Vitamin D, Ergocalciferol, (DRISDOL) 1.25 MG (50000 UNIT) CAPS capsule Take 1 capsule (50,000 Units total) by mouth every 7 (seven) days. 12/10/20   [name], Charlene Brooke, NP      Allergies    Topamax [topiramate]    Review of Systems   Review of Systems Negative except as per HPI Physical Exam Updated Vital Signs BP 129/85   Pulse 80   Temp 98.5 F (36.9 C) (Oral)   Resp 13   Ht '5\' 7"'$  (1.702 m)   Wt 74.8 kg   LMP 07/08/2021   SpO2 100%   BMI 25.84 kg/m  Physical Exam Vitals and nursing note reviewed.  Constitutional:      General: She is not in acute distress.    Appearance: She is well-developed. She is not diaphoretic.  HENT:     Head: Normocephalic and atraumatic.  Cardiovascular:     Rate and Rhythm:  Normal rate and regular rhythm.     Heart sounds: Normal heart sounds.  Pulmonary:     Effort: Pulmonary effort is normal.     Breath sounds: Normal breath sounds.  Abdominal:     Palpations: Abdomen is soft.     Tenderness: There is abdominal tenderness in the right lower quadrant and left lower quadrant. There is no guarding or rebound.  Skin:    General: Skin is warm and dry.     Findings: No erythema or rash.  Neurological:     Mental Status: She is alert and oriented to person, place, and time.  Psychiatric:        Behavior: Behavior normal.     ED Results / Procedures / Treatments   Labs (all labs ordered are listed, but only abnormal results are displayed) Labs Reviewed  CBC WITH DIFFERENTIAL/PLATELET - Abnormal; Notable for the following components:      Result Value   RBC 5.21 (*)    All other components within normal limits  COMPREHENSIVE METABOLIC PANEL - Abnormal; Notable for the following components:   Creatinine, Ser 1.11 (*)    GFR, Estimated 60 (*)    All other components within normal limits  URINALYSIS, ROUTINE W REFLEX MICROSCOPIC - Abnormal; Notable for the following components:   APPearance HAZY (*)    Hgb urine dipstick SMALL (*)  Protein, ur 30 (*)    Leukocytes,Ua LARGE (*)    Bacteria, UA RARE (*)    All other components within normal limits  POC OCCULT BLOOD, ED - Abnormal; Notable for the following components:   Fecal Occult Bld POSITIVE (*)    All other components within normal limits  LIPASE, BLOOD  PROTIME-INR  I-STAT BETA HCG BLOOD, ED ([facility], [facility], [facility] ONLY)    EKG None  Radiology CT Abdomen Pelvis W Contrast  Result Date: 07/30/2021 CLINICAL DATA:  Mid to lower right-sided abdominal pain since yesterday, blood in stool EXAM: CT ABDOMEN AND PELVIS WITH CONTRAST TECHNIQUE: Multidetector CT imaging of the abdomen and pelvis was performed using the standard protocol following bolus administration of intravenous contrast. RADIATION DOSE  REDUCTION: This exam was performed according to the departmental dose-optimization program which includes automated exposure control, adjustment of the mA and/or kV according to patient size and/or use of iterative reconstruction technique. CONTRAST:  155m OMNIPAQUE IOHEXOL 300 MG/ML  SOLN COMPARISON:  None Available. FINDINGS: Lower chest: No acute pleural or parenchymal lung disease. Hepatobiliary: No focal liver abnormality is seen. No gallstones, gallbladder wall thickening, or biliary dilatation. Pancreas: Unremarkable. No pancreatic ductal dilatation or surrounding inflammatory changes. Spleen: Normal in size without focal abnormality. Adrenals/Urinary Tract: Simple cyst lower pole left kidney does not require imaging follow-up. Otherwise normal enhancement of the renal parenchyma. No urinary tract calculi or obstructive uropathy. The adrenals and bladder are unremarkable. Stomach/Bowel: No bowel obstruction or ileus. Normal appendix right lower quadrant. No bowel wall thickening or inflammatory change. Vascular/Lymphatic: No significant vascular findings are present. No enlarged abdominal or pelvic lymph nodes. Reproductive: Large intramural uterine fibroid ventral aspect uterine fundus measuring up to 5.4 cm. Simple appearing 2.6 cm right adnexal cyst. The left ovary is unremarkable. Other: Small amount of free fluid in the lower pelvis is nonspecific in a perimenopausal patient, but is likely physiologic. No free intraperitoneal gas. No abdominal wall hernia. Musculoskeletal: No acute or destructive bony lesions. Reconstructed images demonstrate no additional findings. IMPRESSION: 1. Small amount of free fluid within the lower pelvis, likely physiologic. 2. No acute intra-abdominal or intrapelvic process. 3. Large uterine fibroid. 4. 2.6 cm right adnexal simple-appearing cyst. No follow-up imaging is recommended. Reference: JACR 2020 Feb;17(2):248-254 Electronically Signed   By: MRanda NgoM.D.   On:  07/30/2021 20:57    Procedures Procedures    Medications Ordered in ED Medications  sodium chloride 0.9 % bolus 1,000 mL (1,000 mLs Intravenous New Bag/Given 07/30/21 2034)  iohexol (OMNIPAQUE) 300 MG/ML solution 100 mL (100 mLs Intravenous Contrast Given 07/30/21 2040)    ED Course/ Medical Decision Making/ A&P                           Medical Decision Making Amount and/or Complexity of Data Reviewed Radiology: ordered.  Risk Prescription drug management.   This patient presents to the ED for concern of lower abdominal pain with nausea, diarrhea with blood in diarrhea today, this involves an extensive number of treatment options, and is a complaint that carries with it a high risk of complications and morbidity.  The differential diagnosis includes but not limited to gastroenteritis, colitis, diverticulitis, appendicitis   Co morbidities that complicate the patient evaluation  Hypothyroid, migraine, hyperlipidemia, GERD   Additional history obtained:  Additional history obtained from husband at bedside who is concerned for patient's strong family history of colon cancer External records from outside source obtained  and reviewed including prior colonoscopy from May 2020 which is normal, scheduled for repeat in 2026   Lab Tests:  I Ordered, and personally interpreted labs.  The pertinent results include: CMP with creatinine 1.11, slight increase from prior.  CBC with normal WBC, normal H&H.  Urinalysis positive for leukocytes, contaminated sample without urinary symptoms.  Lipase normal.  INR normal.  hCG negative.  Hemoccult positive.   Imaging Studies ordered:  I ordered imaging studies including CT abdomen pelvis I independently visualized and interpreted imaging which showed no acute abnormality, known fibroid I agree with the radiologist interpretation  Problem List / ED Course / Critical interventions / Medication management  51 year old female with concern for  lower abdominal pain with nausea, diarrhea with blood in diarrhea today.  On exam has mild lower abdominal discomfort without guarding or rebound.  Labs reviewed, slight increase in creatinine, given IV fluids.  Normal white count.  No prior CT and pelvis on file, no history of diverticulosis, prior colonoscopy from 3 years ago unremarkable.  Discussed with patient, will order CT on pelvis to evaluate for diverticulitis versus colitis.  CT is reassuring.  Plan is to follow-up with PCP for recheck. I ordered medication including IV fluids for mildly elevated creatinine Reevaluation of the patient after these medicines showed that the patient improved I have reviewed the patients home medicines and have made adjustments as needed   Social Determinants of Health:  Has PCP and GI for follow-up   Test / Admission - Considered:  Consider admission for further GI work-up however CT scan is reassuring, H&H normal, vital stable, not anticoagulated         Final Clinical Impression(s) / ED Diagnoses Final diagnoses:  Lower abdominal pain  Diarrhea, unspecified type  Nausea    Rx / DC Orders ED Discharge Orders     None         Tacy Learn, PA-C 07/30/21 2108    Lucrezia Starch, MD 08/01/21 1550

## 2021-07-30 NOTE — Telephone Encounter (Signed)
FYI:Pt called in complaining of abd pain. Last night she had a pain of 10, 10 being severe from 1-3am. She also has bright red blood in her stool. I transferred her over to nurse triage.

## 2021-08-07 NOTE — Progress Notes (Unsigned)
[facility] [place] [facility] Phone: [phone] Subjective:   [name], am serving as a scribe for Dr. Hulan Saas.  I'm seeing this patient by the request  of:  [name], Charlene Brooke, NP  CC: Neck and back pain follow-up  IDP:[id number]  Lilya D. Corella is a 51 y.o. female coming in with complaint of back and neck pain. OMT on 06/29/2021. Patient states that injections last visit. Does feel some pain near middle of thoracic spine today.   Medications patient has been prescribed: None  Taking:         Reviewed prior external information including notes and imaging from previsou exam, outside providers and external EMR if available.   As well as notes that were available from care everywhere and other healthcare systems.  Past medical history, social, surgical and family history all reviewed in electronic medical record.  No pertanent information unless stated regarding to the chief complaint.   Past Medical History:  Diagnosis Date   GERD (gastroesophageal reflux disease) 2018   Hypercholesteremia    Hyperthyroidism    Migraines    with asthma   Slipping rib syndrome     Allergies  Allergen Reactions   Topamax [Topiramate]     Pain in legs, SOB, and almost passed out     Review of Systems:  No headache, visual changes, nausea, vomiting, diarrhea, constipation, dizziness, abdominal pain, skin rash, fevers, chills, night sweats, weight loss, swollen lymph nodes, body aches, joint swelling, chest pain, shortness of breath, mood changes. POSITIVE muscle aches  Objective  Blood pressure 112/82, pulse 80, height '5\' 7"'$  (1.702 m), weight 162 lb (73.5 kg), last menstrual period 07/08/2021, SpO2 98 %.   General: No apparent distress alert and oriented x3 mood and affect normal, dressed appropriately.  HEENT: Pupils equal, extraocular movements intact  Respiratory: Patient's speak in full sentences and does not  appear short of breath  Cardiovascular: No lower extremity edema, non tender, no erythema   Neck exam still has tightness noted on the right side of the neck in the parascapular region.  Patient does have some very mild limitation with right-sided rotation and left-sided sidebending.  Negative Spurling's test are noted.  No significant weakness noted.  No trigger points noted.  Osteopathic findings  C2 flexed rotated and side bent right C6 flexed rotated and side bent right T3 extended rotated and side bent right inhaled rib L2 flexed rotated and side bent left Sacrum right on right       Assessment and Plan:  Neck pain Patient does have tightness in the neck noted previously.  Discussed posture and ergonomics, which activities to do which ones to avoid.  Increase activity slowly.  Follow-up again in 6 to 8 weeks    Nonallopathic problems  Decision today to treat with OMT was based on Physical Exam  After verbal consent patient was treated with HVLA, ME, FPR techniques in cervical, rib, thoracic, lumbar, and sacral  areas  Patient tolerated the procedure well with improvement in symptoms  Patient given exercises, stretches and lifestyle modifications  See medications in patient instructions if given  Patient will follow up in 4-8 weeks    The above documentation has been reviewed and is accurate and complete Lyndal Pulley, DO          Note: This dictation was prepared with Dragon dictation along with smaller phrase technology. Any transcriptional errors that result from this process are  unintentional.

## 2021-08-10 ENCOUNTER — Ambulatory Visit: Payer: Federal, State, Local not specified - PPO | Admitting: Family Medicine

## 2021-08-10 ENCOUNTER — Encounter: Payer: Self-pay | Admitting: Family Medicine

## 2021-08-10 VITALS — BP 112/82 | HR 80 | Ht 67.0 in | Wt 162.0 lb

## 2021-08-10 DIAGNOSIS — M9902 Segmental and somatic dysfunction of thoracic region: Secondary | ICD-10-CM | POA: Diagnosis not present

## 2021-08-10 DIAGNOSIS — M9903 Segmental and somatic dysfunction of lumbar region: Secondary | ICD-10-CM

## 2021-08-10 DIAGNOSIS — M9908 Segmental and somatic dysfunction of rib cage: Secondary | ICD-10-CM

## 2021-08-10 DIAGNOSIS — M9901 Segmental and somatic dysfunction of cervical region: Secondary | ICD-10-CM | POA: Diagnosis not present

## 2021-08-10 DIAGNOSIS — M542 Cervicalgia: Secondary | ICD-10-CM

## 2021-08-10 DIAGNOSIS — M9904 Segmental and somatic dysfunction of sacral region: Secondary | ICD-10-CM | POA: Diagnosis not present

## 2021-08-10 NOTE — Patient Instructions (Signed)
You are awesome Stay active  See me again in 6-8 weeks

## 2021-08-10 NOTE — Assessment & Plan Note (Signed)
Patient does have tightness in the neck noted previously.  Discussed posture and ergonomics, which activities to do which ones to avoid.  Increase activity slowly.  Follow-up again in 6 to 8 weeks

## 2021-08-19 DIAGNOSIS — K080 Exfoliation of teeth due to systemic causes: Secondary | ICD-10-CM | POA: Diagnosis not present

## 2021-09-15 IMAGING — MG MM DIGITAL DIAGNOSTIC UNILAT*R* W/ TOMO W/ CAD
8 series · 8 of 24 positions shown · non-contrast
Comparison: Previous exam(s).

CLINICAL DATA: Palpable abnormality in the RIGHT breast for 10
days. On physical exam, clear fluid was expressed from the RIGHT
nipple by the patient's provider. Patient has noted no spontaneous
discharge.

EXAM:
DIGITAL DIAGNOSTIC UNILATERAL RIGHT MAMMOGRAM WITH TOMOSYNTHESIS AND
CAD; ULTRASOUND RIGHT BREAST LIMITED
TECHNIQUE: Right digital diagnostic mammography and breast tomosynthesis was
performed. The images were evaluated with computer-aided detection.;
Targeted ultrasound examination of the right breast was performed

[R CC synth-2D]
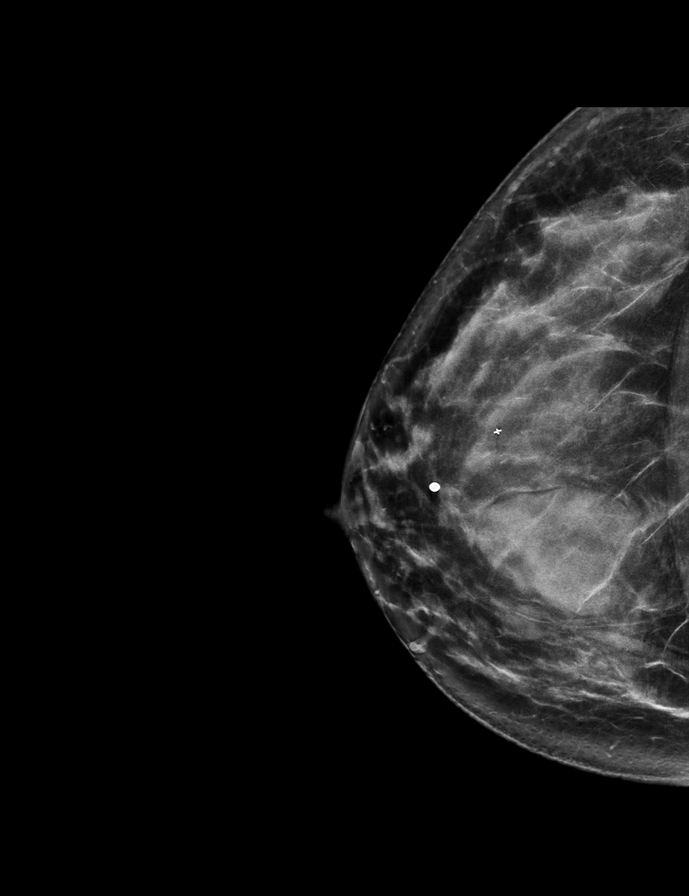

[R ML synth-2D]
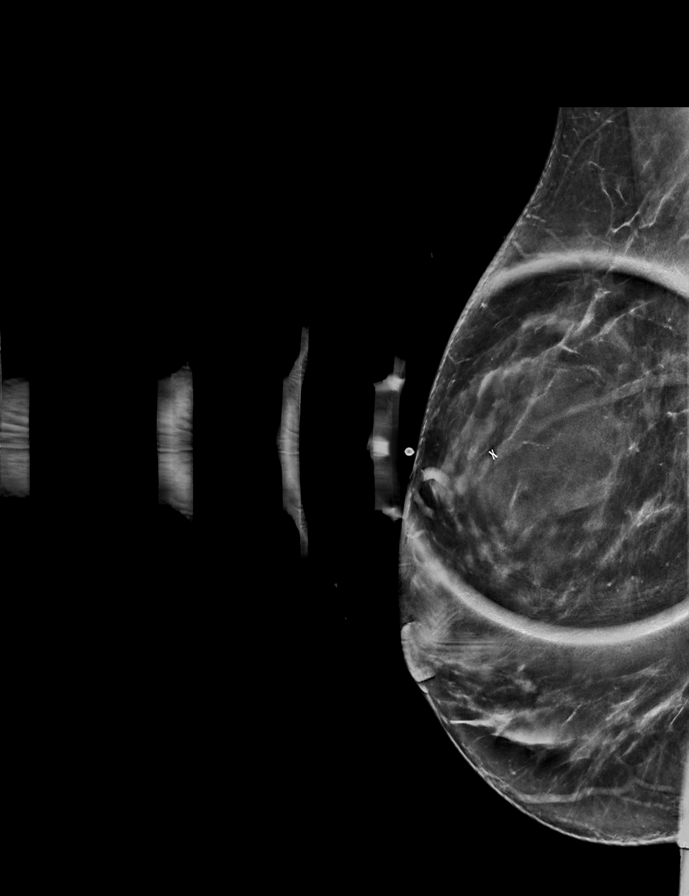

[R MLO synth-2D (1 of 2)]
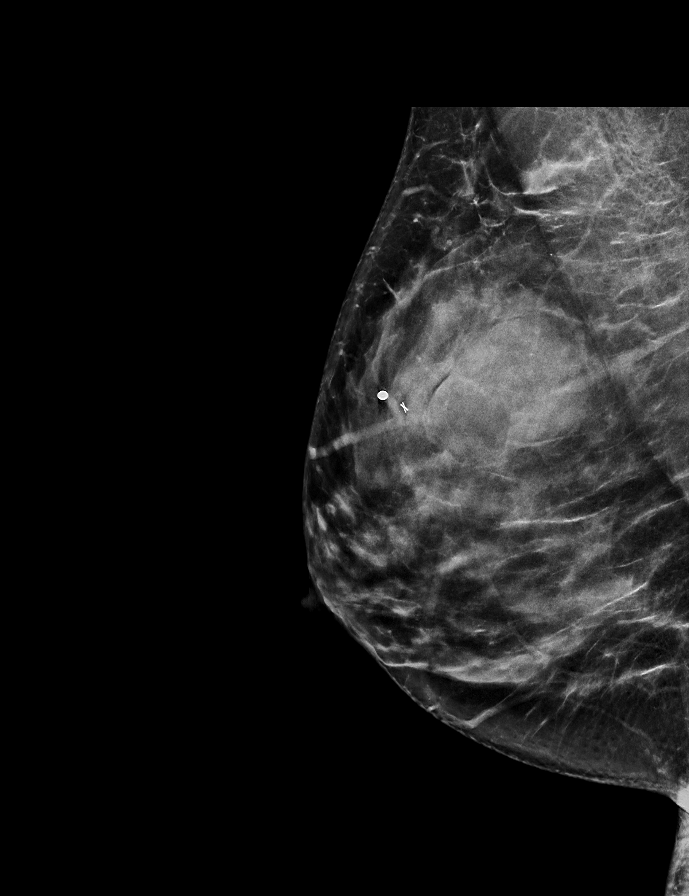

[R MLO synth-2D (2 of 2)]
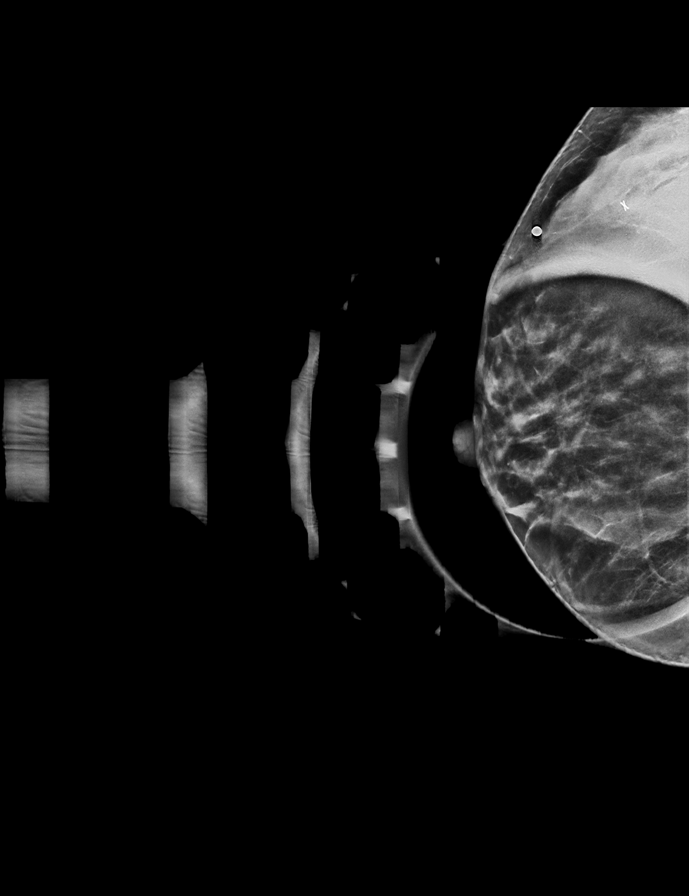

[R CC tomo · tomo slice 35/68.0]
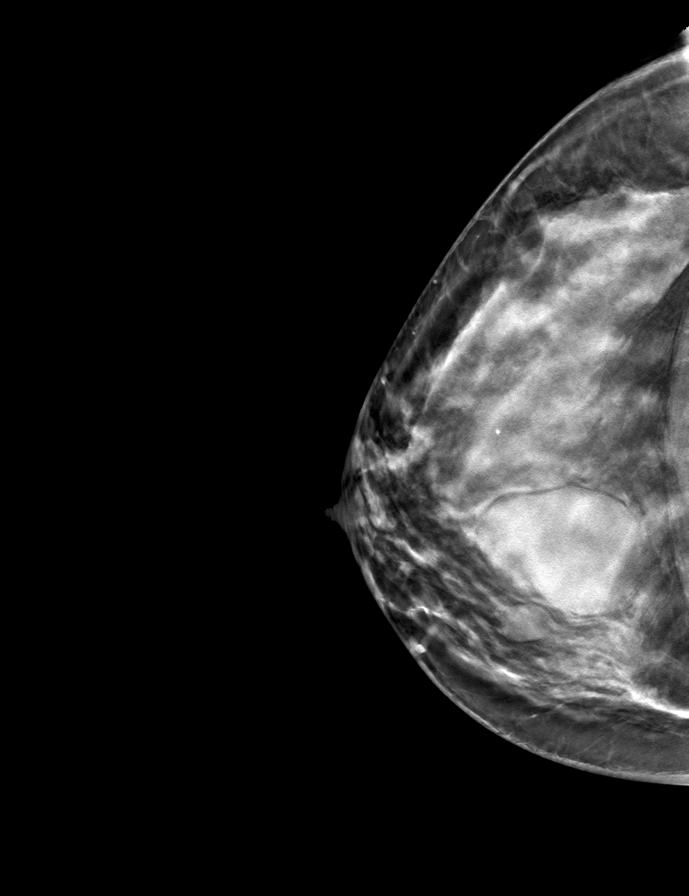

[R MLO tomo (1 of 2) · tomo slice 34/67.0]
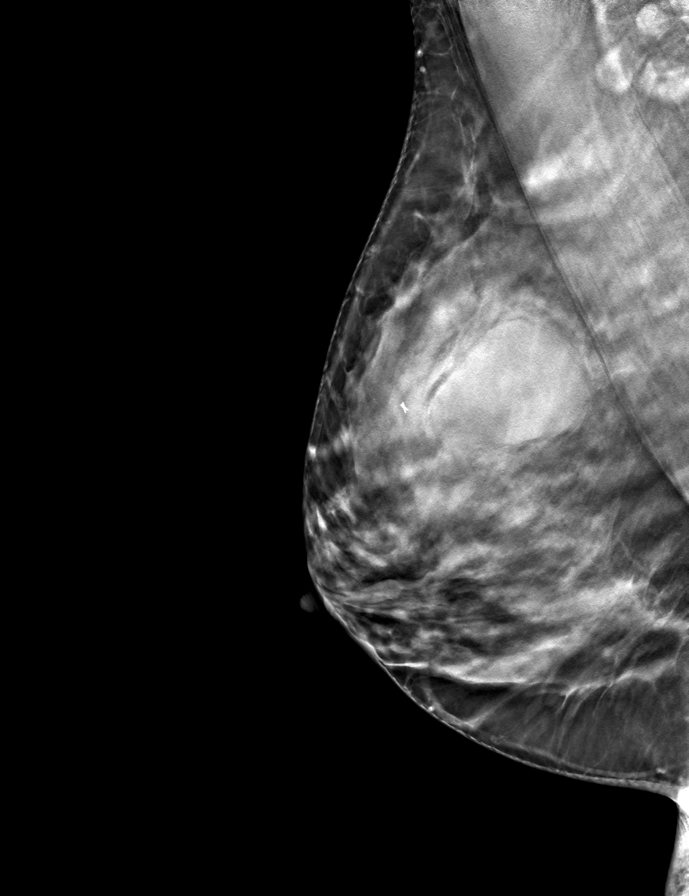

[R ML tomo · tomo slice 36/71.0]
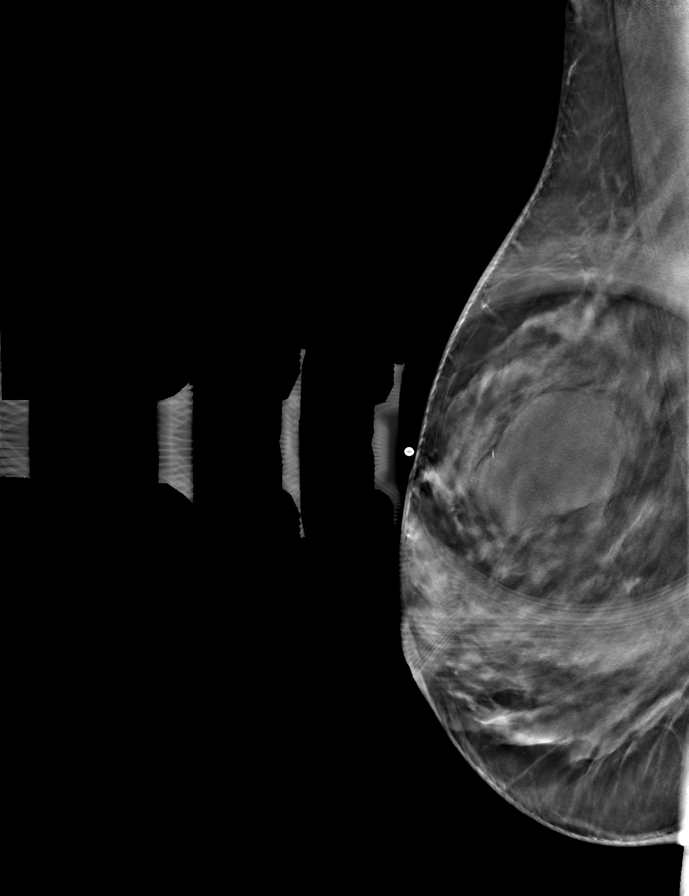

[R MLO tomo (2 of 2) · tomo slice 29/57.0]
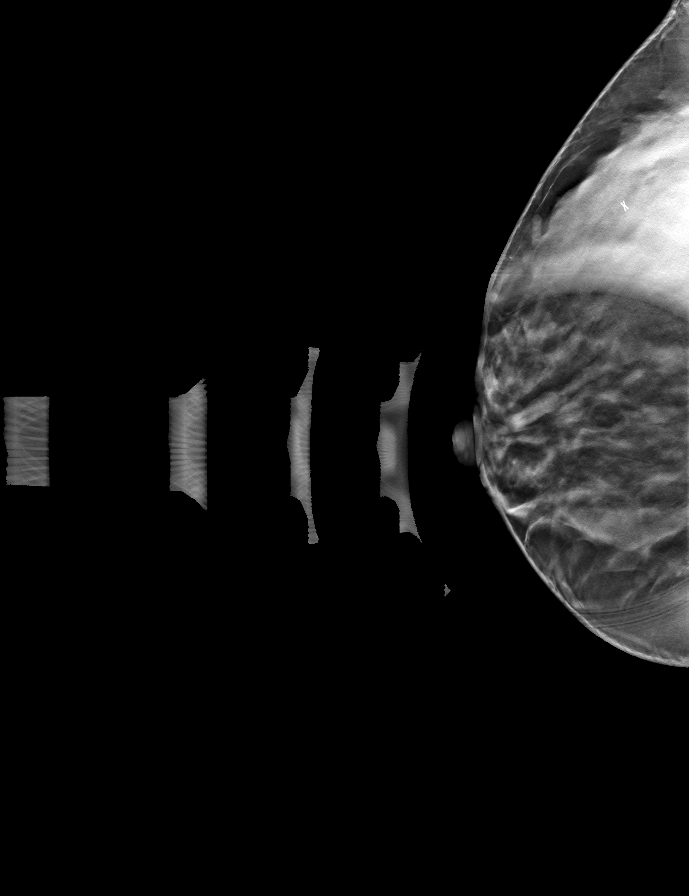

[8 of 24 positions shown; findings below may reference images not displayed]

ACR Breast Density Category c: The breast tissue is heterogeneously
dense, which may obscure small masses.
FINDINGS: Within the UPPER INNER QUADRANT of the RIGHT breast, there is a
circumscribed oval mass corresponding to the area of patient's
concern and marked with BBs. Mass measures approximately
centimeters. An adjacent 1.3 centimeter mass has similar features.
No distortion or suspicious microcalcifications. Tissue marker clip
is identified in the UPPER central RIGHT breast.

On physical exam, I palpate a mobile discrete mass in the 12 o'clock
location of the RIGHT breast. Mass is nontender on exam.

Targeted ultrasound is performed, showing a simple cyst in the 12
o'clock location 4 centimeters from the nipple which measures 3.6 x
3.1 x 2.3 centimeters. No solid component or areas of acoustic
shadowing. A similar mass in the 1 o'clock location 4 centimeters
from the nipple is 1.0 x 0.7 x 1.6 centimeters.
IMPRESSION: Benign cysts in the RIGHT breast.

No mammographic or ultrasound evidence for malignancy.

Non spontaneous nipple discharge.  No further evaluation needed.

RECOMMENDATION:
Recommend screening mammogram in September 2020.

I have discussed the findings and recommendations with the patient.
If applicable, a reminder letter will be sent to the patient
regarding the next appointment.

BI-RADS CATEGORY  2: Benign.

## 2021-09-21 ENCOUNTER — Encounter: Payer: Self-pay | Admitting: Family Medicine

## 2021-09-21 ENCOUNTER — Ambulatory Visit: Payer: Federal, State, Local not specified - PPO | Admitting: Family Medicine

## 2021-09-21 VITALS — BP 122/82 | HR 76 | Temp 97.2°F | Ht 67.0 in | Wt 164.2 lb

## 2021-09-21 DIAGNOSIS — R519 Headache, unspecified: Secondary | ICD-10-CM | POA: Insufficient documentation

## 2021-09-21 DIAGNOSIS — K118 Other diseases of salivary glands: Secondary | ICD-10-CM

## 2021-09-21 MED ORDER — MELOXICAM 7.5 MG PO TABS: 7.5000 mg | ORAL_TABLET | Freq: Every day | ORAL | 0 refills | Status: DC

## 2021-09-21 NOTE — Progress Notes (Signed)
Established Patient Office Visit  Subjective   Patient ID: Christina [name], female    DOB: 1970/07/19  Age: 51 y.o. MRN: [id number]  Chief Complaint  Patient presents with   Facial Pain    Facial pain on left side, little ear pain also left side of head painful symptoms x 3 weeks.     HPI reports a 3-week history of intermittent left facial pain.  Pain seems to come and go as well and may last up to 15 minutes.  It spreads from her mandible to her chin and then up the side of her head.  There are no prodromal symptoms such as scotomata or change in vision.  There is no nausea or vomiting.  She is seeing her dentist who referred her on to an endodontist for evaluation.  He did not believe her pain had a dental origin.  She does not grind her teeth.  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  Hearing has not been affected.  She denies allergy symptoms other than itchy ear canals.  Vision has not been affected.    Review of Systems  Constitutional:  Negative for chills, diaphoresis, malaise/fatigue and weight loss.  HENT:  Positive for ear pain. Negative for congestion, ear discharge, hearing loss, sinus pain and tinnitus.   Eyes: Negative.  Negative for blurred vision and double vision.  Respiratory: Negative.    Cardiovascular:  Negative for chest pain.  Gastrointestinal:  Negative for abdominal pain.  Genitourinary: Negative.   Musculoskeletal:  Negative for falls and myalgias.  Skin:  Negative for rash.  Neurological:  Negative for tingling, speech change, loss of consciousness and weakness.  Psychiatric/Behavioral: Negative.        Objective:     BP 122/82 (BP Location: Right Arm, Patient Position: Sitting, Cuff Size: Normal)   Pulse 76   Temp (!) 97.2 F (36.2 C) (Temporal)   Ht '5\' 7"'$  (1.702 m)   Wt 164 lb 3.2 oz (74.5 kg)   SpO2 97%   BMI 25.72 kg/m    Physical Exam Constitutional:      General: She is not in acute distress.    Appearance: Normal  appearance. She is not ill-appearing, toxic-appearing or diaphoretic.  HENT:     Head: Normocephalic and atraumatic.      Right Ear: Tympanic membrane, ear canal and external ear normal.     Left Ear: Tympanic membrane, ear canal and external ear normal.     Mouth/Throat:     Mouth: Mucous membranes are moist.     Pharynx: Oropharynx is clear. No oropharyngeal exudate or posterior oropharyngeal erythema.  Eyes:     General: No scleral icterus.       Right eye: No discharge.        Left eye: No discharge.     Extraocular Movements: Extraocular movements intact.     Conjunctiva/sclera: Conjunctivae normal.     Pupils: Pupils are equal, round, and reactive to light.  Neck:   Cardiovascular:     Rate and Rhythm: Normal rate and regular rhythm.  Pulmonary:     Effort: Pulmonary effort is normal. No respiratory distress.     Breath sounds: Normal breath sounds.  Abdominal:     General: Bowel sounds are normal.     Tenderness: There is no abdominal tenderness. There is no guarding.  Musculoskeletal:     Cervical back: No rigidity or tenderness.  Skin:    General: Skin is  warm and dry.     Findings: No erythema or rash.  Neurological:     Mental Status: She is alert and oriented to person, place, and time.  Psychiatric:        Mood and Affect: Mood normal.        Behavior: Behavior normal.      No results found for any visits on 09/21/21.    The 10-year ASCVD risk score (Arnett DK, et al., 2019) is: 2.1%    Assessment & Plan:   Problem List Items Addressed This Visit       Other   Facial pain - Primary   Relevant Medications   meloxicam (MOBIC) 7.5 MG tablet   Other Relevant Orders   Sedimentation rate   Ambulatory referral to ENT   Submandibular gland mass   Relevant Orders   US Soft Tissue Head/Neck (NON-THYROID)   Ambulatory referral to ENT    Return follow up with primary provider..  Facial pain with no clear etiology. ? TMJ syndrome, trigeminal  neuralgia, dental issue... Continue Amoxil as given by dentist.  We will start meloxicam.  Soft tissue ultrasound for evaluation of submandibular mass.  Libby Maw, MD

## 2021-09-22 LAB — SEDIMENTATION RATE: Sed Rate: 5 mm/hr (ref 0–30)

## 2021-09-23 ENCOUNTER — Ambulatory Visit
Admission: RE | Admit: 2021-09-23 | Discharge: 2021-09-23 | Disposition: A | Discharge status: Home / Self Care | Payer: Federal, State, Local not specified - PPO | Source: Ambulatory Visit | Attending: Family Medicine | Admitting: Family Medicine

## 2021-09-23 ENCOUNTER — Encounter: Payer: Self-pay | Admitting: Family Medicine

## 2021-09-23 DIAGNOSIS — M542 Cervicalgia: Secondary | ICD-10-CM | POA: Diagnosis not present

## 2021-09-23 DIAGNOSIS — K118 Other diseases of salivary glands: Secondary | ICD-10-CM

## 2021-10-02 NOTE — Progress Notes (Unsigned)
[place] Phone: [phone] Subjective:    I'm seeing this patient by the request  of:  [name], Charlene Brooke, NP  CC: Back and neck pain follow-up  [id number]  Christina [name] is a 51 y.o. female coming in with complaint of back and neck pain. OMT on 08/10/2021. Patient states that she is alright a little sore but did more walking than she should have on saturday  Medications patient has been prescribed: None  Taking:         Reviewed prior external information including notes and imaging from previsou exam, outside providers and external EMR if available.   As well as notes that were available from care everywhere and other healthcare systems.  Past medical history, social, surgical and family history all reviewed in electronic medical record.  No pertanent information unless stated regarding to the chief complaint.   Past Medical History:  Diagnosis Date   GERD (gastroesophageal reflux disease) 2018   Hypercholesteremia    Hyperthyroidism    Migraines    with asthma   Slipping rib syndrome     Allergies  Allergen Reactions   Topamax [Topiramate]     Pain in legs, SOB, and almost passed out     Review of Systems:  No headache, visual changes, nausea, vomiting, diarrhea, constipation, dizziness, abdominal pain, skin rash, fevers, chills, night sweats, weight loss, swollen lymph nodes, body aches, joint swelling, chest pain, shortness of breath, mood changes. POSITIVE muscle aches  Objective  Blood pressure 124/80, pulse 78, height '5\' 7"'$  (1.702 m), weight 163 lb (73.9 kg), SpO2 97 %.   General: No apparent distress alert and oriented x3 mood and affect normal, dressed appropriately.  HEENT: Pupils equal, extraocular movements intact  Respiratory: Patient's speak in full sentences and does not appear short of breath  Cardiovascular: No lower extremity edema, non tender, no erythema   Gait MSK:  Back mild loss of lordosis.  Tightness noted with FABER test right greater than left.  Lacks last 5 degrees of extension in the last 10 degrees of flexion.  Osteopathic findings  C3 flexed rotated and side bent right C5 flexed rotated and side bent left T3 extended rotated and side bent right inhaled rib T8 extended rotated and side bent left L2 flexed rotated and side bent right Sacrum right on right       Assessment and Plan:  Patellofemoral syndrome, right Worsening patellofemoral patient states.  Given home exercises again.  If worsening pain can consider bracing.  Also can consider the possibility of injections.  Follow-up again in 6 weeks  SI (sacroiliac) joint dysfunction Chronic problem but stable.  Discussed hip abductor strengthening.  Follow-up again in 6 to 8 weeks    Nonallopathic problems  Decision today to treat with OMT was based on Physical Exam  After verbal consent patient was treated with HVLA, ME, FPR techniques in cervical, rib, thoracic, lumbar, and sacral  areas  Patient tolerated the procedure well with improvement in symptoms  Patient given exercises, stretches and lifestyle modifications  See medications in patient instructions if given  Patient will follow up in 4-8 weeks     The above documentation has been reviewed and is accurate and complete Lyndal Pulley, DO         Note: This dictation was prepared with Dragon dictation along with smaller phrase technology. Any transcriptional errors that result from this process are unintentional.

## 2021-10-05 ENCOUNTER — Ambulatory Visit: Payer: Federal, State, Local not specified - PPO | Admitting: Family Medicine

## 2021-10-05 VITALS — BP 124/80 | HR 78 | Ht 67.0 in | Wt 163.0 lb

## 2021-10-05 DIAGNOSIS — M9902 Segmental and somatic dysfunction of thoracic region: Secondary | ICD-10-CM

## 2021-10-05 DIAGNOSIS — M9903 Segmental and somatic dysfunction of lumbar region: Secondary | ICD-10-CM | POA: Diagnosis not present

## 2021-10-05 DIAGNOSIS — M533 Sacrococcygeal disorders, not elsewhere classified: Secondary | ICD-10-CM

## 2021-10-05 DIAGNOSIS — M9901 Segmental and somatic dysfunction of cervical region: Secondary | ICD-10-CM

## 2021-10-05 DIAGNOSIS — M222X1 Patellofemoral disorders, right knee: Secondary | ICD-10-CM

## 2021-10-05 DIAGNOSIS — M9904 Segmental and somatic dysfunction of sacral region: Secondary | ICD-10-CM | POA: Diagnosis not present

## 2021-10-05 DIAGNOSIS — M9908 Segmental and somatic dysfunction of rib cage: Secondary | ICD-10-CM

## 2021-10-05 NOTE — Patient Instructions (Signed)
Do prescribed exercises at least 3x a week

## 2021-10-05 NOTE — Assessment & Plan Note (Signed)
Worsening patellofemoral patient states.  Given home exercises again.  If worsening pain can consider bracing.  Also can consider the possibility of injections.  Follow-up again in 6 weeks

## 2021-10-05 NOTE — Assessment & Plan Note (Signed)
Chronic problem but stable.  Discussed hip abductor strengthening.  Follow-up again in 6 to 8 weeks

## 2021-10-11 ENCOUNTER — Encounter (HOSPITAL_COMMUNITY): Payer: Self-pay | Admitting: Emergency Medicine

## 2021-10-11 ENCOUNTER — Ambulatory Visit (HOSPITAL_COMMUNITY)
Admission: EM | Admit: 2021-10-11 | Discharge: 2021-10-11 | Disposition: A | Discharge status: Home / Self Care | Payer: Federal, State, Local not specified - PPO

## 2021-10-11 DIAGNOSIS — B9789 Other viral agents as the cause of diseases classified elsewhere: Secondary | ICD-10-CM | POA: Diagnosis not present

## 2021-10-11 DIAGNOSIS — K121 Other forms of stomatitis: Secondary | ICD-10-CM | POA: Diagnosis not present

## 2021-10-11 NOTE — ED Provider Notes (Signed)
[facility]    CSN: [id number] Arrival date & time: 10/11/21  1553      History   Chief Complaint Chief Complaint  Patient presents with   Oral Swelling    HPI Christina [name] is a 51 y.o. female.   51 year old female presents with mouth pain.  Patient indicates for the past week she has been having left mouth pain, tenderness, and irritation.  Patient indicates that the pain is present when she chews on the left side, and she gets burning with certain foods.  She indicates that she has been to 2 separate dentists and had conflicting opinions 1 said there was nothing wrong with her lower teeth where she has had the feelings the other 1 said that she may need to have a root canal.  Patient indicates she also went to her PCP who thought that there may be an infection present so she was placed on antibiotic which she completed but this did not relieve her pain.  She has been taking Mobic without relief from her discomfort.  Patient does indicate that she gets some pain when chewing on that side that is around the TMJ area but most of the pain is in front of it.  Patient does indicate that she noticed a little sore area in the back of the mouth on the left side on the mucosa and she is concerned about this may be possibly causing her pain.  She denies fever or chills although she has had some mild sinus congestion present.     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    Nonallopathic lesion of lumbosacral region 08/17/2017   Neck pain 03/18/2017   Hypercholesterolemia 03/08/2017   Left lumbar radiculopathy 09/07/2016   SI (sacroiliac) joint dysfunction 05/27/2016   Nonallopathic lesion of sacral region 05/27/2016   Whiplash injuries, initial encounter 04/14/2016   Low back pain 12/31/2015   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   Nonallopathic lesion of thoracic region 07/12/2014   Nonallopathic lesion-rib cage 07/12/2014   Nonallopathic lesion of cervical region 07/12/2014   Family history of breast cancer 07/10/2014    Class: Family History of   Rib pain on right side 06/21/2014   Routine general medical examination at a health care facility 05/27/2014   Migraine 04/03/2014   Hyperthyroidism 12/26/2013   Vitamin D deficiency 04/26/2013    Past Surgical History:  Procedure Laterality Date   BREAST BIOPSY Right 06/26/2014   benign per patient   COLONOSCOPY  08/03/2012   in [place]  09/23/2016    OB History     Gravida  3   Para  1   Term  1   Preterm      AB  2   Living  1      SAB  2   IAB      Ectopic      Multiple  Live Births  1            Home Medications    Prior to Admission medications   Medication Sig Start Date End Date Taking? Authorizing Provider  amoxicillin (AMOXIL) 500 MG capsule Take 500 mg by mouth 3 (three) times daily. 09/16/21   [provider]  cyclobenzaprine (FLEXERIL) 5 MG tablet Take 1 tablet (5 mg total) by mouth at bedtime as needed for muscle spasms. 12/09/20   [name], Charlene Brooke, NP  meloxicam (MOBIC) 7.5 MG tablet Take 1 tablet (7.5 mg total) by mouth daily. For 2 weeks 09/21/21   Libby Maw, MD  Vitamin D, Ergocalciferol, (DRISDOL) 1.25 MG (50000 UNIT) CAPS capsule Take 1 capsule (50,000 Units total) by mouth every 7 (seven) days. 12/10/20   [name], Charlene Brooke, NP    Family History Family History   Problem Relation Age of Onset   Diabetes Mother    Hypertension Mother    Colon cancer Mother 61   Breast cancer Mother 69   Cancer Mother    Prostate cancer Father    Cancer Father    Colon cancer Maternal Aunt    Colon cancer Maternal Aunt    Stomach cancer Neg Hx    Esophageal cancer Neg Hx    Colon polyps Neg Hx    Rectal cancer Neg Hx     Social History Social History   Tobacco Use   Smoking status: Never   Smokeless tobacco: Never  Vaping Use   Vaping Use: Never used  Substance Use Topics   Alcohol use: Yes   Drug use: No     Allergies   Topamax [topiramate]   Review of Systems Review of Systems  HENT:  Positive for dental problem (mouth pain).      Physical Exam Triage Vital Signs ED Triage Vitals  Enc Vitals Group     BP 10/11/21 1630 105/77     Pulse Rate 10/11/21 1630 77     Resp 10/11/21 1630 17     Temp 10/11/21 1630 98.3 F (36.8 C)     Temp Source 10/11/21 1630 Oral     SpO2 10/11/21 1630 97 %     Weight --      Height --      Head Circumference --      Peak Flow --      Pain Score 10/11/21 1629 8     Pain Loc --      Pain Edu? --      Excl. in [place]? --    No data found.  Updated Vital Signs BP 105/77 (BP Location: Left Arm)   Pulse 77   Temp 98.3 F (36.8 C) (Oral)   Resp 17   LMP 07/08/2021   SpO2 97%   Visual Acuity Right Eye Distance:   Left Eye Distance:   Bilateral Distance:    Right Eye Near:   Left Eye Near:    Bilateral Near:     Physical Exam Constitutional:      Appearance: Normal appearance.  HENT:     Right Ear: Tympanic membrane and ear canal normal.     Left Ear: Tympanic membrane and ear canal normal.     Mouth/Throat:     Comments: Mouth: The teeth on the left side upper and lower in the gum lines appear normal, the throat is normal with uvula midline, without any redness or swelling.  There is a small 2 x 2 millimeter viral ulcer that  is on the left mid buccal mucosa just above in front of the  wisdom tooth on the lower aspect.  This is tender when palpated with a tongue blade.  There is no drainage from the area. Neurological:     Mental Status: She is alert.      UC Treatments / Results  Labs (all labs ordered are listed, but only abnormal results are displayed) Labs Reviewed - No data to display  EKG   Radiology No results found.  Procedures Procedures (including critical care time)  Medications Ordered in UC Medications - No data to display  Initial Impression / Assessment and Plan / UC Course  I have reviewed the triage vital signs and the nursing notes.  Pertinent labs & imaging results that were available during my care of the patient were reviewed by me and considered in my medical decision making (see chart for details).       Plan: 1.  Advised that the viral sore heal in about 10 to 14 days given take several days. 2.  Advised to take Tylenol and ibuprofen for pain, salt water gargles on a regular basis as this may help soothe the discomfort. 3.  Advised watchful waiting as it will eventually resolve. Final Clinical Impressions(s) / UC Diagnoses   Final diagnoses:  Stomatitis, viral     Discharge Instructions      Advised take ibuprofen or Tylenol for pain relief from the viral ulcer. Advise use salt water gargles on a regular basis to the viral ulcer. The viral ulcer should heal over the course of about 10 to 14 days give or take several days. Follow-up with PCP or return if symptoms fail to improve.     ED Prescriptions   None    PDMP not reviewed this encounter.   Nyoka Lint, PA-C 10/11/21 1728

## 2021-10-11 NOTE — Discharge Instructions (Signed)
Advised take ibuprofen or Tylenol for pain relief from the viral ulcer. Advise use salt water gargles on a regular basis to the viral ulcer. The viral ulcer should heal over the course of about 10 to 14 days give or take several days. Follow-up with PCP or return if symptoms fail to improve.

## 2021-10-11 NOTE — ED Triage Notes (Signed)
Pt reports about 2 weeks having gum swelling to left gums and now pain.

## 2021-10-12 ENCOUNTER — Other Ambulatory Visit: Payer: Self-pay | Admitting: Nurse Practitioner

## 2021-10-12 DIAGNOSIS — Z1231 Encounter for screening mammogram for malignant neoplasm of breast: Secondary | ICD-10-CM

## 2021-10-13 NOTE — Progress Notes (Signed)
51 y.o. G64P1021 Married Black or [place] American Not Hispanic or Latino female here for annual exam.  She is still having irregular periods.  In the last year her cycle has varied from every 16 days to 2.5 months. One x in the last year she had a 16 day cycle, mostly mostly 24-60 days. Bleeds x 4-7 days. She can saturate a pad in 2-3 hours. No cramps. Sexually active, without penetration, husband with ED.   She gets warm at night, no sweats, no hot flashes. She has noticed vaginal dryness, sleep disturbances and some mood changes.   She had a ct scan in July of this year and she thinks her fibroid is larger.   No bowel or bladder c/o. Occasionally notices a bulge at the opening of her vagina. Lasts noticed it a few months.    CT 07/30/21: done for blood in her stool.  Reproductive: Large intramural uterine fibroid ventral aspect uterine fundus measuring up to 5.4 cm. Simple appearing 2.6 cm right adnexal cyst. The left ovary is unremarkable. CT images reviewed with the patient. The fibroid is anterior/intramural/subserosal, no intracavitary component.  No concerning findings.    Patient's last menstrual period was 07/08/2021.          Sexually active: Yes.    The current method of family planning is none.    Exercising: No.  The patient has a physically strenuous job, but has no regular exercise apart from work.  Smoker:  no  Health Maintenance: Pap:    08/29/18 negative, negative HPV testing; 07/29/16 WNL 07/10/14 Wnl Hr HPV Neg  History of abnormal Pap:  no MMG:  11/03/20 incomplete 11/17/20 U/S Bi-rads 2 benign  BMD:   n/a Colonoscopy: 06/12/19 f/u 5 years  TDaP:  05/27/14  Gardasil: n/a   reports that she has never smoked. She has never used smokeless tobacco. She reports current alcohol use. She reports that she does not use drugs. 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   Past Medical History:  Diagnosis Date   GERD (gastroesophageal reflux disease) 2018    Hypercholesteremia    Hyperthyroidism    Migraines    with asthma   Slipping rib syndrome     Past Surgical History:  Procedure Laterality Date   BREAST BIOPSY Right 06/26/2014   benign per patient   COLONOSCOPY  08/03/2012   in [place] exam,melanosis   UPPER GASTROINTESTINAL ENDOSCOPY  09/23/2016    Current Outpatient Medications  Medication Sig Dispense Refill   Vitamin D, Ergocalciferol, (DRISDOL) 1.25 MG (50000 UNIT) CAPS capsule Take 1 capsule (50,000 Units total) by mouth every 7 (seven) days. 8 capsule 0   No current facility-administered medications for this visit.  Now on daily vit d.   Family History  Problem Relation Age of Onset   Diabetes Mother    Hypertension Mother    Colon cancer Mother 53   Breast cancer Mother 45   Cancer Mother    Prostate cancer Father    Cancer Father    Colon cancer Maternal Aunt    Colon cancer Maternal Aunt    Stomach cancer Neg Hx    Esophageal cancer Neg Hx    Colon polyps Neg Hx    Rectal cancer Neg Hx     Review of Systems  All other systems reviewed and are negative.   Exam:   BP 110/74   Pulse 72   Ht '5\' 7"'$  (1.702 m)   Wt  164 lb (74.4 kg)   LMP 07/08/2021   SpO2 100%   BMI 25.69 kg/m   Weight change: '@WEIGHTCHANGE'$ @ Height:   Height: '5\' 7"'$  (170.2 cm)  Ht Readings from Last 3 Encounters:  10/21/21 '5\' 7"'$  (1.702 m)  10/20/21 '5\' 7"'$  (1.702 m)  10/05/21 '5\' 7"'$  (1.702 m)    General appearance: alert, cooperative and appears stated age Head: Normocephalic, without obvious abnormality, atraumatic Neck: no adenopathy, supple, symmetrical, trachea midline and thyroid normal to inspection and palpation Lungs: clear to auscultation bilaterally Cardiovascular: regular rate and rhythm Breasts:  in the left breast is a 1 cm, mobile lump at 1 o'clock (known cyst), a 1 cm mobile lump at 4 o'clock, and a 1 cm lump in the right breast at 11 o'clock Abdomen: soft, non-tender; non distended,  no masses,  no  organomegaly Extremities: extremities normal, atraumatic, no cyanosis or edema Skin: Skin color, texture, turgor normal. No rashes or lesions Lymph nodes: Cervical, supraclavicular, and axillary nodes normal. No abnormal inguinal nodes palpated Neurologic: Grossly normal   Pelvic: External genitalia:  no lesions              Urethra:  normal appearing urethra with no masses, tenderness or lesions              Bartholins and Skenes: normal                 Vagina: mildly atrophic appearing vagina with normal color and discharge, no lesions              Cervix: no lesions               Bimanual Exam:  Uterus:  normal size, contour, position, consistency, mobility, non-tender, anteverted              Adnexa: no mass, fullness, tenderness               Rectovaginal: Confirms               Anus:  normal sphincter tone, no lesions  Gae Dry chaperoned for the exam.  1. Well woman exam Discussed breast self exam Discussed calcium and vit D intake No pap this year  2. Family history of breast cancer Discussed referral to genetics to discuss, she will let me know if she wants the referral  3. Family history of colon cancer See above Colonoscopy UTD  4. Perimenopausal - medroxyPROGESTERone (PROVERA) 5 MG tablet; Take one tablet a day for 5 days every 1-2 months if no spontaneous menses.  Dispense: 15 tablet; Refill: 3  5. Irregular menses perimenopausal - medroxyPROGESTERone (PROVERA) 5 MG tablet; Take one tablet a day for 5 days every 1-2 months if no spontaneous menses.  Dispense: 15 tablet; Refill: 3  6. Breast lump on right side at 1 o'clock position Will set up diagnostic imaging  7. Breast lump on right side at 4 o'clock position Will set up diagnostic imaging  8. Breast lump on left side at 11 o'clock position Will set up diagnostic imaging

## 2021-10-19 ENCOUNTER — Ambulatory Visit: Payer: Federal, State, Local not specified - PPO | Admitting: Obstetrics and Gynecology

## 2021-10-20 ENCOUNTER — Ambulatory Visit: Payer: Federal, State, Local not specified - PPO | Admitting: Nurse Practitioner

## 2021-10-20 ENCOUNTER — Encounter: Payer: Self-pay | Admitting: Nurse Practitioner

## 2021-10-20 VITALS — BP 118/80 | HR 75 | Temp 97.0°F | Ht 67.0 in | Wt 164.0 lb

## 2021-10-20 DIAGNOSIS — R519 Headache, unspecified: Secondary | ICD-10-CM

## 2021-10-20 DIAGNOSIS — H6193 Disorder of external ear, unspecified, bilateral: Secondary | ICD-10-CM | POA: Diagnosis not present

## 2021-10-20 NOTE — Progress Notes (Signed)
                Established Patient Visit  Patient: Christina [name]. Cann[place]   DOB: May 09, 1970   51 y.o. Female  MRN: [id number] Visit Date: 10/20/2021  Subjective:    Chief Complaint  Patient presents with   Office Visit    TMJ F/u Says things have been better  Still has concerns about the "crusty things" in her ears   HPI No problem-specific Assessment & Plan notes found for this encounter. Christina [name] reports resolve left facial pain with use of mobic x 1week. She reports appt with dentist for cleaning a few days prior to onset of facial pain. She also had an eval by oral surgeon: no abnormal finding. She denies any sinus congestion or teeth sensitivity. She is still waiting for appt with ENT. Today she is concerned about bumps in ear canals. They because dry and flaky, no change in size, no erythema, no drainage.  Reviewed medical, surgical, and social history today  Medications: Outpatient Medications Prior to Visit  Medication Sig   Vitamin D, Ergocalciferol, (DRISDOL) 1.25 MG (50000 UNIT) CAPS capsule Take 1 capsule (50,000 Units total) by mouth every 7 (seven) days.   [DISCONTINUED] amoxicillin (AMOXIL) 500 MG capsule Take 500 mg by mouth 3 (three) times daily. (Patient not taking: Reported on 10/20/2021)   [DISCONTINUED] cyclobenzaprine (FLEXERIL) 5 MG tablet Take 1 tablet (5 mg total) by mouth at bedtime as needed for muscle spasms. (Patient not taking: Reported on 10/20/2021)   [DISCONTINUED] meloxicam (MOBIC) 7.5 MG tablet Take 1 tablet (7.5 mg total) by mouth daily. For 2 weeks (Patient not taking: Reported on 10/20/2021)   No facility-administered medications prior to visit.   Reviewed past medical and social history.   ROS per HPI above      Objective:  BP 118/80 (BP Location: Right Arm, Patient Position: Sitting, Cuff Size: Small)   Pulse 75   Temp (!) 97 F (36.1 C) (Temporal)   Ht '5\' 7"'$  (1.702 m)   Wt 164 lb (74.4 kg)   LMP 07/08/2021   SpO2 95%   BMI 25.69 kg/m       Physical Exam HENT:     Right Ear: Tympanic membrane, ear canal and external ear normal.     Left Ear: Tympanic membrane, ear canal and external ear normal.     Ears:      Comments: Nodular hyperpigmented lesions at the opening of each ear canal. Cardiovascular:     Rate and Rhythm: Normal rate.     Pulses: Normal pulses.  Pulmonary:     Effort: Pulmonary effort is normal.  Neurological:     Mental Status: She is alert.     No results found for any visits on 10/20/21.    Assessment & Plan:    Problem List Items Addressed This Visit       Other   Facial pain - Primary   Other Visit Diagnoses     Lesion of external ear canal, bilateral         Provided contact info to schedule appt with ENT.  Return if symptoms worsen or fail to improve.     Wilfred Lacy, NP

## 2021-10-20 NOTE — Patient Instructions (Addendum)
Call ENT: Dr. Redmond Baseman [phone].

## 2021-10-21 ENCOUNTER — Ambulatory Visit (INDEPENDENT_AMBULATORY_CARE_PROVIDER_SITE_OTHER): Payer: Federal, State, Local not specified - PPO | Admitting: Obstetrics and Gynecology

## 2021-10-21 ENCOUNTER — Telehealth: Payer: Self-pay

## 2021-10-21 ENCOUNTER — Encounter: Payer: Self-pay | Admitting: Obstetrics and Gynecology

## 2021-10-21 VITALS — BP 110/74 | HR 72 | Ht 67.0 in | Wt 164.0 lb

## 2021-10-21 DIAGNOSIS — Z803 Family history of malignant neoplasm of breast: Secondary | ICD-10-CM | POA: Diagnosis not present

## 2021-10-21 DIAGNOSIS — N6322 Unspecified lump in the left breast, upper inner quadrant: Secondary | ICD-10-CM

## 2021-10-21 DIAGNOSIS — N951 Menopausal and female climacteric states: Secondary | ICD-10-CM

## 2021-10-21 DIAGNOSIS — N926 Irregular menstruation, unspecified: Secondary | ICD-10-CM

## 2021-10-21 DIAGNOSIS — N6312 Unspecified lump in the right breast, upper inner quadrant: Secondary | ICD-10-CM

## 2021-10-21 DIAGNOSIS — N6314 Unspecified lump in the right breast, lower inner quadrant: Secondary | ICD-10-CM

## 2021-10-21 DIAGNOSIS — Z01419 Encounter for gynecological examination (general) (routine) without abnormal findings: Secondary | ICD-10-CM | POA: Diagnosis not present

## 2021-10-21 DIAGNOSIS — Z800 Family history of malignant neoplasm of digestive organs: Secondary | ICD-10-CM | POA: Diagnosis not present

## 2021-10-21 DIAGNOSIS — N63 Unspecified lump in unspecified breast: Secondary | ICD-10-CM

## 2021-10-21 MED ORDER — MEDROXYPROGESTERONE ACETATE 5 MG PO TABS: ORAL_TABLET | ORAL | 3 refills | Status: DC

## 2021-10-21 NOTE — Telephone Encounter (Signed)
  Patient is scheduled at 1:00pm on 11/30/2021 at [facility]. Check in at 12:45pm.  Spoke with patient and informed her of date time.

## 2021-10-21 NOTE — Telephone Encounter (Signed)
Message Received: Today Christina Dom, MD  P [facility] Triage Bilateral breast lumps, please schedule her for diagnostic imaging.  FYI, she has a screening scheduled in a few weeks.  Thanks,  Sharee Pimple

## 2021-11-03 ENCOUNTER — Ambulatory Visit
Admission: RE | Admit: 2021-11-03 | Discharge: 2021-11-03 | Disposition: A | Discharge status: Home / Self Care | Payer: Federal, State, Local not specified - PPO | Source: Ambulatory Visit | Attending: Obstetrics and Gynecology | Admitting: Obstetrics and Gynecology

## 2021-11-03 DIAGNOSIS — N63 Unspecified lump in unspecified breast: Secondary | ICD-10-CM

## 2021-11-03 DIAGNOSIS — N6011 Diffuse cystic mastopathy of right breast: Secondary | ICD-10-CM | POA: Diagnosis not present

## 2021-11-03 DIAGNOSIS — N6012 Diffuse cystic mastopathy of left breast: Secondary | ICD-10-CM | POA: Diagnosis not present

## 2021-11-03 DIAGNOSIS — R92343 Mammographic extreme density, bilateral breasts: Secondary | ICD-10-CM | POA: Diagnosis not present

## 2021-11-04 ENCOUNTER — Ambulatory Visit: Payer: Federal, State, Local not specified - PPO

## 2021-11-05 ENCOUNTER — Telehealth: Payer: Self-pay

## 2021-11-05 NOTE — Telephone Encounter (Signed)
No, nothing changed. One of the lumps that I felt on exam they didn't see on her imaging. It is probably fine, but we always want to make sure these lumps don't grow. If it were to grow we would further evaluate it.

## 2021-11-05 NOTE — Telephone Encounter (Signed)
Spoke with patient and informed her of result note info.  She said she did not understand the recommendation because the radiologist gave her the all clear and told her to return in one year for screening mammo. She questioned did something change since she had the imaging performed and spoke with the radiologist?

## 2021-11-05 NOTE — Telephone Encounter (Signed)
Called patient back and per DPR access note on file I and at her request during our conversation I left detailed message in her voice mail with Dr. Gentry Fitz reply. I asked her to call me back so we can get her scheduled for br check follow up in 3 mos.

## 2021-11-05 NOTE — Telephone Encounter (Signed)
-----   Message from Salvadore Dom, MD sent at 11/03/2021  5:33 PM EDT ----- Please let the patient that her breast cysts explain most but not all of her lumps. She should return for a repeat breast exam in 3 months.

## 2021-11-17 NOTE — Progress Notes (Signed)
[name] [place] Phone: [phone] Subjective:   Christina [name], am serving as a scribe for Dr. Lynne Leader.  I'm seeing this patient by the request  of:  [name], Charlene Brooke, NP  CC: back and neck pain   [id number]  Christina [name] is a 51 y.o. female coming in with complaint of back and neck pain. OMT on 10/05/2021. Patient states that she cleaned her carpets that last two days her lower back is tight. Last couple of weeks her left side neck and shoulder was tight but better today.  Patient was doing carpet cleaning and feels that her SI joint is out of place.  More tightness in the lower back pain she has had in quite some time.  Medications patient has been prescribed: None        Reviewed prior external information including notes and imaging from previsou exam, outside providers and external EMR if available.   As well as notes that were available from care everywhere and other healthcare systems.  Past medical history, social, surgical and family history all reviewed in electronic medical record.  No pertanent information unless stated regarding to the chief complaint.   Past Medical History:  Diagnosis Date   GERD (gastroesophageal reflux disease) 2018   Hypercholesteremia    Hyperthyroidism    Migraines    with asthma   Slipping rib syndrome     Allergies  Allergen Reactions   Topamax [Topiramate]     Pain in legs, SOB, and almost passed out     Review of Systems:  No headache, visual changes, nausea, vomiting, diarrhea, constipation, dizziness, abdominal pain, skin rash, fevers, chills, night sweats, weight loss, swollen lymph nodes, body aches, joint swelling, chest pain, shortness of breath, mood changes. POSITIVE muscle aches  Objective  Blood pressure 100/70, pulse 78, height '5\' 7"'$  (1.702 m), weight 164 lb (74.4 kg), SpO2 97 %.   General: No apparent distress alert and oriented x3  mood and affect normal, dressed appropriately.  HEENT: Pupils equal, extraocular movements intact  Respiratory: Patient's speak in full sentences and does not appear short of breath  Cardiovascular: No lower extremity edema, non tender, no erythema  Low back exam does have some loss of lordosis.  Some tenderness to palpation in the paraspinal musculature.  Patient does have significant tightness more on the left sacroiliac joint.  Patient does have tenderness to palpation noted.  Tightness with FABER test.  Loss of lordosis of the lumbar spine.  Osteopathic findings  C2 flexed rotated and side bent right C6 flexed rotated and side bent left T3 extended rotated and side bent right inhaled rib T8 extended rotated and side bent left L2 flexed rotated and side bent right Sacrum left on left     Assessment and Plan:  SI (sacroiliac) joint dysfunction Chronic problem with some worsening symptoms.  Did respond extremely well to osteopathic manipulation.  We discussed again about hip abductor strengthening as well as hip flexor strengthening.  Discussed fitting radicular symptoms continue to consider further imaging.  Follow-up again in 6 to 8 weeks    Nonallopathic problems  Decision today to treat with OMT was based on Physical Exam  After verbal consent patient was treated with HVLA, ME, FPR techniques in cervical, rib, thoracic, lumbar, and sacral  areas  Patient tolerated the procedure well with improvement in symptoms  Patient given exercises, stretches and lifestyle modifications  See medications in patient  instructions if given  Patient will follow up in 4-8 weeks     The above documentation has been reviewed and is accurate and complete Lyndal Pulley, DO         Note: This dictation was prepared with Dragon dictation along with smaller phrase technology. Any transcriptional errors that result from this process are unintentional.

## 2021-11-18 NOTE — Telephone Encounter (Signed)
Patient called back she received message and would like to schedule 3 month follow up. Message sent to appointments.

## 2021-11-20 NOTE — Telephone Encounter (Signed)
Patient scheduled on 02/01/22 for breast recheck.

## 2021-11-23 ENCOUNTER — Ambulatory Visit: Payer: Federal, State, Local not specified - PPO | Admitting: Family Medicine

## 2021-11-23 VITALS — BP 100/70 | HR 78 | Ht 67.0 in | Wt 164.0 lb

## 2021-11-23 DIAGNOSIS — M9901 Segmental and somatic dysfunction of cervical region: Secondary | ICD-10-CM

## 2021-11-23 DIAGNOSIS — M9903 Segmental and somatic dysfunction of lumbar region: Secondary | ICD-10-CM | POA: Diagnosis not present

## 2021-11-23 DIAGNOSIS — M9902 Segmental and somatic dysfunction of thoracic region: Secondary | ICD-10-CM

## 2021-11-23 DIAGNOSIS — M533 Sacrococcygeal disorders, not elsewhere classified: Secondary | ICD-10-CM

## 2021-11-23 DIAGNOSIS — M9908 Segmental and somatic dysfunction of rib cage: Secondary | ICD-10-CM

## 2021-11-23 DIAGNOSIS — M9904 Segmental and somatic dysfunction of sacral region: Secondary | ICD-10-CM

## 2021-11-23 NOTE — Assessment & Plan Note (Signed)
Chronic problem with some worsening symptoms.  Did respond extremely well to osteopathic manipulation.  We discussed again about hip abductor strengthening as well as hip flexor strengthening.  Discussed fitting radicular symptoms continue to consider further imaging.  Follow-up again in 6 to 8 weeks

## 2021-11-23 NOTE — Patient Instructions (Addendum)
Good to see you  Have husband do the carpets next year See me in 6-8 weeks

## 2021-11-30 ENCOUNTER — Other Ambulatory Visit: Payer: Federal, State, Local not specified - PPO

## 2021-12-14 ENCOUNTER — Encounter: Payer: Self-pay | Admitting: Nurse Practitioner

## 2021-12-14 ENCOUNTER — Ambulatory Visit: Payer: Federal, State, Local not specified - PPO | Admitting: Nurse Practitioner

## 2021-12-14 VITALS — BP 112/70 | HR 77 | Temp 97.4°F | Ht 67.0 in | Wt 162.4 lb

## 2021-12-14 DIAGNOSIS — E559 Vitamin D deficiency, unspecified: Secondary | ICD-10-CM

## 2021-12-14 DIAGNOSIS — E0590 Thyrotoxicosis, unspecified without thyrotoxic crisis or storm: Secondary | ICD-10-CM

## 2021-12-14 DIAGNOSIS — E7800 Pure hypercholesterolemia, unspecified: Secondary | ICD-10-CM | POA: Diagnosis not present

## 2021-12-14 DIAGNOSIS — Z0001 Encounter for general adult medical examination with abnormal findings: Secondary | ICD-10-CM

## 2021-12-14 NOTE — Progress Notes (Signed)
Complete physical exam  Patient: Christina [name]. Christina [name]   DOB: Apr 22, 1970   51 y.o. Female  MRN: [id number] Visit Date: 12/14/2021  Subjective:    Chief Complaint  Patient presents with   Annual Exam    Cpe Pt fasting  No concerns  Thyroid levels  Requesting records for PAP   Christina [name] is a 51 y.o. female who presents today for a complete physical exam. She reports consuming a general diet.  No specific exercise regimen.  She generally feels well. She reports sleeping well. She does not have additional problems to discuss today.  Vision:Yes Dental:Yes STD Screen:No  BP Readings from Last 3 Encounters:  12/14/21 112/70  11/23/21 100/70  10/21/21 110/74    Wt Readings from Last 3 Encounters:  12/14/21 162 lb 6.4 oz (73.7 kg)  11/23/21 164 lb (74.4 kg)  10/21/21 164 lb (74.4 kg)    Most recent fall risk assessment:    12/25/2020    9:05 AM  [place] in the past year? 0  Number falls in past yr: 0  Injury with Fall? 0  Risk for fall due to : No Fall Risks  Follow up Falls evaluation completed   Most recent depression screenings:    12/14/2021    2:32 PM 12/09/2020    1:25 PM  PHQ 2/9 Scores  PHQ - 2 Score 0 0  PHQ- 9 Score 0    HPI  Hyperthyroidism Stable weight, no palpitation, no hair loss, no mood swing or diaphoresis. Repeat TSH and T4  Hypercholesterolemia Repeat lipid panel  Vitamin D deficiency Completed high dose Repeat vit. D  Past Medical History:  Diagnosis Date   GERD (gastroesophageal reflux disease) 2018   Hypercholesteremia    Hyperthyroidism    Migraines    with asthma   Slipping rib syndrome    Past Surgical History:  Procedure Laterality Date   BREAST BIOPSY Right 06/26/2014   benign per patient   COLONOSCOPY  08/03/2012   in [place] exam,melanosis   UPPER GASTROINTESTINAL ENDOSCOPY  09/23/2016   Social History   Socioeconomic History   Marital status: Married    Spouse name: Not on file    Number of children: 1   Years of education: 14   Highest education level: Not on file  Occupational History   Occupation: Geologist, engineering  Tobacco Use   Smoking status: Never   Smokeless tobacco: Never  Vaping Use   Vaping Use: Never used  Substance and Sexual Activity   Alcohol use: Yes   Drug use: No   Sexual activity: Yes    Partners: Male    Birth control/protection: Condom  Other Topics Concern   Not on file  Social History Narrative   Born and raised in [place].    Currently resides in a house with her child. No pets. Fun: Go to the movies.    Denies religious beliefs effecting health care.    Social Determinants of Health   Financial Resource Strain: Not on file  Food Insecurity: Not on file  Transportation Needs: Not on file  Physical Activity: Not on file  Stress: Not on file  Social Connections: Not on file  Intimate Partner Violence: Not on file   Family Status  Relation Name Status   Mother deceased Deceased at age 15   Father deceased Deceased   Mat Aunt  (Not Specified)   Mat Aunt  (Not Specified)   Cousin  (Not Specified)  Cousin  (Not Specified)   Neg Hx  (Not Specified)   Family History  Problem Relation Age of Onset   Diabetes Mother    Hypertension Mother    Colon cancer Mother 47   Breast cancer Mother 54   Cancer Mother    Prostate cancer Father    Cancer Father    Colon cancer Maternal Aunt    Colon cancer Maternal Aunt    Breast cancer Cousin    Breast cancer Cousin    Stomach cancer Neg Hx    Esophageal cancer Neg Hx    Colon polyps Neg Hx    Rectal cancer Neg Hx    Allergies  Allergen Reactions   Topamax [Topiramate]     Pain in legs, SOB, and almost passed out    Patient Care Team: Caylee Vlachos, Charlene Brooke, NP as PCP - General (Internal Medicine) Salvadore Dom, MD as Consulting Physician (Obstetrics and Gynecology)   Medications: Outpatient Medications Prior to Visit  Medication Sig   Vitamin D, Ergocalciferol,  (DRISDOL) 1.25 MG (50000 UNIT) CAPS capsule Take 1 capsule (50,000 Units total) by mouth every 7 (seven) days.   [DISCONTINUED] medroxyPROGESTERone (PROVERA) 5 MG tablet Take one tablet a day for 5 days every 1-2 months if no spontaneous menses. (Patient not taking: Reported on 12/14/2021)   No facility-administered medications prior to visit.    Review of Systems  Constitutional:  Negative for fever.  HENT:  Negative for congestion and sore throat.   Eyes:        Negative for visual changes  Respiratory:  Negative for cough and shortness of breath.   Cardiovascular:  Negative for chest pain, palpitations and leg swelling.  Gastrointestinal:  Negative for blood in stool, constipation and diarrhea.  Genitourinary:  Negative for dysuria, frequency and urgency.  Musculoskeletal:  Negative for myalgias.  Skin:  Negative for rash.  Neurological:  Negative for dizziness and headaches.  Hematological:  Does not bruise/bleed easily.  Psychiatric/Behavioral:  Negative for suicidal ideas. The patient is not nervous/anxious.         Objective:  BP 112/70 (BP Location: Right Arm, Patient Position: Sitting, Cuff Size: Normal)   Pulse 77   Temp (!) 97.4 F (36.3 C) (Temporal)   Ht '5\' 7"'$  (1.702 m)   Wt 162 lb 6.4 oz (73.7 kg)   SpO2 96%   BMI 25.44 kg/m      Physical Exam Vitals and nursing note reviewed.  Constitutional:      General: She is not in acute distress. HENT:     Right Ear: Tympanic membrane, ear canal and external ear normal.     Left Ear: Tympanic membrane, ear canal and external ear normal.     Nose: Nose normal.  Eyes:     General: No scleral icterus.    Extraocular Movements: Extraocular movements intact.     Conjunctiva/sclera: Conjunctivae normal.  Cardiovascular:     Rate and Rhythm: Normal rate and regular rhythm.     Pulses: Normal pulses.     Heart sounds: Normal heart sounds.  Pulmonary:     Effort: Pulmonary effort is normal. No respiratory distress.      Breath sounds: Normal breath sounds.  Abdominal:     General: Bowel sounds are normal. There is no distension.     Palpations: Abdomen is soft.  Genitourinary:    Comments: Deferred breat and pelvic exam to GYN Musculoskeletal:        General: Normal range of motion.  Cervical back: Normal range of motion and neck supple.  Lymphadenopathy:     Cervical: No cervical adenopathy.  Skin:    General: Skin is warm and dry.  Neurological:     Mental Status: She is alert and oriented to person, place, and time.  Psychiatric:        Mood and Affect: Mood normal.        Behavior: Behavior normal.        Thought Content: Thought content normal.      No results found for any visits on 12/14/21.    Assessment & Plan:    Routine Health Maintenance and Physical Exam  Immunization History  Administered Date(s) Administered   Tdap 05/27/2014    Health Maintenance  Topic Date Due   Zoster Vaccines- Shingrix (1 of 2) 01/19/2022 (Originally 07/10/1988)   INFLUENZA VACCINE  04/25/2022 (Originally 08/25/2021)   Hepatitis C Screening  12/15/2022 (Originally 07/11/1987)   HIV Screening  12/15/2022 (Originally 07/10/1984)   MAMMOGRAM  11/04/2022   PAP SMEAR-Modifier  08/29/2023   COLONOSCOPY (Pts 45-63yr Insurance coverage will need to be confirmed)  06/11/2024   HPV VACCINES  Aged Out    Discussed health benefits of physical activity, and encouraged her to engage in regular exercise appropriate for her age and condition.  Problem List Items Addressed This Visit       Endocrine   Hyperthyroidism    Stable weight, no palpitation, no hair loss, no mood swing or diaphoresis. Repeat TSH and T4      Relevant Orders   TSH   T4, free     Other   Hypercholesterolemia    Repeat lipid panel      Relevant Orders   Lipid panel   Vitamin D deficiency    Completed high dose Repeat vit. D      Relevant Orders   Vitamin D (25 hydroxy)   Other Visit Diagnoses     Encounter for  preventative adult health care exam with abnormal findings    -  Primary   Relevant Orders   Comprehensive metabolic panel      Return in about 1 year (around 12/15/2022) for CPE (fasting).     CWilfred Lacy NP

## 2021-12-14 NOTE — Assessment & Plan Note (Signed)
Repeat lipid panel ?

## 2021-12-14 NOTE — Assessment & Plan Note (Signed)
Completed high dose Repeat vit. D

## 2021-12-14 NOTE — Patient Instructions (Signed)
Go to lab Start low impact exercise: 144mns per week.  Preventive Care [age]Years Old, Female Preventive care refers to lifestyle choices and visits with your health care provider that can promote health and wellness. Preventive care visits are also called wellness exams. What can I expect for my preventive care visit? Counseling Your health care provider may ask you questions about your: Medical history, including: Past medical problems. Family medical history. Pregnancy history. Current health, including: Menstrual cycle. Method of birth control. Emotional well-being. Home life and relationship well-being. Sexual activity and sexual health. Lifestyle, including: Alcohol, nicotine or tobacco, and drug use. Access to firearms. Diet, exercise, and sleep habits. Work and work eStatistician Sunscreen use. Safety issues such as seatbelt and bike helmet use. Physical exam Your health care provider will check your: Height and weight. These may be used to calculate your BMI (body mass index). BMI is a measurement that tells if you are at a healthy weight. Waist circumference. This measures the distance around your waistline. This measurement also tells if you are at a healthy weight and may help predict your risk of certain diseases, such as type 2 diabetes and high blood pressure. Heart rate and blood pressure. Body temperature. Skin for abnormal spots. What immunizations do I need?  Vaccines are usually given at various ages, according to a schedule. Your health care provider will recommend vaccines for you based on your age, medical history, and lifestyle or other factors, such as travel or where you work. What tests do I need? Screening Your health care provider may recommend screening tests for certain conditions. This may include: Lipid and cholesterol levels. Diabetes screening. This is done by checking your blood sugar (glucose) after you have not eaten for a while  (fasting). Pelvic exam and Pap test. Hepatitis B test. Hepatitis C test. HIV (human immunodeficiency virus) test. STI (sexually transmitted infection) testing, if you are at risk. Lung cancer screening. Colorectal cancer screening. Mammogram. Talk with your health care provider about when you should start having regular mammograms. This may depend on whether you have a family history of breast cancer. BRCA-related cancer screening. This may be done if you have a family history of breast, ovarian, tubal, or peritoneal cancers. Bone density scan. This is done to screen for osteoporosis. Talk with your health care provider about your test results, treatment options, and if necessary, the need for more tests. Follow these instructions at home: Eating and drinking  Eat a diet that includes fresh fruits and vegetables, whole grains, lean protein, and low-fat dairy products. Take vitamin and mineral supplements as recommended by your health care provider. Do not drink alcohol if: Your health care provider tells you not to drink. You are pregnant, may be pregnant, or are planning to become pregnant. If you drink alcohol: Limit how much you have to 0-1 drink a day. Know how much alcohol is in your drink. In the [place] one drink equals one 12 oz bottle of beer (355 mL), one 5 oz glass of wine (148 mL), or one 1 oz glass of hard liquor (44 mL). Lifestyle Brush your teeth every morning and night with fluoride toothpaste. Floss one time each day. Exercise for at least 30 minutes 5 or more days each week. Do not use any products that contain nicotine or tobacco. These products include cigarettes, chewing tobacco, and vaping devices, such as e-cigarettes. If you need help quitting, ask your health care provider. Do not use drugs. If you are sexually active, practice  safe sex. Use a condom or other form of protection to prevent STIs. If you do not wish to become pregnant, use a form of birth control. If  you plan to become pregnant, see your health care provider for a prepregnancy visit. Take aspirin only as told by your health care provider. Make sure that you understand how much to take and what form to take. Work with your health care provider to find out whether it is safe and beneficial for you to take aspirin daily. Find healthy ways to manage stress, such as: Meditation, yoga, or listening to music. Journaling. Talking to a trusted person. Spending time with friends and family. Minimize exposure to UV radiation to reduce your risk of skin cancer. Safety Always wear your seat belt while driving or riding in a vehicle. Do not drive: If you have been drinking alcohol. Do not ride with someone who has been drinking. When you are tired or distracted. While texting. If you have been using any mind-altering substances or drugs. Wear a helmet and other protective equipment during sports activities. If you have firearms in your house, make sure you follow all gun safety procedures. Seek help if you have been physically or sexually abused. What's next? Visit your health care provider once a year for an annual wellness visit. Ask your health care provider how often you should have your eyes and teeth checked. Stay up to date on all vaccines. This information is not intended to replace advice given to you by your health care provider. Make sure you discuss any questions you have with your health care provider. Document Revised: 07/09/2020 Document Reviewed: 07/09/2020 Elsevier Patient Education  [place].

## 2021-12-14 NOTE — Assessment & Plan Note (Signed)
Stable weight, no palpitation, no hair loss, no mood swing or diaphoresis. Repeat TSH and T4

## 2021-12-15 ENCOUNTER — Telehealth: Payer: Self-pay

## 2021-12-15 LAB — COMPREHENSIVE METABOLIC PANEL
ALT: 12 U/L (ref 0–35)
AST: 18 U/L (ref 0–37)
Albumin: 4.1 g/dL (ref 3.5–5.2)
Alkaline Phosphatase: 38 U/L — ABNORMAL LOW (ref 39–117)
BUN: 15 mg/dL (ref 6–23)
CO2: 28 mEq/L (ref 19–32)
Calcium: 8.7 mg/dL (ref 8.4–10.5)
Chloride: 104 mEq/L (ref 96–112)
Creatinine, Ser: 1 mg/dL (ref 0.40–1.20)
GFR: 64.81 mL/min (ref >60.00)
Glucose, Bld: 56 mg/dL — ABNORMAL LOW (ref 70–99)
Potassium: 9.6 mEq/L (ref 3.5–5.1)
Sodium: 132 mEq/L — ABNORMAL LOW (ref 135–145)
Total Bilirubin: 0.6 mg/dL (ref 0.2–1.2)
Total Protein: 6.9 g/dL (ref 6.0–8.3)

## 2021-12-15 LAB — LIPID PANEL
Cholesterol: 148 mg/dL (ref 0–200)
HDL: 35.5 mg/dL — ABNORMAL LOW (ref >39.00)
LDL Cholesterol: 101 mg/dL — ABNORMAL HIGH (ref 0–99)
NonHDL: 112.12
Total CHOL/HDL Ratio: 4
Triglycerides: 56 mg/dL (ref 0.0–149.0)
VLDL: 11.2 mg/dL (ref 0.0–40.0)

## 2021-12-15 LAB — T4, FREE: Free T4: 0.74 ng/dL (ref 0.60–1.60)

## 2021-12-15 LAB — VITAMIN D 25 HYDROXY (VIT D DEFICIENCY, FRACTURES): VITD: 30.62 ng/mL (ref 30.00–100.00)

## 2021-12-15 LAB — TSH: TSH: 1.92 u[IU]/mL (ref 0.35–5.50)

## 2021-12-15 NOTE — Telephone Encounter (Signed)
Called & left pt a detailed VM asking to call us back to schedule a lab appt to have her potassium drawn.

## 2021-12-15 NOTE — Telephone Encounter (Signed)
[facility]  Caller: Janalyn Shy Lab tech   Receiver: Hoke Baer L. CMA/CPT   Time: 11:45 am   Critical Value: high potassium 9.6      Potassium was collected in the evening. Lab was allowed to clot ,spun down, then refrigerated. per protocol. Was sent out this this morning previous potassium read at 3.9.  Would you prefer pt to come back in for recollection of potassium? Please advise.

## 2021-12-16 ENCOUNTER — Other Ambulatory Visit (INDEPENDENT_AMBULATORY_CARE_PROVIDER_SITE_OTHER): Payer: Federal, State, Local not specified - PPO

## 2021-12-16 DIAGNOSIS — E875 Hyperkalemia: Secondary | ICD-10-CM

## 2021-12-16 LAB — POTASSIUM: Potassium: 3.8 mEq/L (ref 3.5–5.1)

## 2022-01-06 NOTE — Progress Notes (Signed)
[place] Phone: [phone] Subjective:   [name], am serving as a scribe for Dr. Hulan Saas.  I'm seeing this patient by the request  of:  [name], Charlene Brooke, NP  CC: Back and neck pain will follow-up  [id number]  Christina [name] is a 51 y.o. female coming in with complaint of back and neck pain. OMT 11/23/2021. Patient states that she has been doing well since last visit.   Medications patient has been prescribed: None  Taking:         Reviewed prior external information including notes and imaging from previsou exam, outside providers and external EMR if available.   As well as notes that were available from care everywhere and other healthcare systems.  Past medical history, social, surgical and family history all reviewed in electronic medical record.  No pertanent information unless stated regarding to the chief complaint.   Past Medical History:  Diagnosis Date   GERD (gastroesophageal reflux disease) 2018   Hypercholesteremia    Hyperthyroidism    Migraines    with asthma   Slipping rib syndrome     Allergies  Allergen Reactions   Topamax [Topiramate]     Pain in legs, SOB, and almost passed out     Review of Systems:  No headache, visual changes, nausea, vomiting, diarrhea, constipation, dizziness, abdominal pain, skin rash, fevers, chills, night sweats, weight loss, swollen lymph nodes, body aches, joint swelling, chest pain, shortness of breath, mood changes. POSITIVE muscle aches  Objective  Blood pressure 110/72, pulse 78, height '5\' 7"'$  (1.702 m), weight 160 lb (72.6 kg), SpO2 98 %.   General: No apparent distress alert and oriented x3 mood and affect normal, dressed appropriately.  HEENT: Pupils equal, extraocular movements intact  Respiratory: Patient's speak in full sentences and does not appear short of breath  Cardiovascular: No lower extremity edema, non  tender, no erythema  Low back exam does have some loss of doses.  Patient does have tightness noted around the left sacroiliac joint.  The patient also has some tightness in the left parascapular area.  Neck does have some limited sidebending with lacking of the last 5 degrees of extension.  Osteopathic findings  C2 flexed rotated and side bent right C6 flexed rotated and side bent left T3 extended rotated and side bent left inhaled rib T9 extended rotated and side bent left inhaled rib L2 flexed rotated and side bent right Sacrum left on left       Assessment and Plan:  Left lumbar radiculopathy Tightness noted, discussed hep  Discussed with patient about continue to work on core strengthening.  I do believe when patient does significant activities at work seems to have more difficulty but at the moment seems to be doing okay.  Patient did state that she is looking at potential retirement in the next 5 years.  Follow-up with me again in 6 to 8 weeks otherwise.    Nonallopathic problems  Decision today to treat with OMT was based on Physical Exam  After verbal consent patient was treated with HVLA, ME, FPR techniques in cervical, rib, thoracic, lumbar, and sacral  areas  Patient tolerated the procedure well with improvement in symptoms  Patient given exercises, stretches and lifestyle modifications  See medications in patient instructions if given  Patient will follow up in 4-8 weeks     The above documentation has been reviewed and is accurate and complete  Lyndal Pulley, DO         Note: This dictation was prepared with Dragon dictation along with smaller phrase technology. Any transcriptional errors that result from this process are unintentional.

## 2022-01-11 ENCOUNTER — Ambulatory Visit: Payer: Federal, State, Local not specified - PPO | Admitting: Obstetrics and Gynecology

## 2022-01-11 ENCOUNTER — Ambulatory Visit: Payer: Federal, State, Local not specified - PPO | Admitting: Family Medicine

## 2022-01-11 VITALS — BP 110/72 | HR 78 | Ht 67.0 in | Wt 160.0 lb

## 2022-01-11 DIAGNOSIS — M5416 Radiculopathy, lumbar region: Secondary | ICD-10-CM | POA: Diagnosis not present

## 2022-01-11 DIAGNOSIS — M9908 Segmental and somatic dysfunction of rib cage: Secondary | ICD-10-CM | POA: Diagnosis not present

## 2022-01-11 DIAGNOSIS — M9903 Segmental and somatic dysfunction of lumbar region: Secondary | ICD-10-CM | POA: Diagnosis not present

## 2022-01-11 DIAGNOSIS — M9901 Segmental and somatic dysfunction of cervical region: Secondary | ICD-10-CM

## 2022-01-11 DIAGNOSIS — M9904 Segmental and somatic dysfunction of sacral region: Secondary | ICD-10-CM

## 2022-01-11 DIAGNOSIS — M9902 Segmental and somatic dysfunction of thoracic region: Secondary | ICD-10-CM

## 2022-01-11 NOTE — Patient Instructions (Signed)
Happy Holidays  See me in 6 weeks

## 2022-01-11 NOTE — Assessment & Plan Note (Signed)
Tightness noted, discussed hep  Discussed with patient about continue to work on core strengthening.  I do believe when patient does significant activities at work seems to have more difficulty but at the moment seems to be doing okay.  Patient did state that she is looking at potential retirement in the next 5 years.  Follow-up with me again in 6 to 8 weeks otherwise.

## 2022-02-01 ENCOUNTER — Ambulatory Visit: Payer: Federal, State, Local not specified - PPO | Admitting: Obstetrics and Gynecology

## 2022-02-06 DIAGNOSIS — K0889 Other specified disorders of teeth and supporting structures: Secondary | ICD-10-CM | POA: Diagnosis not present

## 2022-02-09 ENCOUNTER — Ambulatory Visit: Payer: Federal, State, Local not specified - PPO | Admitting: Obstetrics and Gynecology

## 2022-02-09 ENCOUNTER — Encounter: Payer: Self-pay | Admitting: Obstetrics and Gynecology

## 2022-02-09 VITALS — BP 120/80

## 2022-02-09 DIAGNOSIS — N951 Menopausal and female climacteric states: Secondary | ICD-10-CM

## 2022-02-09 DIAGNOSIS — N6002 Solitary cyst of left breast: Secondary | ICD-10-CM | POA: Diagnosis not present

## 2022-02-09 DIAGNOSIS — N6001 Solitary cyst of right breast: Secondary | ICD-10-CM | POA: Diagnosis not present

## 2022-02-09 DIAGNOSIS — N926 Irregular menstruation, unspecified: Secondary | ICD-10-CM | POA: Diagnosis not present

## 2022-02-09 NOTE — Patient Instructions (Signed)
Perimenopause Perimenopause is the normal time of a woman's life when the levels of estrogen, the female hormone produced by the ovaries, begin to decrease. This leads to changes in menstrual periods before they stop completely (menopause). Perimenopause can begin 2-8 years before menopause. During perimenopause, the ovaries may or may not produce an egg and a woman can still become pregnant. What are the causes? This condition is caused by a natural change in hormone levels that happens as you get older. What increases the risk? This condition is more likely to start at an earlier age if you have certain medical conditions or have undergone treatments, including: A tumor of the pituitary gland in the brain. A disease that affects the ovaries and hormone production. Certain cancer treatments, such as chemotherapy or hormone therapy, or radiation therapy on the pelvis. Heavy smoking and excessive alcohol use. Family history of early menopause. What are the signs or symptoms? Perimenopausal changes affect each woman differently. Symptoms of this condition may include: Hot flashes. Irregular menstrual periods. Night sweats. Changes in feelings about sex. This could be a decrease in sex drive or an increased discomfort around your sexuality. Vaginal dryness. Headaches. Mood swings. Depression. Problems sleeping (insomnia). Memory problems or trouble concentrating. Irritability. Tiredness. Weight gain. Anxiety. Trouble getting pregnant. How is this diagnosed? This condition is diagnosed based on your medical history, a physical exam, your age, your menstrual history, and your symptoms. Hormone tests may also be done. How is this treated? In some cases, no treatment is needed. You and your health care provider should make a decision together about whether treatment is necessary. Treatment will be based on your individual condition and preferences. Various treatments are available, such  as: Menopausal hormone therapy (MHT). Medicines to treat specific symptoms. Acupuncture. Vitamin or herbal supplements. Before starting treatment, make sure to let your health care provider know if you have a personal or family history of: Heart disease. Breast cancer. Blood clots. Diabetes. Osteoporosis. Follow these instructions at home: Medicines Take over-the-counter and prescription medicines only as told by your health care provider. Take vitamin supplements only as told by your health care provider. Talk with your health care provider before starting any herbal supplements. Lifestyle  Do not use any products that contain nicotine or tobacco, such as cigarettes, e-cigarettes, and chewing tobacco. If you need help quitting, ask your health care provider. Get at least 30 minutes of physical activity on 5 or more days each week. Eat a balanced diet that includes fresh fruits and vegetables, whole grains, soybeans, eggs, lean meat, and low-fat dairy. Avoid alcoholic and caffeinated beverages, as well as spicy foods. This may help prevent hot flashes. Get 7-8 hours of sleep each night. Dress in layers that can be removed to help you manage hot flashes. Find ways to manage stress, such as deep breathing, meditation, or journaling. General instructions  Keep track of your menstrual periods, including: When they occur. How heavy they are and how long they last. How much time passes between periods. Keep track of your symptoms, noting when they start, how often you have them, and how long they last. Use vaginal lubricants or moisturizers to help with vaginal dryness and improve comfort during sex. You can still become pregnant if you are having irregular periods. Make sure you use contraception during perimenopause if you do not want to get pregnant. Keep all follow-up visits. This is important. This includes any group therapy or counseling. Contact a health care provider if: You  have  heavy vaginal bleeding or pass blood clots. Your period lasts more than 2 days longer than normal. Your periods are recurring sooner than 21 days. You bleed after having sex. You have pain during sex. Get help right away if you have: Chest pain, trouble breathing, or trouble talking. Severe depression. Pain when you urinate. Severe headaches. Vision problems. Summary Perimenopause is the time when a woman's body begins to move into menopause. This may happen naturally or as a result of other health problems or medical treatments. Perimenopause can begin 2-8 years before menopause, and it can last for several years. Perimenopausal symptoms can be managed through medicines, lifestyle changes, and complementary therapies such as acupuncture. This information is not intended to replace advice given to you by your health care provider. Make sure you discuss any questions you have with your health care provider. Document Revised: 06/28/2019 Document Reviewed: 06/28/2019 Elsevier Patient Education  [employer].

## 2022-02-09 NOTE — Progress Notes (Signed)
GYNECOLOGY  VISIT   HPI: 52 y.o.   Married Black or [place] American Not Hispanic or Latino  female   [id number] with Patient's last menstrual period was 01/05/2022.   here for a f/u breast exam.  At the time of her annual exam in 9/23 she was noted to have:  in the left breast is a 1 cm, mobile lump at 1 o'clock (known cyst), a 1 cm mobile lump at 4 o'clock, and a 1 cm lump in the right breast at 11 o'clock.   Targeted ultrasound is performed, showing scattered simple and minimally complicated cysts throughout the bilateral breasts including a 6 mm cyst at the 11 o'clock position 4 cm from the nipple on the right, a 1.2 cm cyst at the 12 o'clock position 3 cm from the nipple on the right and a 1.1 cm cyst at the 1 o'clock position 2 cm from the nipple on the left. These all correspond with the mammographic findings. No suspicious sonographic findings at the site of the physician palpated lumps.  She reports irregular menstrual cycles, had a cycle in 6/12, then not again until 11/23.  She is c/o hot flashes many times a day, gets hot at night but no sweats. Feels tired.   GYNECOLOGIC HISTORY: Patient's last menstrual period was 01/05/2022. 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   Nonallopathic lesion of lumbosacral region 08/17/2017   Neck pain 03/18/2017   Hypercholesterolemia 03/08/2017   Left lumbar radiculopathy 09/07/2016   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   Nonallopathic lesion of sacral region 05/27/2016   Low back pain 12/31/2015   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   Nonallopathic lesion of thoracic region 07/12/2014   Nonallopathic lesion-rib cage 07/12/2014   Nonallopathic lesion of cervical region 07/12/2014   Family history of breast cancer 07/10/2014    Class: Family History of   Rib pain on right side 06/21/2014   Migraine 04/03/2014   Hyperthyroidism 12/26/2013   Vitamin D deficiency 04/26/2013    Past Medical History:  Diagnosis Date   GERD (gastroesophageal reflux disease) 2018   Hypercholesteremia    Hyperthyroidism    Migraines    with asthma   Slipping rib syndrome     Past Surgical History:  Procedure Laterality Date   BREAST BIOPSY Right 06/26/2014   benign per patient   COLONOSCOPY  08/03/2012   in [place]  09/23/2016    Current Outpatient Medications  Medication Sig Dispense Refill   VITAMIN D PO Take 1 tablet by mouth daily.     No current facility-administered medications for this visit.     ALLERGIES: Topamax [topiramate]  Family History  Problem Relation Age of Onset   Diabetes Mother    Hypertension Mother    Colon cancer Mother 74   Breast cancer Mother 41   Cancer Mother    Prostate cancer Father    Cancer Father    Colon cancer Maternal Aunt    Colon cancer Maternal Aunt    Breast cancer Cousin    Breast cancer Cousin    Stomach cancer Neg Hx    Esophageal cancer Neg Hx    Colon polyps Neg Hx    Rectal cancer Neg Hx     Social History   Socioeconomic History   Marital status: Married    Spouse name: Not on file   Number of children: 1   Years of education: 14   Highest education level: Not on file  Occupational History   Occupation: Geologist, engineering  Tobacco Use   Smoking status: Never   Smokeless tobacco: Never  Vaping Use   Vaping Use: Never used  Substance and Sexual Activity    Alcohol use: Yes   Drug use: No   Sexual activity: Yes    Partners: Male    Birth control/protection: Condom  Other Topics Concern   Not on file  Social History Narrative   Born and raised in [place].    Currently resides in a house with her child. No pets. Fun: Go to the movies.    Denies religious beliefs effecting health care.    Social Determinants of Health   Financial Resource Strain: Not on file  Food Insecurity: Not on file  Transportation Needs: Not on file  Physical Activity: Not on file  Stress: Not on file  Social Connections: Not on file  Intimate Partner Violence: Not on file    Review of Systems  All other systems reviewed and are negative.   PHYSICAL EXAMINATION:    BP 120/80 (BP Location: Right Arm, Patient Position: Sitting, Cuff Size: Normal)   LMP 01/05/2022     General appearance: alert, cooperative and appears stated age Breasts:  in the left breast is a <1 cm lump at 1 o'clock, a <1 cm lump at 4 o'clock. In the right breast is a <1 cm lump at 11 o'clock. All lumps feel slightly smaller than at her visit in 9/23.  No axillary or supraclavicular adenopathy.   1. Bilateral breast cysts Stable exam, imaging benign and UTD  2. Irregular menses Discussed irregular menses and perimenopause. Recommended she call if she goes 2-3 months without a cycle. Discussed the option of cyclic progesterone  3. Perimenopausal vasomotor symptoms Discussed behavioral changes and avoiding triggers -Can try estroven or estroven pm to help with vasomotor symptoms -Reach out if her symptoms worsen

## 2022-02-23 NOTE — Progress Notes (Unsigned)
  [facility] [place] Phone: [phone] Subjective:   [name], am serving as a scribe for Dr. Hulan Saas. I'm seeing this patient by the request  of:  [name], Charlene Brooke, NP  CC: Neck and back pain  [id number]  Dakoda D. Bayle is a 52 y.o. female coming in with complaint of back and neck pain. OMT on 01/11/2022. Patient states that she has been doing well since last visit.  Does not know exactly why this change but overall doing well.  Medications patient has been prescribed: None          Reviewed prior external information including notes and imaging from previsou exam, outside providers and external EMR if available.   As well as notes that were available from care everywhere and other healthcare systems.  Past medical history, social, surgical and family history all reviewed in electronic medical record.  No pertanent information unless stated regarding to the chief complaint.   Past Medical History:  Diagnosis Date   GERD (gastroesophageal reflux disease) 2018   Hypercholesteremia    Hyperthyroidism    Migraines    with asthma   Slipping rib syndrome     Allergies  Allergen Reactions   Topamax [Topiramate]     Pain in legs, SOB, and almost passed out     Review of Systems:  No headache, visual changes, nausea, vomiting, diarrhea, constipation, dizziness, abdominal pain, skin rash, fevers, chills, night sweats, weight loss, swollen lymph nodes, body aches, joint swelling, chest pain, shortness of breath, mood changes. POSITIVE muscle aches  Objective  Blood pressure 110/80, pulse (!) 58, height '5\' 7"'$  (1.702 m), weight 165 lb (74.8 kg), last menstrual period 01/05/2022.   General: No apparent distress alert and oriented x3 mood and affect normal, dressed appropriately.  HEENT: Pupils equal, extraocular movements intact  Respiratory: Patient's speak in full sentences and does not  appear short of breath  Cardiovascular: No lower extremity edema, non tender, no erythema  Does have some mild loss of lordosis.  Osteopathic findings  C3 flexed rotated and side bent right C6 flexed rotated and side bent left T3 extended rotated and side bent right inhaled rib T8 extended rotated and side bent left L2 flexed rotated and side bent right Sacrum right on right     Assessment and Plan:  Low back pain Low back doing well.  Discussed HEP  Doing well with no changes  RTC in 6-8 weeks     Nonallopathic problems  Decision today to treat with OMT was based on Physical Exam  After verbal consent patient was treated with HVLA, ME, FPR techniques in cervical, rib, thoracic, lumbar, and sacral  areas  Patient tolerated the procedure well with improvement in symptoms  Patient given exercises, stretches and lifestyle modifications  See medications in patient instructions if given  Patient will follow up in 4-8 weeks     The above documentation has been reviewed and is accurate and complete Lyndal Pulley, DO         Note: This dictation was prepared with Dragon dictation along with smaller phrase technology. Any transcriptional errors that result from this process are unintentional.

## 2022-02-25 ENCOUNTER — Ambulatory Visit (INDEPENDENT_AMBULATORY_CARE_PROVIDER_SITE_OTHER): Payer: Federal, State, Local not specified - PPO | Admitting: Family Medicine

## 2022-02-25 VITALS — BP 110/80 | HR 58 | Ht 67.0 in | Wt 165.0 lb

## 2022-02-25 DIAGNOSIS — M9903 Segmental and somatic dysfunction of lumbar region: Secondary | ICD-10-CM

## 2022-02-25 DIAGNOSIS — M9902 Segmental and somatic dysfunction of thoracic region: Secondary | ICD-10-CM

## 2022-02-25 DIAGNOSIS — M9901 Segmental and somatic dysfunction of cervical region: Secondary | ICD-10-CM

## 2022-02-25 DIAGNOSIS — M9908 Segmental and somatic dysfunction of rib cage: Secondary | ICD-10-CM | POA: Diagnosis not present

## 2022-02-25 DIAGNOSIS — M9904 Segmental and somatic dysfunction of sacral region: Secondary | ICD-10-CM | POA: Diagnosis not present

## 2022-02-25 DIAGNOSIS — G8929 Other chronic pain: Secondary | ICD-10-CM

## 2022-02-25 DIAGNOSIS — M5442 Lumbago with sciatica, left side: Secondary | ICD-10-CM | POA: Diagnosis not present

## 2022-02-25 NOTE — Patient Instructions (Signed)
Good to see you Watch out for conspiracy theories See me in 6-8 weeks

## 2022-02-25 NOTE — Assessment & Plan Note (Signed)
Low back doing well.  Discussed HEP  Doing well with no changes  RTC in 6-8 weeks

## 2022-02-25 NOTE — Assessment & Plan Note (Signed)
>>  ASSESSMENT AND PLAN FOR LOW BACK PAIN WRITTEN ON 02/25/2022 12:50 PM BY Antoine Primas M, DO  Low back doing well.  Discussed HEP  Doing well with no changes  RTC in 6-8 weeks

## 2022-04-02 NOTE — Progress Notes (Unsigned)
[facility] [place] Phone: [phone] Subjective:   Christina Christina [name], am serving as a scribe for Dr. Hulan Saas.  I'm seeing this patient by the request  of:  [name], Christina Brooke, NP  CC: Back pain follow-up  [id number]  Christina Christina [name] is a 52 y.o. female coming in with complaint of back and neck pain. OMT 02/25/2022. Patient states that over past 2 weeks she has had increase in lower back and R hip pain. Pain over GT. Christina [name] new injury. Denies any radiating symptoms.   Medications patient has been prescribed: None  Taking:         Reviewed prior external information including notes and imaging from previsou exam, outside providers and external EMR if available.   As well as notes that were available from care everywhere and other healthcare systems.  Past medical history, social, surgical and family history all reviewed in electronic medical record.  Christina [name] pertanent information unless stated regarding to the chief complaint.   Past Medical History:  Diagnosis Date   GERD (gastroesophageal reflux disease) 2018   Hypercholesteremia    Hyperthyroidism    Migraines    with asthma   Slipping rib syndrome     Allergies  Allergen Reactions   Topamax [Topiramate]     Pain in legs, SOB, and almost passed out     Review of Systems:  Christina [name] headache, visual changes, nausea, vomiting, diarrhea, constipation, dizziness, abdominal pain, skin rash, fevers, chills, night sweats, weight loss, swollen lymph nodes, body aches, joint swelling, chest pain, shortness of breath, mood changes. POSITIVE muscle aches  Objective  Blood pressure 110/76, pulse 98, height '5\' 7"'$  (1.702 m), weight 168 lb (76.2 kg), SpO2 98 %.   General: Christina [name] apparent distress alert and oriented x3 mood and affect normal, dressed appropriately.  HEENT: Pupils equal, extraocular movements intact  Respiratory: Patient's speak in full sentences and does not  appear short of breath  Cardiovascular: Christina [name] lower extremity edema, non tender, Christina [name] erythema  Severe increase in muscle tightness is noted today.  Tender over the sacroiliac joint bilaterally.  The patient does have tightness with FABER.  Osteopathic findings  C2 flexed rotated and side bent right C7 flexed rotated and side bent left T3 extended rotated and side bent right inhaled rib T9 extended rotated and side bent left L2 flexed rotated and side bent right Sacrum right on right       Assessment and Plan:  Low back pain Exacerbation of low back noted today.  I do think that it is secondary to this as well as the sacroiliac joint.  Discussed posture and ergonomics.  Discussed home exercises.  Toradol and Depo-Medrol given today.  Discussed the gabapentin and muscle relaxer at night.  Follow-up again in 6 to 8 weeks see me sooner if any radicular symptoms occur.    Nonallopathic problems  Decision today to treat with OMT was based on Physical Exam  After verbal consent patient was treated with HVLA, ME, FPR techniques in cervical, rib, thoracic, lumbar, and sacral  areas  Patient tolerated the procedure well with improvement in symptoms  Patient given exercises, stretches and lifestyle modifications  See medications in patient instructions if given  Patient will follow up in 4-8 weeks     The above documentation has been reviewed and is accurate and complete Lyndal Pulley, DO         Note: This dictation was prepared with  Dragon dictation along with smaller Company secretary. Any transcriptional errors that result from this process are unintentional.

## 2022-04-05 ENCOUNTER — Ambulatory Visit (INDEPENDENT_AMBULATORY_CARE_PROVIDER_SITE_OTHER): Payer: Federal, State, Local not specified - PPO | Admitting: Family Medicine

## 2022-04-05 VITALS — BP 110/76 | HR 98 | Ht 67.0 in | Wt 168.0 lb

## 2022-04-05 DIAGNOSIS — M9901 Segmental and somatic dysfunction of cervical region: Secondary | ICD-10-CM

## 2022-04-05 DIAGNOSIS — M9902 Segmental and somatic dysfunction of thoracic region: Secondary | ICD-10-CM

## 2022-04-05 DIAGNOSIS — M9904 Segmental and somatic dysfunction of sacral region: Secondary | ICD-10-CM

## 2022-04-05 DIAGNOSIS — M9908 Segmental and somatic dysfunction of rib cage: Secondary | ICD-10-CM | POA: Diagnosis not present

## 2022-04-05 DIAGNOSIS — M5442 Lumbago with sciatica, left side: Secondary | ICD-10-CM | POA: Diagnosis not present

## 2022-04-05 DIAGNOSIS — G8929 Other chronic pain: Secondary | ICD-10-CM | POA: Diagnosis not present

## 2022-04-05 DIAGNOSIS — M9903 Segmental and somatic dysfunction of lumbar region: Secondary | ICD-10-CM | POA: Diagnosis not present

## 2022-04-05 MED ORDER — TIZANIDINE HCL 4 MG PO TABS: 4.0000 mg | ORAL_TABLET | Freq: Every day | ORAL | 0 refills | Status: DC

## 2022-04-05 MED ORDER — KETOROLAC TROMETHAMINE 30 MG/ML IJ SOLN
30.0000 mg | Freq: Once | INTRAMUSCULAR | Status: AC
  Administered 2022-04-05: 30 mg via INTRAMUSCULAR

## 2022-04-05 MED ORDER — METHYLPREDNISOLONE ACETATE 40 MG/ML IJ SUSP
40.0000 mg | Freq: Once | INTRAMUSCULAR | Status: AC
  Administered 2022-04-05: 40 mg via INTRAMUSCULAR

## 2022-04-05 NOTE — Assessment & Plan Note (Signed)
Exacerbation of low back noted today.  I do think that it is secondary to this as well as the sacroiliac joint.  Discussed posture and ergonomics.  Discussed home exercises.  Toradol and Depo-Medrol given today.  Discussed the gabapentin and muscle relaxer at night.  Follow-up again in 6 to 8 weeks see me sooner if any radicular symptoms occur.

## 2022-04-05 NOTE — Patient Instructions (Addendum)
Zanaflex 4 mg Injections in backside today See me again in 5-6 weeks

## 2022-04-05 NOTE — Assessment & Plan Note (Signed)
>>  ASSESSMENT AND PLAN FOR LOW BACK PAIN WRITTEN ON 04/05/2022  2:41 PM BY Antoine Primas M, DO  Exacerbation of low back noted today.  I do think that it is secondary to this as well as the sacroiliac joint.  Discussed posture and ergonomics.  Discussed home exercises.  Toradol and Depo-Medrol given today.  Discussed the gabapentin and muscle relaxer at night.  Follow-up again in 6 to 8 weeks see me sooner if any radicular symptoms occur.

## 2022-05-13 NOTE — Progress Notes (Signed)
[facility] [address] [place] [address] Phone: [phone] Subjective:   Bruce Donath, am serving as a scribe for Dr. Antoine Primas.  I'm seeing this patient by the request  of:  [name], Bonna Gains, NP  CC: Back and neck pain follow-up  [id number]  Christina [name] is a 52 y.o. female coming in with complaint of back and neck pain. OMT on 04/05/2022. Patient states that she is doing better since last visit. Feels hips are tight and has been stretching hip flexor and groin. Had to do 2 rounds of zanaflex.   Medications patient has been prescribed: zanaflex  Taking:         Reviewed prior external information including notes and imaging from previsou exam, outside providers and external EMR if available.   As well as notes that were available from care everywhere and other healthcare systems.  Past medical history, social, surgical and family history all reviewed in electronic medical record.  No pertanent information unless stated regarding to the chief complaint.   Past Medical History:  Diagnosis Date   GERD (gastroesophageal reflux disease) 2018   Hypercholesteremia    Hyperthyroidism    Migraines    with asthma   Slipping rib syndrome     Allergies  Allergen Reactions   Topamax [Topiramate]     Pain in legs, SOB, and almost passed out     Review of Systems:  No headache, visual changes, nausea, vomiting, diarrhea, constipation, dizziness, abdominal pain, skin rash, fevers, chills, night sweats, weight loss, swollen lymph nodes, body aches, joint swelling, chest pain, shortness of breath, mood changes. POSITIVE muscle aches  Objective  Blood pressure 104/76, pulse 74, height  (1.702 m), weight 166 lb (75.3 kg), SpO2 98 %.   General: No apparent distress alert and oriented x3 mood and affect normal, dressed appropriately.  HEENT: Pupils equal, extraocular movements intact  Respiratory: Patient's speak in  full sentences and does not appear short of breath  Cardiovascular: No lower extremity edema, non tender, no erythema  Low back not quite as tight.  Still though has more tightness with FABER test on the right than the left.  Negative straight leg test noted today which was somewhat of an improvement.  Osteopathic findings  C2 flexed rotated and side bent right C7 flexed rotated and side bent left T3 extended rotated and side bent right inhaled rib T8 extended rotated and side bent left L2 flexed rotated and side bent right Sacrum right on right       Assessment and Plan:  SI (sacroiliac) joint dysfunction Improvement from last exam.  Not as much tightness.  Discussed icing regimen and home exercises, discussed increasing activity, home exercises, which activities to do and which ones to avoid.  Increase activity slowly.  Follow-up again in 6 to 8 weeks    Nonallopathic problems  Decision today to treat with OMT was based on Physical Exam  After verbal consent patient was treated with HVLA, ME, FPR techniques in cervical, rib, thoracic, lumbar, and sacral  areas  Patient tolerated the procedure well with improvement in symptoms  Patient given exercises, stretches and lifestyle modifications  See medications in patient instructions if given  Patient will follow up in 4-8 weeks    The above documentation has been reviewed and is accurate and complete Judi Saa, DO          Note: This dictation was prepared with Dragon dictation along with  smaller phrase technology. Any transcriptional errors that result from this process are unintentional.

## 2022-05-17 ENCOUNTER — Ambulatory Visit (INDEPENDENT_AMBULATORY_CARE_PROVIDER_SITE_OTHER): Payer: Federal, State, Local not specified - PPO | Admitting: Family Medicine

## 2022-05-17 ENCOUNTER — Encounter: Payer: Self-pay | Admitting: Family Medicine

## 2022-05-17 VITALS — BP 104/76 | HR 74 | Ht 67.0 in | Wt 166.0 lb

## 2022-05-17 DIAGNOSIS — M9902 Segmental and somatic dysfunction of thoracic region: Secondary | ICD-10-CM | POA: Diagnosis not present

## 2022-05-17 DIAGNOSIS — M9901 Segmental and somatic dysfunction of cervical region: Secondary | ICD-10-CM

## 2022-05-17 DIAGNOSIS — M9903 Segmental and somatic dysfunction of lumbar region: Secondary | ICD-10-CM

## 2022-05-17 DIAGNOSIS — M9904 Segmental and somatic dysfunction of sacral region: Secondary | ICD-10-CM

## 2022-05-17 DIAGNOSIS — M9908 Segmental and somatic dysfunction of rib cage: Secondary | ICD-10-CM | POA: Diagnosis not present

## 2022-05-17 DIAGNOSIS — M533 Sacrococcygeal disorders, not elsewhere classified: Secondary | ICD-10-CM

## 2022-05-17 NOTE — Assessment & Plan Note (Signed)
Improvement from last exam.  Not as much tightness.  Discussed icing regimen and home exercises, discussed increasing activity, home exercises, which activities to do and which ones to avoid.  Increase activity slowly.  Follow-up again in 6 to 8 weeks

## 2022-05-17 NOTE — Patient Instructions (Signed)
Thanks for relationship advice See me in 6 weeks

## 2022-06-24 NOTE — Progress Notes (Deleted)
  [facility] [address] [place] [address] Phone: [phone] Subjective:    I'm seeing this patient by the request  of:  [name], Bonna Gains, NP  CC:   [id number]  Christina [name] is a 52 y.o. female coming in with complaint of back and neck pain. OMT on 05/17/2022. Patient states   Medications patient has been prescribed:   Taking:         Reviewed prior external information including notes and imaging from previsou exam, outside providers and external EMR if available.   As well as notes that were available from care everywhere and other healthcare systems.  Past medical history, social, surgical and family history all reviewed in electronic medical record.  No pertanent information unless stated regarding to the chief complaint.   Past Medical History:  Diagnosis Date   GERD (gastroesophageal reflux disease) 2018   Hypercholesteremia    Hyperthyroidism    Migraines    with asthma   Slipping rib syndrome     Allergies  Allergen Reactions   Topamax [Topiramate]     Pain in legs, SOB, and almost passed out     Review of Systems:  No headache, visual changes, nausea, vomiting, diarrhea, constipation, dizziness, abdominal pain, skin rash, fevers, chills, night sweats, weight loss, swollen lymph nodes, body aches, joint swelling, chest pain, shortness of breath, mood changes. POSITIVE muscle aches  Objective  There were no vitals taken for this visit.   General: No apparent distress alert and oriented x3 mood and affect normal, dressed appropriately.  HEENT: Pupils equal, extraocular movements intact  Respiratory: Patient's speak in full sentences and does not appear short of breath  Cardiovascular: No lower extremity edema, non tender, no erythema  Gait MSK:  Back   Osteopathic findings  C2 flexed rotated and side bent right C6 flexed rotated and side bent left T3 extended rotated and side bent right inhaled  rib T9 extended rotated and side bent left L2 flexed rotated and side bent right Sacrum right on right       Assessment and Plan:  No problem-specific Assessment & Plan notes found for this encounter.    Nonallopathic problems  Decision today to treat with OMT was based on Physical Exam  After verbal consent patient was treated with HVLA, ME, FPR techniques in cervical, rib, thoracic, lumbar, and sacral  areas  Patient tolerated the procedure well with improvement in symptoms  Patient given exercises, stretches and lifestyle modifications  See medications in patient instructions if given  Patient will follow up in 4-8 weeks             Note: This dictation was prepared with Dragon dictation along with smaller phrase technology. Any transcriptional errors that result from this process are unintentional.

## 2022-06-27 ENCOUNTER — Emergency Department (HOSPITAL_COMMUNITY)
Admission: EM | Admit: 2022-06-27 | Discharge: 2022-06-28 | Disposition: A | Discharge status: Home / Self Care | Payer: Federal, State, Local not specified - PPO | Attending: Emergency Medicine | Admitting: Emergency Medicine

## 2022-06-27 ENCOUNTER — Encounter (HOSPITAL_COMMUNITY): Payer: Self-pay

## 2022-06-27 ENCOUNTER — Other Ambulatory Visit: Payer: Self-pay

## 2022-06-27 DIAGNOSIS — K219 Gastro-esophageal reflux disease without esophagitis: Secondary | ICD-10-CM | POA: Diagnosis not present

## 2022-06-27 DIAGNOSIS — R1031 Right lower quadrant pain: Secondary | ICD-10-CM | POA: Diagnosis not present

## 2022-06-27 DIAGNOSIS — K529 Noninfective gastroenteritis and colitis, unspecified: Secondary | ICD-10-CM | POA: Insufficient documentation

## 2022-06-27 DIAGNOSIS — N281 Cyst of kidney, acquired: Secondary | ICD-10-CM | POA: Diagnosis not present

## 2022-06-27 DIAGNOSIS — R9431 Abnormal electrocardiogram [ECG] [EKG]: Secondary | ICD-10-CM | POA: Diagnosis not present

## 2022-06-27 MED ORDER — ONDANSETRON HCL 4 MG/2ML IJ SOLN
4.0000 mg | Freq: Once | INTRAMUSCULAR | Status: AC
  Administered 2022-06-28: 4 mg via INTRAVENOUS
  Filled 2022-06-27: qty 2

## 2022-06-27 MED ORDER — SODIUM CHLORIDE 0.9 % IV BOLUS (SEPSIS)
1000.0000 mL | Freq: Once | INTRAVENOUS | Status: AC
  Administered 2022-06-28: 1000 mL via INTRAVENOUS

## 2022-06-27 NOTE — ED Triage Notes (Signed)
Pt arrives c/o abd pain x1 week, worsened tonight. States she's also had n/v/d today. Denies blood in emesis or stools. Denies any meds prior to arrival.

## 2022-06-28 ENCOUNTER — Telehealth: Payer: Self-pay

## 2022-06-28 ENCOUNTER — Emergency Department (HOSPITAL_COMMUNITY): Payer: Federal, State, Local not specified - PPO

## 2022-06-28 ENCOUNTER — Ambulatory Visit: Payer: Federal, State, Local not specified - PPO | Admitting: Family Medicine

## 2022-06-28 DIAGNOSIS — K529 Noninfective gastroenteritis and colitis, unspecified: Secondary | ICD-10-CM | POA: Diagnosis not present

## 2022-06-28 DIAGNOSIS — N281 Cyst of kidney, acquired: Secondary | ICD-10-CM | POA: Diagnosis not present

## 2022-06-28 LAB — COMPREHENSIVE METABOLIC PANEL
ALT: 16 U/L (ref 0–44)
AST: 17 U/L (ref 15–41)
Albumin: 3.9 g/dL (ref 3.5–5.0)
Alkaline Phosphatase: 39 U/L (ref 38–126)
Anion gap: 7 (ref 5–15)
BUN: 14 mg/dL (ref 6–20)
CO2: 25 mmol/L (ref 22–32)
Calcium: 8.6 mg/dL — ABNORMAL LOW (ref 8.9–10.3)
Chloride: 104 mmol/L (ref 98–111)
Creatinine, Ser: 1.02 mg/dL — ABNORMAL HIGH (ref 0.44–1.00)
GFR, Estimated: >60 mL/min (ref >60)
Glucose, Bld: 101 mg/dL — ABNORMAL HIGH (ref 70–99)
Potassium: 3.7 mmol/L (ref 3.5–5.1)
Sodium: 136 mmol/L (ref 135–145)
Total Bilirubin: 0.3 mg/dL (ref 0.3–1.2)
Total Protein: 7.2 g/dL (ref 6.5–8.1)

## 2022-06-28 LAB — CBC WITH DIFFERENTIAL/PLATELET
Abs Immature Granulocytes: 0.01 10*3/uL (ref 0.00–0.07)
Basophils Absolute: 0 10*3/uL (ref 0.0–0.1)
Basophils Relative: 0 %
Eosinophils Absolute: 0 10*3/uL (ref 0.0–0.5)
Eosinophils Relative: 0 %
HCT: 41 % (ref 36.0–46.0)
Hemoglobin: 13.3 g/dL (ref 12.0–15.0)
Immature Granulocytes: 0 %
Lymphocytes Relative: 15 %
Lymphs Abs: 1 10*3/uL (ref 0.7–4.0)
MCH: 28 pg (ref 26.0–34.0)
MCHC: 32.4 g/dL (ref 30.0–36.0)
MCV: 86.3 fL (ref 80.0–100.0)
Monocytes Absolute: 0.6 10*3/uL (ref 0.1–1.0)
Monocytes Relative: 9 %
Neutro Abs: 4.8 10*3/uL (ref 1.7–7.7)
Neutrophils Relative %: 76 %
Platelets: 195 10*3/uL (ref 150–400)
RBC: 4.75 MIL/uL (ref 3.87–5.11)
RDW: 12.3 % (ref 11.5–15.5)
WBC: 6.4 10*3/uL (ref 4.0–10.5)
nRBC: 0 % (ref 0.0–0.2)

## 2022-06-28 LAB — LIPASE, BLOOD: Lipase: 40 U/L (ref 11–51)

## 2022-06-28 LAB — HCG, QUANTITATIVE, PREGNANCY: hCG, Beta Chain, Quant, S: <1 m[IU]/mL (ref <5)

## 2022-06-28 MED ORDER — ONDANSETRON 8 MG PO TBDP: 8.0000 mg | ORAL_TABLET | Freq: Three times a day (TID) | ORAL | 0 refills | Status: DC | PRN

## 2022-06-28 MED ORDER — AMOXICILLIN-POT CLAVULANATE 875-125 MG PO TABS
1.0000 | ORAL_TABLET | Freq: Once | ORAL | Status: AC
  Administered 2022-06-28: 1 via ORAL
  Filled 2022-06-28: qty 1

## 2022-06-28 MED ORDER — IOHEXOL 300 MG/ML  SOLN
100.0000 mL | Freq: Once | INTRAMUSCULAR | Status: AC | PRN
  Administered 2022-06-28: 100 mL via INTRAVENOUS

## 2022-06-28 MED ORDER — AMOXICILLIN-POT CLAVULANATE 875-125 MG PO TABS: 1.0000 | ORAL_TABLET | Freq: Two times a day (BID) | ORAL | 0 refills | Status: DC

## 2022-06-28 NOTE — Transitions of Care (Post Inpatient/ED Visit) (Signed)
   06/28/2022  Name: Christina [name] MRN: [id number] DOB: March 15, 1970  Today's TOC FU Call Status: Today's TOC FU Call Status:: Successful TOC FU Call Competed TOC FU Call Complete Date: 06/28/22  Transition Care Management Follow-up Telephone Call Date of Discharge: 06/28/22 Discharge Facility: [facility]) Type of Discharge: Emergency Department How have you been since you were released from the hospital?: Better Any questions or concerns?: No  Items Reviewed: Did you receive and understand the discharge instructions provided?: Yes Medications obtained,verified, and reconciled?: Yes (Medications Reviewed) Any new allergies since your discharge?: No Dietary orders reviewed?: Yes Do you have support at home?: Yes  Medications Reviewed Today: Medications Reviewed Today     Reviewed by Larey Dresser, RN (Registered Nurse) on 06/28/22 at 1348  Med List Status: <None>   Medication Order Taking? Sig Documenting Provider Last Dose Status Informant  amoxicillin-clavulanate (AUGMENTIN) 875-125 MG tablet [id number] Yes Take 1 tablet by mouth every 12 (twelve) hours. Zadie Rhine, MD Taking Active   ondansetron (ZOFRAN-ODT) 8 MG disintegrating tablet [id number] Yes Take 1 tablet (8 mg total) by mouth every 8 (eight) hours as needed. Zadie Rhine, MD Taking Active   tiZANidine (ZANAFLEX) 4 MG tablet [id number] Yes Take 1 tablet (4 mg total) by mouth at bedtime. Judi Saa, DO Taking Active   VITAMIN D PO [id number] Yes Take 1 tablet by mouth daily. [provider] Taking Active             Home Care and Equipment/Supplies: Were Home Health Services Ordered?: NA Any new equipment or medical supplies ordered?: NA  Functional Questionnaire: Do you need assistance with bathing/showering or dressing?: No Do you need assistance with meal preparation?: No Do you need assistance with eating?: No Do you have difficulty maintaining continence: No Do you need  assistance with getting out of bed/getting out of a chair/moving?: No Do you have difficulty managing or taking your medications?: No  Follow up appointments reviewed: PCP Follow-up appointment confirmed?: NA Specialist Hospital Follow-up appointment confirmed?: NA Do you need transportation to your follow-up appointment?: No Do you understand care options if your condition(s) worsen?: Yes-patient verbalized understanding    SIGNATURE Arvil Persons, BSN, RN

## 2022-06-28 NOTE — Telephone Encounter (Signed)
Christina [name] called in the office and she wanted to know if she can take pain medication for colitis.  She was in the ED 06/27/22, diagnosed with Colitis and was given Augmentin and Zofran. Today she started to feel some pain and wanted  to know if she can take pain medication.   I asked Calton Golds, NP and she said the patient can take tylenol or Ibuprofen for the pain.  I let the patient know this and she expressed understanding.

## 2022-06-28 NOTE — ED Provider Notes (Signed)
EMERGENCY DEPARTMENT AT [facility] Provider Note   CSN: [id number] Arrival date & time: 06/27/22  2229     History  Chief Complaint  Patient presents with   Abdominal Pain    Christina [name] is a 52 y.o. female.  The history is provided by the patient.  Patient with history of hyperlipidemia presents with abdominal pain, vomiting diarrhea.  Patient reports for the past week she has had generalized abdominal pain, but over the past day it has been in the right lower quadrant.  Over the past that she has had multiple episodes of nausea vomiting diarrhea that were nonbloody.  No previous abdominal surgery.  No dysuria. Husband came home from work and found her in pain and brought her in for concern for appendicitis  No recent travel.  No known sick contacts    Past Medical History:  Diagnosis Date   GERD (gastroesophageal reflux disease) 2018   Hypercholesteremia    Hyperthyroidism    Migraines    with asthma   Slipping rib syndrome     Home Medications Prior to Admission medications   Medication Sig Start Date End Date Taking? Authorizing Provider  amoxicillin-clavulanate (AUGMENTIN) 875-125 MG tablet Take 1 tablet by mouth every 12 (twelve) hours. 06/28/22  Yes Zadie Rhine, MD  ondansetron (ZOFRAN-ODT) 8 MG disintegrating tablet Take 1 tablet (8 mg total) by mouth every 8 (eight) hours as needed. 06/28/22  Yes Zadie Rhine, MD  tiZANidine (ZANAFLEX) 4 MG tablet Take 1 tablet (4 mg total) by mouth at bedtime. 04/05/22   Judi Saa, DO  VITAMIN D PO Take 1 tablet by mouth daily.    [provider]      Allergies    Topamax [topiramate]    Review of Systems   Review of Systems  Constitutional:  Negative for fever.  Gastrointestinal:  Positive for vomiting.    Physical Exam Updated Vital Signs BP 116/80   Pulse 70   Temp 98.1 F (36.7 C) (Oral)   Resp 16   Ht 1.702 m (5\' 7" )   Wt 74.8 kg   LMP 05/12/2022 (Approximate)    SpO2 100%   BMI 25.84 kg/m  Physical Exam CONSTITUTIONAL: Well developed/well nourished HEAD: Normocephalic/atraumatic EYES: EOMI/PERRL, +scleral icterus ENMT: Mucous membranes moist NECK: supple no meningeal signs CV: S1/S2 noted, no murmurs/rubs/gallops noted LUNGS: Lungs are clear to auscultation bilaterally, no apparent distress ABDOMEN: soft, mild RLQ tenderness, no rebound or guarding, bowel sounds noted throughout abdomen GU:no cva tenderness NEURO: Pt is awake/alert/appropriate, moves all extremitiesx4.  No facial droop.   EXTREMITIES: pulses normal/equal SKIN: warm, color normal PSYCH: no abnormalities of mood noted, alert and oriented to situation  ED Results / Procedures / Treatments   Labs (all labs ordered are listed, but only abnormal results are displayed) Labs Reviewed  COMPREHENSIVE METABOLIC PANEL - Abnormal; Notable for the following components:      Result Value   Glucose, Bld 101 (*)    Creatinine, Ser 1.02 (*)    Calcium 8.6 (*)    All other components within normal limits  LIPASE, BLOOD  CBC WITH DIFFERENTIAL/PLATELET  HCG, QUANTITATIVE, PREGNANCY    EKG EKG Interpretation  Date/Time:  Monday June 28 2022 00:22:24 EDT Ventricular Rate:  64 PR Interval:  164 QRS Duration: 93 QT Interval:  441 QTC Calculation: 455 R Axis:   62 Text Interpretation: Sinus rhythm Confirmed by Zadie Rhine ([id number]) on 06/28/2022 12:26:01 AM  Radiology CT ABDOMEN PELVIS  W CONTRAST  Result Date: 06/28/2022 CLINICAL DATA:  Right lower quadrant abdominal pain. EXAM: CT ABDOMEN AND PELVIS WITH CONTRAST TECHNIQUE: Multidetector CT imaging of the abdomen and pelvis was performed using the standard protocol following bolus administration of intravenous contrast. RADIATION DOSE REDUCTION: This exam was performed according to the departmental dose-optimization program which includes automated exposure control, adjustment of the mA and/or kV according to patient size and/or use  of iterative reconstruction technique. CONTRAST:  OMNIPAQUE IOHEXOL 300 MG/ML  SOLN COMPARISON:  07/30/2021 FINDINGS: Lower chest: Clear lung bases. Hepatobiliary: No focal liver abnormality is seen. No gallstones, gallbladder wall thickening, or biliary dilatation. Pancreas: Unremarkable. No pancreatic ductal dilatation or surrounding inflammatory changes. Spleen: Normal in size without focal abnormality. Adrenals/Urinary Tract: Normal adrenal glands. No hydronephrosis or perinephric edema. Small cyst in the left kidney, needs no further imaging follow-up. There is excreted IV contrast on initial imaging due to mechanical issue with building loosing power. Normal appearance of the urinary bladder. Stomach/Bowel: Normal appendix. There is colonic wall thickening with pericolonic edema extending from the mid transverse colon through the sigmoid typical of colitis. No bowel pneumatosis, perforation or abscess. There is no small bowel obstruction or inflammation. Unremarkable appearance of the stomach. Vascular/Lymphatic: Normal caliber abdominal aorta. No acute vascular findings. No abdominopelvic adenopathy. Reproductive: Partially exophytic anterior fundal fibroid measures 6 cm. No adnexal mass. Other: Small amount of free fluid in the pelvis is likely reactive. No free air or focal fluid collection. Musculoskeletal: There are no acute or suspicious osseous abnormalities. IMPRESSION: 1. Colitis extending from the mid transverse colon through the sigmoid colon, likely infectious or inflammatory. 2. Uterine fibroid. Electronically Signed   By: Narda Rutherford M.D.   On: 06/28/2022 01:40    Procedures Procedures    Medications Ordered in ED Medications  amoxicillin-clavulanate (AUGMENTIN) 875-125 MG per tablet 1 tablet (has no administration in time range)  ondansetron (ZOFRAN) injection 4 mg (4 mg Intravenous Given 06/28/22 0013)  sodium chloride 0.9 % bolus 1,000 mL (1,000 mLs Intravenous New Bag/Given  06/28/22 0011)  iohexol (OMNIPAQUE) 300 MG/ML solution 100 mL (100 mLs Intravenous Contrast Given 06/28/22 0129)    ED Course/ Medical Decision Making/ A&P Clinical Course as of 06/28/22 0211  Mon Jun 28, 2022  0110 Patient reports nausea improved, will proceed with CT imaging [DW]  0210 CT imaging reveals colitis.  No other acute findings.  Patient feels improved. She reports she gets regular colonoscopies without any issues.  Last 1 was about 2-3 years ago. Will place on oral antibiotics, and if no improvement in 7 to 10 days she can follow-up with PCP or her GI specialist  Discussed at length with patient and her husband about strict ER return precautions [DW]    Clinical Course User Index [DW] Zadie Rhine, MD                             Medical Decision Making Amount and/or Complexity of Data Reviewed Labs: ordered. Radiology: ordered. ECG/medicine tests: ordered.  Risk Prescription drug management.   This patient presents to the ED for concern of abdominal pain, this involves an extensive number of treatment options, and is a complaint that carries with it a high risk of complications and morbidity.  The differential diagnosis includes but is not limited to cholecystitis, cholelithiasis, pancreatitis, gastritis, peptic ulcer disease, appendicitis, bowel obstruction, bowel perforation, diverticulitis, AAA, ischemic bowel   Comorbidities that complicate the patient  evaluation: Patient's presentation is complicated by their history of hyperlipidemia  Additional history obtained: Additional history obtained from spouse Records reviewed  outpatient records reviewed  Lab Tests: I Ordered, and personally interpreted labs.  The pertinent results include: Labs unremarkable  Imaging Studies ordered: I ordered imaging studies including CT scan abdomen pelvis   I independently visualized and interpreted imaging which showed colitis I agree with the radiologist  interpretation  Reevaluation: After the interventions noted above, I reevaluated the patient and found that they have :improved  Complexity of problems addressed: Patient's presentation is most consistent with  acute presentation with potential threat to life or bodily function  Disposition: After consideration of the diagnostic results and the patient's response to treatment,  I feel that the patent would benefit from discharge   .           Final Clinical Impression(s) / ED Diagnoses Final diagnoses:  Colitis    Rx / DC Orders ED Discharge Orders          Ordered    amoxicillin-clavulanate (AUGMENTIN) 875-125 MG tablet  Every 12 hours        06/28/22 0209    ondansetron (ZOFRAN-ODT) 8 MG disintegrating tablet  Every 8 hours PRN        06/28/22 0209              Zadie Rhine, MD 06/28/22 [id number]

## 2022-09-06 ENCOUNTER — Encounter: Payer: Self-pay | Admitting: Nurse Practitioner

## 2022-09-06 ENCOUNTER — Ambulatory Visit: Payer: Federal, State, Local not specified - PPO | Admitting: Nurse Practitioner

## 2022-09-06 VITALS — BP 110/76 | HR 69 | Temp 98.6°F | Resp 16 | Ht 67.0 in | Wt 161.8 lb

## 2022-09-06 DIAGNOSIS — H6123 Impacted cerumen, bilateral: Secondary | ICD-10-CM

## 2022-09-06 DIAGNOSIS — J0140 Acute pansinusitis, unspecified: Secondary | ICD-10-CM

## 2022-09-06 MED ORDER — METHYLPREDNISOLONE 4 MG PO TBPK
ORAL_TABLET | ORAL | 0 refills | Status: DC
Start: 2022-09-06 — End: 2022-10-07

## 2022-09-06 MED ORDER — AZITHROMYCIN 250 MG PO TABS
250.0000 mg | ORAL_TABLET | Freq: Every day | ORAL | 0 refills | Status: DC
Start: 2022-09-06 — End: 2022-10-07

## 2022-09-06 NOTE — Progress Notes (Signed)
Established Patient Visit  Patient: Christina [name]. Forst   DOB: Jun 14, 1970   52 y.o. Female  MRN: [id number] Visit Date: 09/06/2022  Subjective:    Chief Complaint  Patient presents with   Headache    Dizziness, fatigue and headache.  Dizziness started in May and rest of sxs started in a month   Dizziness This is a recurrent problem. The current episode started more than 1 month ago. The problem occurs daily. The problem has been waxing and waning. Associated symptoms include congestion, fatigue, headaches and vertigo. Pertinent negatives include no abdominal pain, anorexia, arthralgias, change in bowel habit, chest pain, chills, coughing, diaphoresis, fever, joint swelling, myalgias, nausea, neck pain, numbness, rash, sore throat, swollen glands, urinary symptoms, visual change, vomiting or weakness. Associated symptoms comments: No tinnitus. Exacerbated by: prolonged screen time. She has tried acetaminophen for the symptoms. The treatment provided mild relief.   Reviewed medical, surgical, and social history today  Medications: Outpatient Medications Prior to Visit  Medication Sig   tiZANidine (ZANAFLEX) 4 MG tablet Take 1 tablet (4 mg total) by mouth at bedtime.   VITAMIN D PO Take 1 tablet by mouth daily.   [DISCONTINUED] amoxicillin-clavulanate (AUGMENTIN) 875-125 MG tablet Take 1 tablet by mouth every 12 (twelve) hours. (Patient not taking: Reported on 09/06/2022)   [DISCONTINUED] ondansetron (ZOFRAN-ODT) 8 MG disintegrating tablet Take 1 tablet (8 mg total) by mouth every 8 (eight) hours as needed. (Patient not taking: Reported on 09/06/2022)   No facility-administered medications prior to visit.   Reviewed past medical and social history.   ROS per HPI above      Objective:  BP 110/76 (BP Location: Left Arm, Patient Position: Sitting, Cuff Size: Normal)   Pulse 69   Temp 98.6 F (37 C) (Oral)   Resp 16   Ht 5\' 7"  (1.702 m)   Wt 161 lb 12.8 oz (73.4 kg)   SpO2  99%   BMI 25.34 kg/m      Physical Exam Vitals and nursing note reviewed.  HENT:     Right Ear: Decreased hearing noted. There is impacted cerumen.     Left Ear: Decreased hearing noted. There is impacted cerumen.     Ears:     Comments: Bilateral Erythematous TM with effusion post cerumen removal. Eyes:     Extraocular Movements: Extraocular movements intact.  Cardiovascular:     Rate and Rhythm: Normal rate.     Heart sounds: Normal heart sounds.  Pulmonary:     Effort: Pulmonary effort is normal.  Musculoskeletal:     Cervical back: Normal range of motion and neck supple.  Lymphadenopathy:     Cervical: No cervical adenopathy.  Neurological:     Mental Status: She is alert and oriented to person, place, and time.     Cranial Nerves: No cranial nerve deficit.     No results found for any visits on 09/06/22.    Procedure Note: Procedure :  Ear irrigation Indication:  Cerumen impaction (bilateral) with hearing loss Risks, including pain, dizziness, eardrum perforation, bleeding, infection and others as well as benefits were explained to the patient in detail. Verbal consent was obtained and the patient agreed to proceed.  We used "The Elephant Ear Irrigation Device" filled with lukewarm water for irrigation. A large amount wax was recovered. Procedure has also required manual wax removal with an ear loop. Tolerated well. Complications: None. Postprocedure instructions :  Call if problems.   Assessment & Plan:    Problem List Items Addressed This Visit   None Visit Diagnoses     Acute non-recurrent pansinusitis    -  Primary   Relevant Medications   azithromycin (ZITHROMAX Z-PAK) 250 MG tablet   methylPREDNISolone (MEDROL DOSEPAK) 4 MG TBPK tablet   Bilateral impacted cerumen          Migraine Headache vs acute sinus infection Consider Ct head if no improvement  Return if symptoms worsen or fail to improve.     Alysia Penna, NP

## 2022-09-06 NOTE — Patient Instructions (Addendum)
Vitamin D: 2000IU daily Calcium 600-800mg  daily  Start [place]le or Align or Florastor Probiotics. 1cap daily to prevent diarrhea. Ok to use tylenol or naproxen as needed for headache. Call office if no improvement in 1week.

## 2022-09-07 ENCOUNTER — Telehealth: Payer: Self-pay | Admitting: Nurse Practitioner

## 2022-09-07 ENCOUNTER — Encounter: Payer: Self-pay | Admitting: Nurse Practitioner

## 2022-09-07 NOTE — Telephone Encounter (Signed)
Christina [name] [phone]   Pt was here 8/12 for dizziness. She is requesting to be written out of work for 3 days until the medicine is in her system as she is still experiencing the dizziness. She is unable to drive. Her husband will pick up a hard copy.

## 2022-09-08 ENCOUNTER — Ambulatory Visit
Admission: EM | Admit: 2022-09-08 | Discharge: 2022-09-08 | Disposition: A | Discharge status: Home / Self Care | Payer: Federal, State, Local not specified - PPO | Attending: Internal Medicine | Admitting: Internal Medicine

## 2022-09-08 DIAGNOSIS — R42 Dizziness and giddiness: Secondary | ICD-10-CM

## 2022-09-08 MED ORDER — MECLIZINE HCL 25 MG PO TABS: 25.0000 mg | ORAL_TABLET | Freq: Three times a day (TID) | ORAL | 0 refills | Status: DC | PRN

## 2022-09-08 NOTE — Telephone Encounter (Signed)
Pt called in stating she was having a reaction a script she has just gotten from [facility] on 09/06/22. She feels like she is having a medication reaction. I tried to transfer over to nurse triage, they stated they would call her back at   [phone]

## 2022-09-08 NOTE — Discharge Instructions (Signed)
You may take meclizine up to 3 times a day as needed for your vertigo symptoms.  I would also start over-the-counter Flonase daily.  Please follow-up with your PCP if your symptoms do not improve.  Please go to the ER for any worsening symptoms.  I hope you feel better soon!

## 2022-09-08 NOTE — Telephone Encounter (Signed)
Was Triaged by a Nurse and was advised to go to the ED

## 2022-09-08 NOTE — Telephone Encounter (Signed)
FYI:Pt went to [facility] urgent care and was dx with vertigo. I offered an appt, she said she would cb if things did not improve.

## 2022-09-08 NOTE — ED Provider Notes (Signed)
[facility]    CSN: [id number] Arrival date & time: 09/08/22  1306      History   Chief Complaint No chief complaint on file.   HPI Christina [name] is a 52 y.o. female presents for evaluation of dizziness.  Patient was seen by her PCP 2 days ago for 1 month of headaches, congestion, fatigue, and dizziness.  She was started on a Medrol Dosepak as well azithromycin for suspected pansinusitis.  Patient reports her headaches have resolved but she continues to have dizziness that is worsened since starting the Medrol Dosepak.  States the dizziness is a room spinning sensation that occurs with position change or head turning.  Denies any syncope, chest pain, shortness of breath, visual changes, neck pain.  Does have a history of vertigo and states this is similar to her typical vertigo symptoms.  She normally takes over-the-counter nausea medication for this but has not taken anything with her current symptoms.  No other concerns at this time.  HPI  Past Medical History:  Diagnosis Date   GERD (gastroesophageal reflux disease) 2018   Hypercholesteremia    Hyperthyroidism    Migraines    with asthma   Slipping rib syndrome     Patient Active Problem List   Diagnosis Date Noted   Submandibular gland mass 09/21/2021   Trigger point of shoulder region, right 06/29/2021   Vertigo 12/25/2020   Mass of upper outer quadrant of right breast 04/16/2020   Fibroid uterus 02/18/2020   Patellofemoral syndrome, right 01/07/2020   Tinnitus aurium, left 06/14/2019   Nonallopathic lesion of lumbosacral region 08/17/2017   Neck pain 03/18/2017   Hypercholesterolemia 03/08/2017   SI (sacroiliac) joint dysfunction 05/27/2016   Nonallopathic lesion of sacral region 05/27/2016   Left lumbar radiculopathy 12/31/2015   Nonallopathic lesion of thoracic region 07/12/2014   Nonallopathic lesion of cervical region 07/12/2014   Family history of breast cancer 07/10/2014    Class: Family  History of   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 Authorizing Provider  meclizine (ANTIVERT) 25 MG tablet Take 1 tablet (25 mg total) by mouth 3 (three) times daily as needed for dizziness. 09/08/22  Yes Radford Pax, NP  azithromycin (ZITHROMAX Z-PAK) 250 MG tablet Take 1 tablet (250 mg total) by mouth daily. Take 2tabs on first day, then 1tab once a day till complete 09/06/22   [name], Bonna Gains, NP  methylPREDNISolone (MEDROL DOSEPAK) 4 MG TBPK tablet Take as directed on package 09/06/22   [name], Bonna Gains, NP  tiZANidine (ZANAFLEX) 4 MG tablet Take 1 tablet (4 mg total) by mouth at bedtime. 04/05/22  Judi Saa, DO  VITAMIN D PO Take 1 tablet by mouth daily.    [provider]    Family History Family History  Problem Relation Age of Onset   Diabetes Mother    Hypertension Mother    Colon cancer Mother 67   Breast cancer Mother 40   Cancer Mother    Prostate cancer Father    Cancer Father    Colon cancer Maternal Aunt    Colon cancer Maternal Aunt    Breast cancer Cousin    Breast cancer Cousin    Stomach cancer Neg Hx    Esophageal cancer Neg Hx    Colon polyps Neg Hx    Rectal cancer Neg Hx     Social History Social History   Tobacco Use   Smoking status: Never   Smokeless tobacco: Never  Vaping Use   Vaping status: Never Used  Substance Use Topics   Alcohol  use: Yes   Drug use: No     Allergies   Topamax [topiramate]   Review of Systems Review of Systems  Neurological:  Positive for dizziness.     Physical Exam Triage Vital Signs ED Triage Vitals  Encounter Vitals Group     BP 09/08/22 1315 127/89     Systolic BP Percentile --      Diastolic BP Percentile --      Pulse Rate 09/08/22 1315 78     Resp 09/08/22 1315 16     Temp 09/08/22 1315 98 F (36.7 C)     Temp Source 09/08/22 1315 Oral     SpO2 09/08/22 1315 95 %     Weight --      Height --      Head Circumference --      Peak Flow --      Pain Score 09/08/22 1319 0     Pain Loc --      Pain Education --      Exclude from Growth Chart --    No data found.  Updated Vital Signs BP 127/89 (BP Location: Right Arm)   Pulse 78   Temp 98 F (36.7 C) (Oral)   Resp 16   SpO2 95%   Visual Acuity Right Eye Distance:   Left Eye Distance:   Bilateral Distance:    Right Eye Near:   Left Eye Near:    Bilateral Near:     Physical Exam Vitals and nursing note reviewed.  Constitutional:      General: She is not in acute distress.    Appearance: Normal appearance. She is not ill-appearing, toxic-appearing or diaphoretic.  HENT:     Head: Normocephalic and atraumatic.     Right Ear: Ear canal normal. A middle ear effusion is present. Tympanic membrane is not erythematous.     Left Ear: Ear canal normal. A middle ear effusion is present. Tympanic membrane is not erythematous.  Eyes:     Extraocular Movements: Extraocular movements intact.     Conjunctiva/sclera: Conjunctivae normal.     Pupils: Pupils are equal, round, and reactive to light.  Cardiovascular:     Rate and Rhythm: Normal rate and regular rhythm.     Heart sounds: Normal heart sounds.  Pulmonary:     Effort: Pulmonary effort is normal.     Breath sounds: Normal breath sounds.  Skin:    General: Skin is warm and dry.  Neurological:     General: No focal deficit present.  Mental Status: She is  alert and oriented to person, place, and time.     GCS: GCS eye subscore is 4. GCS verbal subscore is 5. GCS motor subscore is 6.     Cranial Nerves: No facial asymmetry.     Motor: No weakness or pronator drift.     Coordination: Romberg sign negative.  Psychiatric:        Mood and Affect: Mood normal.        Behavior: Behavior normal.      UC Treatments / Results  Labs (all labs ordered are listed, but only abnormal results are displayed) Labs Reviewed - No data to display  EKG   Radiology No results found.  Procedures Procedures (including critical care time)  Medications Ordered in UC Medications - No data to display  Initial Impression / Assessment and Plan / UC Course  I have reviewed the triage vital signs and the nursing notes.  Pertinent labs & imaging results that were available during my care of the patient were reviewed by me and considered in my medical decision making (see chart for details).     Reviewed exam and symptoms with patient.  She is well-appearing and in no acute distress.  Patient with 1 month of dizziness that worsened after starting Medrol Dosepak.  Has history of vertigo and reports symptoms are similar.  Discussed side effect of Medrol Dosepak as well as Zithromax as dizziness, unsure if this is contributing as symptoms were ongoing prior to starting these medications.  Advise she can discuss with her PCP about stopping this but will start meclizine as needed for the dizziness.  Advise she follow-up with her PCP in 2 days for recheck.  Strict ER precautions reviewed and patient verbalized understanding. Final Clinical Impressions(s) / UC Diagnoses   Final diagnoses:  Vertigo     Discharge Instructions      You may take meclizine up to 3 times a day as needed for your vertigo symptoms.  I would also start over-the-counter Flonase daily.  Please follow-up with your PCP if your symptoms do not improve.  Please go to the ER for any worsening  symptoms.  I hope you feel better soon!    ED Prescriptions     Medication Sig Dispense Auth. Provider   meclizine (ANTIVERT) 25 MG tablet Take 1 tablet (25 mg total) by mouth 3 (three) times daily as needed for dizziness. 30 tablet Radford Pax, NP      PDMP not reviewed this encounter.   Radford Pax, NP 09/08/22 1408

## 2022-09-08 NOTE — Telephone Encounter (Signed)
Called back and left a message.  We would like to know what type of reaction she is having and which medication is causing her reaction.

## 2022-09-08 NOTE — ED Triage Notes (Signed)
Pt presents to UC w/ c/o dizziness started yesterday morning 2 hours after taking methylprednisolone for sinus infection. She was also started on azithromycin and a probiotic Pt has been taking methylprednisolone as prescribed.

## 2022-09-08 NOTE — Telephone Encounter (Signed)
Letter has been written and sent to MyChart.  Called and left voice message letting her know the letter has been written and was sent to my chart.  If she needs the letter printed, call us back and we will leave at the front desk.

## 2022-10-04 ENCOUNTER — Encounter: Payer: Self-pay | Admitting: Nurse Practitioner

## 2022-10-07 ENCOUNTER — Encounter: Payer: Self-pay | Admitting: Nurse Practitioner

## 2022-10-07 ENCOUNTER — Ambulatory Visit: Payer: Federal, State, Local not specified - PPO | Admitting: Nurse Practitioner

## 2022-10-07 VITALS — BP 120/70 | HR 69 | Temp 97.8°F | Ht 67.0 in | Wt 163.2 lb

## 2022-10-07 DIAGNOSIS — I6523 Occlusion and stenosis of bilateral carotid arteries: Secondary | ICD-10-CM | POA: Insufficient documentation

## 2022-10-07 DIAGNOSIS — E7800 Pure hypercholesterolemia, unspecified: Secondary | ICD-10-CM

## 2022-10-07 DIAGNOSIS — R42 Dizziness and giddiness: Secondary | ICD-10-CM | POA: Diagnosis not present

## 2022-10-07 HISTORY — DX: Occlusion and stenosis of bilateral carotid arteries: I65.23

## 2022-10-07 LAB — BASIC METABOLIC PANEL
BUN: 16 mg/dL (ref 6–23)
CO2: 26 meq/L (ref 19–32)
Calcium: 8.9 mg/dL (ref 8.4–10.5)
Chloride: 105 meq/L (ref 96–112)
Creatinine, Ser: 1.09 mg/dL (ref 0.40–1.20)
GFR: 58.11 mL/min — ABNORMAL LOW (ref >60.00)
Glucose, Bld: 89 mg/dL (ref 70–99)
Potassium: 4.2 meq/L (ref 3.5–5.1)
Sodium: 140 meq/L (ref 135–145)

## 2022-10-07 LAB — CBC
HCT: 43 % (ref 36.0–46.0)
Hemoglobin: 13.9 g/dL (ref 12.0–15.0)
MCHC: 32.4 g/dL (ref 30.0–36.0)
MCV: 87.4 fl (ref 78.0–100.0)
Platelets: 228 10*3/uL (ref 150.0–400.0)
RBC: 4.92 Mil/uL (ref 3.87–5.11)
RDW: 13.2 % (ref 11.5–15.5)
WBC: 4 10*3/uL (ref 4.0–10.5)

## 2022-10-07 LAB — LIPID PANEL
Cholesterol: 153 mg/dL (ref 0–200)
HDL: 41.3 mg/dL (ref >39.00)
LDL Cholesterol: 100 mg/dL — ABNORMAL HIGH (ref 0–99)
NonHDL: 111.91
Total CHOL/HDL Ratio: 4
Triglycerides: 60 mg/dL (ref 0.0–149.0)
VLDL: 12 mg/dL (ref 0.0–40.0)

## 2022-10-07 LAB — TSH: TSH: 2.69 u[IU]/mL (ref 0.35–5.50)

## 2022-10-07 NOTE — Progress Notes (Signed)
Established Patient Visit  Patient: Christina [name]. Klem   DOB: 08-19-1970   52 y.o. Female  MRN: [id number] Visit Date: 10/07/2022  Subjective:    Chief Complaint  Patient presents with   Dizziness    Off balance "woozy feeling"    HPI Hypercholesterolemia Repeat lipid panel  Carotid stenosis, bilateral 2017 carotid US: Bilateral Little if any plaque in the bulb. Low resistance internal carotid Doppler pattern. No tobacco use, no hx of HYPERTENSION or DIABETES or known CAD/PAD/CVD Repeat lipid panel and carotid US  Vertigo Acute on chronic, onset 2months ago, improved but not resolved with use of medrol dose pack and azithromycin. Hx of migraine with aura: has headache occurs 1-2x/week, but not associated with dizziness or vertigo. Hx of tinnitus-bilateral, no hearing loss Trigger: head movement or change in position Admits to drinking 2 bottles of water and 1.5bottle of caffeine soda. No tobacco use, no ALCOHOL use No syncope, no confusion, no focal weakness. No change in gait. MRI 2016 and CT head angio 2016: normal BP Readings from Last 3 Encounters:  10/07/22 120/70  09/08/22 127/89  09/06/22 110/76    Normal neuro exam today She declined referral to PT Ordered repeat CT head, cbc, bmp, tsh Continue use of meclizine prn Advised to maintain 60-64oz of water and avoid caffeine intake F/up in 67month  BP Readings from Last 3 Encounters:  10/07/22 120/70  09/08/22 127/89  09/06/22 110/76    Reviewed medical, surgical, and social history today  Medications: Outpatient Medications Prior to Visit  Medication Sig   meclizine (ANTIVERT) 25 MG tablet Take 1 tablet (25 mg total) by mouth 3 (three) times daily as needed for dizziness.   tiZANidine (ZANAFLEX) 4 MG tablet Take 1 tablet (4 mg total) by mouth at bedtime.   VITAMIN D PO Take 1 tablet by mouth daily.   [DISCONTINUED] azithromycin (ZITHROMAX Z-PAK) 250 MG tablet Take 1 tablet (250 mg total) by mouth  daily. Take 2tabs on first day, then 1tab once a day till complete (Patient not taking: Reported on 10/07/2022)   [DISCONTINUED] methylPREDNISolone (MEDROL DOSEPAK) 4 MG TBPK tablet Take as directed on package (Patient not taking: Reported on 10/07/2022)   No facility-administered medications prior to visit.   Reviewed past medical and social history.   ROS per HPI above  Last CBC Lab Results  Component Value Date   WBC 4.0 10/07/2022   HGB 13.9 10/07/2022   HCT 43.0 10/07/2022   MCV 87.4 10/07/2022   MCH 28.0 06/27/2022   RDW 13.2 10/07/2022   PLT 228.0 10/07/2022   Last metabolic panel Lab Results  Component Value Date   GLUCOSE 101 (H) 06/27/2022   NA 136 06/27/2022   K 3.7 06/27/2022   CL 104 06/27/2022   CO2 25 06/27/2022   BUN 14 06/27/2022   CREATININE 1.02 (H) 06/27/2022   GFRNONAA >60 06/27/2022   CALCIUM 8.6 (L) 06/27/2022   PROT 7.2 06/27/2022   ALBUMIN 3.9 06/27/2022   LABGLOB 2.5 08/29/2018   AGRATIO 1.7 08/29/2018   BILITOT 0.3 06/27/2022   ALKPHOS 39 06/27/2022   AST 17 06/27/2022   ALT 16 06/27/2022   ANIONGAP 7 06/27/2022   Last thyroid functions Lab Results  Component Value Date   TSH 1.92 12/14/2021      Objective:  BP 120/70   Pulse 69   Temp 97.8 F (36.6 C) (Temporal)   Ht 5\' 7"  (  1.702 m)   Wt 163 lb 3.2 oz (74 kg)   SpO2 95%   BMI 25.56 kg/m      Physical Exam HENT:     Right Ear: Tympanic membrane, ear canal and external ear normal.     Left Ear: Tympanic membrane, ear canal and external ear normal.     Nose: Nose normal. No congestion or rhinorrhea.  Eyes:     Extraocular Movements: Extraocular movements intact.     Conjunctiva/sclera: Conjunctivae normal.     Pupils: Pupils are equal, round, and reactive to light.  Cardiovascular:     Rate and Rhythm: Normal rate and regular rhythm.     Pulses: Normal pulses.     Heart sounds: Normal heart sounds.  Pulmonary:     Effort: Pulmonary effort is normal.     Breath sounds:  Normal breath sounds.  Musculoskeletal:     Right lower leg: No edema.     Left lower leg: No edema.  Skin:    General: Skin is warm and dry.     Findings: No rash.  Neurological:     Mental Status: She is alert and oriented to person, place, and time.  Psychiatric:        Mood and Affect: Mood normal.        Behavior: Behavior normal.        Thought Content: Thought content normal.     Results for orders placed or performed in visit on 10/07/22  CBC  Result Value Ref Range   WBC 4.0 4.0 - 10.5 K/uL   RBC 4.92 3.87 - 5.11 Mil/uL   Platelets 228.0 150.0 - 400.0 K/uL   Hemoglobin 13.9 12.0 - 15.0 g/dL   HCT 16.1 09.6 - 04.5 %   MCV 87.4 78.0 - 100.0 fl   MCHC 32.4 30.0 - 36.0 g/dL   RDW 40.9 81.1 - 91.4 %      Assessment & Plan:    Problem List Items Addressed This Visit     Carotid stenosis, bilateral    2017 carotid US: Bilateral Little if any plaque in the bulb. Low resistance internal carotid Doppler pattern. No tobacco use, no hx of HYPERTENSION or DIABETES or known CAD/PAD/CVD Repeat lipid panel and carotid US      Relevant Orders   Lipid panel   VAS US CAROTID   Hypercholesterolemia    Repeat lipid panel      Relevant Orders   Lipid panel   Vertigo - Primary    Acute on chronic, onset 2months ago, improved but not resolved with use of medrol dose pack and azithromycin. Hx of migraine with aura: has headache occurs 1-2x/week, but not associated with dizziness or vertigo. Hx of tinnitus-bilateral, no hearing loss Trigger: head movement or change in position Admits to drinking 2 bottles of water and 1.5bottle of caffeine soda. No tobacco use, no ALCOHOL use No syncope, no confusion, no focal weakness. No change in gait. MRI 2016 and CT head angio 2016: normal BP Readings from Last 3 Encounters:  10/07/22 120/70  09/08/22 127/89  09/06/22 110/76    Normal neuro exam today She declined referral to PT Ordered repeat CT head, cbc, bmp, tsh Continue use of  meclizine prn Advised to maintain 60-64oz of water and avoid caffeine intake F/up in 28month      Relevant Orders   CBC (Completed)   Basic metabolic panel   TSH   CT HEAD WO CONTRAST ( )   Return in about  4 weeks (around 11/04/2022) for Vertigo.     Alysia Penna, NP

## 2022-10-07 NOTE — Assessment & Plan Note (Addendum)
Acute on chronic, onset 2months ago, improved but not resolved with use of medrol dose pack and azithromycin. Hx of migraine with aura: has headache occurs 1-2x/week, but not associated with dizziness or vertigo. Hx of tinnitus-bilateral, no hearing loss Trigger: head movement or change in position Admits to drinking 2 bottles of water and 1.5bottle of caffeine soda. No tobacco use, no ALCOHOL use No syncope, no confusion, no focal weakness. No change in gait. MRI 2016 and CT head angio 2016: normal BP Readings from Last 3 Encounters:  10/07/22 120/70  09/08/22 127/89  09/06/22 110/76    Normal neuro exam today She declined referral to PT Ordered repeat CT head, cbc, bmp, tsh Continue use of meclizine prn Advised to maintain 60-64oz of water and avoid caffeine intake F/up in 53month

## 2022-10-07 NOTE — Assessment & Plan Note (Signed)
Repeat lipid panel ?

## 2022-10-07 NOTE — Assessment & Plan Note (Signed)
2017 carotid US: Bilateral Little if any plaque in the bulb. Low resistance internal carotid Doppler pattern. No tobacco use, no hx of HYPERTENSION or DIABETES or known CAD/PAD/CVD Repeat lipid panel and carotid US

## 2022-10-07 NOTE — Patient Instructions (Signed)
Go to lab Maintain adequate oral hydration: 60-64oz daily Use meclizine as needed You will be contacted to schedule CT head and carotid US Let me know if work accomodation is possible x 53month.  Vertigo Vertigo is the feeling that you or the things around you are moving when they are not. This feeling can come and go at any time. Vertigo often goes away on its own. This condition can be dangerous if it happens when you are doing activities like driving or working with machines. Your doctor will do tests to find the cause of your vertigo. These tests will also help your doctor decide on the best treatment for you. Follow these instructions at home: Eating and drinking     Drink enough fluid to keep your pee (urine) pale yellow. Do not drink alcohol. Activity Return to your normal activities when your doctor says that it is safe. In the morning, first sit up on the side of the bed. When you feel okay, stand slowly while you hold onto something until you know that your balance is fine. Move slowly. Avoid sudden body or head movements or certain positions, as told by your doctor. Use a cane if you have trouble standing or walking. Sit down right away if you feel dizzy. Avoid doing any tasks or activities that can cause danger to you or others if you get dizzy. Avoid bending down if you feel dizzy. Place items in your home so that they are easy for you to reach without bending or leaning over. Do not drive or use machinery if you feel dizzy. General instructions Take over-the-counter and prescription medicines only as told by your doctor. Keep all follow-up visits. Contact a doctor if: Your medicine does not help your vertigo. Your problems get worse or you have new symptoms. You have a fever. You feel like you may vomit (nauseous), or this feeling gets worse. You start to vomit. Your family or friends see changes in how you act. You lose feeling (have numbness) in part of your  body. You feel prickling and tingling in a part of your body. Get help right away if: You are always dizzy. You faint. You get very bad headaches. You get a stiff neck. Bright light starts to bother you. You have trouble moving or talking. You feel weak in your hands, arms, or legs. You have changes in your hearing or in how you see (vision). These symptoms may be an emergency. Get help right away. Call your local emergency services (911 in the [place] Do not wait to see if the symptoms will go away. Do not drive yourself to the hospital. Summary Vertigo is the feeling that you or the things around you are moving when they are not. Your doctor will do tests to find the cause of your vertigo. You may be told to avoid some tasks, positions, or movements. Contact a doctor if your medicine is not helping, or if you have a fever, new symptoms, or a change in how you act. Get help right away if you get very bad headaches, or if you have changes in how you speak, hear, or see. This information is not intended to replace advice given to you by your health care provider. Make sure you discuss any questions you have with your health care provider. Document Revised: 12/12/2019 Document Reviewed: 12/12/2019 Elsevier Patient Education  2024 [employer].

## 2022-10-11 ENCOUNTER — Other Ambulatory Visit: Payer: Self-pay | Admitting: Nurse Practitioner

## 2022-10-11 DIAGNOSIS — Z1231 Encounter for screening mammogram for malignant neoplasm of breast: Secondary | ICD-10-CM

## 2022-10-20 ENCOUNTER — Ambulatory Visit (HOSPITAL_COMMUNITY)
Admission: RE | Admit: 2022-10-20 | Discharge: 2022-10-20 | Disposition: A | Discharge status: Home / Self Care | Payer: Federal, State, Local not specified - PPO | Source: Ambulatory Visit | Attending: Nurse Practitioner | Admitting: Nurse Practitioner

## 2022-10-20 DIAGNOSIS — I6523 Occlusion and stenosis of bilateral carotid arteries: Secondary | ICD-10-CM | POA: Diagnosis not present

## 2022-10-21 ENCOUNTER — Ambulatory Visit
Admission: RE | Admit: 2022-10-21 | Discharge: 2022-10-21 | Disposition: A | Discharge status: Home / Self Care | Payer: Federal, State, Local not specified - PPO | Source: Ambulatory Visit | Attending: Nurse Practitioner | Admitting: Nurse Practitioner

## 2022-10-21 DIAGNOSIS — R42 Dizziness and giddiness: Secondary | ICD-10-CM

## 2022-10-21 DIAGNOSIS — R519 Headache, unspecified: Secondary | ICD-10-CM | POA: Diagnosis not present

## 2022-10-22 NOTE — Addendum Note (Signed)
Addended by: Alysia Penna L on: 10/22/2022 12:45 PM   Modules accepted: Orders

## 2022-10-25 ENCOUNTER — Ambulatory Visit: Payer: Federal, State, Local not specified - PPO | Admitting: Obstetrics and Gynecology

## 2022-11-03 ENCOUNTER — Other Ambulatory Visit: Payer: Self-pay | Admitting: Nurse Practitioner

## 2022-11-03 DIAGNOSIS — R42 Dizziness and giddiness: Secondary | ICD-10-CM

## 2022-11-03 DIAGNOSIS — G43109 Migraine with aura, not intractable, without status migrainosus: Secondary | ICD-10-CM

## 2022-11-08 ENCOUNTER — Ambulatory Visit
Admission: RE | Admit: 2022-11-08 | Discharge: 2022-11-08 | Disposition: A | Discharge status: Home / Self Care | Payer: Federal, State, Local not specified - PPO | Source: Ambulatory Visit | Attending: Nurse Practitioner | Admitting: Nurse Practitioner

## 2022-11-08 DIAGNOSIS — Z1231 Encounter for screening mammogram for malignant neoplasm of breast: Secondary | ICD-10-CM

## 2022-11-10 ENCOUNTER — Other Ambulatory Visit: Payer: Self-pay | Admitting: Nurse Practitioner

## 2022-11-10 DIAGNOSIS — N6320 Unspecified lump in the left breast, unspecified quadrant: Secondary | ICD-10-CM

## 2022-11-15 DIAGNOSIS — Z1330 Encounter for screening examination for mental health and behavioral disorders, unspecified: Secondary | ICD-10-CM | POA: Diagnosis not present

## 2022-11-15 DIAGNOSIS — G43409 Hemiplegic migraine, not intractable, without status migrainosus: Secondary | ICD-10-CM | POA: Diagnosis not present

## 2022-11-15 DIAGNOSIS — R42 Dizziness and giddiness: Secondary | ICD-10-CM | POA: Diagnosis not present

## 2022-11-23 ENCOUNTER — Other Ambulatory Visit: Payer: Self-pay | Admitting: Nurse Practitioner

## 2022-11-23 ENCOUNTER — Ambulatory Visit
Admission: RE | Admit: 2022-11-23 | Discharge: 2022-11-23 | Disposition: A | Discharge status: Home / Self Care | Payer: Federal, State, Local not specified - PPO | Source: Ambulatory Visit | Attending: Nurse Practitioner | Admitting: Nurse Practitioner

## 2022-11-23 DIAGNOSIS — R921 Mammographic calcification found on diagnostic imaging of breast: Secondary | ICD-10-CM | POA: Diagnosis not present

## 2022-11-23 DIAGNOSIS — N6320 Unspecified lump in the left breast, unspecified quadrant: Secondary | ICD-10-CM

## 2022-11-24 ENCOUNTER — Ambulatory Visit
Admission: RE | Admit: 2022-11-24 | Discharge: 2022-11-24 | Disposition: A | Discharge status: Home / Self Care | Payer: Federal, State, Local not specified - PPO | Source: Ambulatory Visit | Attending: Nurse Practitioner

## 2022-11-24 ENCOUNTER — Other Ambulatory Visit: Payer: Self-pay | Admitting: Nurse Practitioner

## 2022-11-24 ENCOUNTER — Inpatient Hospital Stay
Admission: RE | Admit: 2022-11-24 | Discharge: 2022-11-24 | Discharge status: Home / Self Care | Payer: Federal, State, Local not specified - PPO | Source: Ambulatory Visit | Attending: Nurse Practitioner | Admitting: Nurse Practitioner

## 2022-11-24 ENCOUNTER — Ambulatory Visit
Admission: RE | Admit: 2022-11-24 | Discharge: 2022-11-24 | Disposition: A | Discharge status: Home / Self Care | Payer: Federal, State, Local not specified - PPO | Source: Ambulatory Visit | Attending: Nurse Practitioner | Admitting: Nurse Practitioner

## 2022-11-24 DIAGNOSIS — N62 Hypertrophy of breast: Secondary | ICD-10-CM | POA: Diagnosis not present

## 2022-11-24 DIAGNOSIS — R921 Mammographic calcification found on diagnostic imaging of breast: Secondary | ICD-10-CM

## 2022-11-24 DIAGNOSIS — N6012 Diffuse cystic mastopathy of left breast: Secondary | ICD-10-CM | POA: Diagnosis not present

## 2022-11-24 HISTORY — PX: BREAST BIOPSY: SHX20

## 2022-11-25 LAB — SURGICAL PATHOLOGY

## 2022-12-01 DIAGNOSIS — R42 Dizziness and giddiness: Secondary | ICD-10-CM | POA: Diagnosis not present

## 2022-12-06 ENCOUNTER — Ambulatory Visit (INDEPENDENT_AMBULATORY_CARE_PROVIDER_SITE_OTHER): Payer: Federal, State, Local not specified - PPO | Admitting: Obstetrics and Gynecology

## 2022-12-06 ENCOUNTER — Encounter: Payer: Self-pay | Admitting: Obstetrics and Gynecology

## 2022-12-06 VITALS — BP 110/80 | HR 70 | Ht 66.93 in | Wt 165.0 lb

## 2022-12-06 DIAGNOSIS — N6009 Solitary cyst of unspecified breast: Secondary | ICD-10-CM

## 2022-12-06 DIAGNOSIS — N6001 Solitary cyst of right breast: Secondary | ICD-10-CM | POA: Insufficient documentation

## 2022-12-06 DIAGNOSIS — Z01419 Encounter for gynecological examination (general) (routine) without abnormal findings: Secondary | ICD-10-CM | POA: Insufficient documentation

## 2022-12-06 DIAGNOSIS — N926 Irregular menstruation, unspecified: Secondary | ICD-10-CM | POA: Diagnosis not present

## 2022-12-06 HISTORY — DX: Encounter for gynecological examination (general) (routine) without abnormal findings: Z01.419

## 2022-12-06 NOTE — Progress Notes (Signed)
52 y.o. [id number] female with fibrocystic breast with left abnormal mammogram with benign biopsies and irregular periiods here for annual exam. Married.  Patient's last menstrual period was 12/06/2022. Period Duration (Days): 4 Period Pattern: (!) Irregular Menstrual Flow: Light Menstrual Control: Maxi pad Dysmenorrhea: None  Had a 4d cycle in April then had 1d of spotting in June and today  Abnormal bleeding: irregular periods Pelvic discharge or pain: none Breast mass, nipple discharge or skin changes : firmness at biopsy site Birth control: none Last PAP:     Component Value Date/Time   DIAGPAP  08/29/2018 0000    NEGATIVE FOR INTRAEPITHELIAL LESIONS OR MALIGNANCY.   DIAGPAP  08/29/2018 0000    FUNGAL ORGANISMS PRESENT CONSISTENT WITH CANDIDA SPP.   DIAGPAP  07/29/2016 0000    NEGATIVE FOR INTRAEPITHELIAL LESIONS OR MALIGNANCY.   ADEQPAP  08/29/2018 0000    Satisfactory for evaluation  endocervical/transformation zone component PRESENT.   ADEQPAP  07/29/2016 0000    Satisfactory for evaluation  endocervical/transformation zone component PRESENT.   Last mammogram: 11/08/22 BIRADS 4, s/p biopsies benign Last colonoscopy: 06/12/19, q5 years Sexually active: yes  Exercising: rarely  GYN HISTORY: Bilateral breast biopsies, left in 2024  OB History  Gravida Para Term Preterm AB Living  3 1 1   2 1   SAB IAB Ectopic Multiple Live Births  2       1    # Outcome Date GA Lbr Len/2nd Weight Sex Type Anes PTL Lv  3 SAB           2 SAB           1 Term     M Vag-Spont   LIV    Past Medical History:  Diagnosis Date   GERD (gastroesophageal reflux disease) 2018   Hypercholesteremia    Hyperthyroidism    Migraines    with asthma   Slipping rib syndrome    Well woman exam with routine gynecological exam 12/06/2022    Past Surgical History:  Procedure Laterality Date   BREAST BIOPSY Right 06/26/2014   benign per patient   BREAST BIOPSY Left 11/24/2022   MM LT  BREAST BX W LOC DEV EA AD LESION IMG BX SPEC STEREO GUIDE 11/24/2022 [facility]   BREAST BIOPSY Left 11/24/2022   MM LT BREAST BX W LOC DEV 1ST LESION IMAGE BX SPEC STEREO GUIDE 11/24/2022 [facility]   COLONOSCOPY  08/03/2012   in [place] Dr.[place]ano-normal exam,melanosis   UPPER GASTROINTESTINAL ENDOSCOPY  09/23/2016    Current Outpatient Medications on File Prior to Visit  Medication Sig Dispense Refill   meclizine (ANTIVERT) 25 MG tablet Take 1 tablet (25 mg total) by mouth 3 (three) times daily as needed for dizziness. 30 tablet 0   Multiple Vitamin tablet Take by mouth.     tiZANidine (ZANAFLEX) 4 MG tablet Take 1 tablet (4 mg total) by mouth at bedtime. 30 tablet 0   VITAMIN D PO Take 1 tablet by mouth daily.     No current facility-administered medications on file prior to visit.    Social History   Socioeconomic History   Marital status: Married    Spouse name: Not on file   Number of children: 1   Years of education: 14   Highest education level: Some college, no degree  Occupational History   Occupation: Programme researcher, broadcasting/film/video  Tobacco Use   Smoking status: Never   Smokeless tobacco: Never  Vaping Use   Vaping status: Never  Used  Substance and Sexual Activity   Alcohol use: Yes   Drug use: No   Sexual activity: Yes    Partners: Male  Other Topics Concern   Not on file  Social History Narrative   Born and raised in [place].    Currently resides in a house with her child. No pets. Fun: Go to the movies.    Denies religious beliefs effecting health care.    Social Determinants of Health   Financial Resource Strain: Low Risk  (11/30/2022)   Received from [facility]   Overall Financial Resource Strain (CARDIA)    Difficulty of Paying Living Expenses: Not hard at all  Food Insecurity: No Food Insecurity (11/30/2022)   Received from [facility]   Hunger Vital Sign    Worried About Running Out of Food in the Last Year: Never true    Ran Out of Food in  the Last Year: Never true  Transportation Needs: No Transportation Needs (11/30/2022)   Received from [facility] - Transportation    Lack of Transportation (Medical): No    Lack of Transportation (Non-Medical): No  Physical Activity: Insufficiently Active (11/30/2022)   Received from [facility]   Exercise Vital Sign    Days of Exercise per Week: 1 day    Minutes of Exercise per Session: 10 min  Stress: No Stress Concern Present (11/30/2022)   Received from [facility] Occup[facility] - Occupational Stress Questionnaire    Feeling of Stress : Not at all  Social Connections: Moderately Integrated (11/30/2022)   Received from [facility]   Social Network    How would you rate your social network (family, work, friends)?: Adequate participation with social networks  Intimate Partner Violence: Not At Risk (11/30/2022)   Received from [facility]   HITS    Over the last 12 months how often did your partner physically hurt you?: Never    Over the last 12 months how often did your partner insult you or talk down to you?: Never    Over the last 12 months how often did your partner threaten you with physical harm?: Never    Over the last 12 months how often did your partner scream or curse at you?: Never    Family History  Problem Relation Age of Onset   Diabetes Mother    Hypertension Mother    Colon cancer Mother 30   Breast cancer Mother 35   Cancer Mother    Prostate cancer Father    Cancer Father    Colon cancer Maternal Aunt    Colon cancer Maternal Aunt    Breast cancer Cousin    Breast cancer Cousin    Stomach cancer Neg Hx    Esophageal cancer Neg Hx    Colon polyps Neg Hx    Rectal cancer Neg Hx     Allergies  Allergen Reactions   Topamax [Topiramate]     Pain in legs, SOB, and almost passed out      PE Today's Vitals   12/06/22 1336  BP: 110/80  Pulse: 70  SpO2: 98%  Weight: 165 lb (74.8 kg)  Height: 5' 6.93"  (1.7 m)   Body mass index is 25.9 kg/m.  Physical Exam Vitals reviewed. Exam conducted with a chaperone present.  Constitutional:      General: She is not in acute distress.    Appearance: Normal appearance.  HENT:     Head: Normocephalic  and atraumatic.     Nose: Nose normal.  Eyes:     Extraocular Movements: Extraocular movements intact.     Conjunctiva/sclera: Conjunctivae normal.  Neck:     Thyroid: No thyroid mass, thyromegaly or thyroid tenderness.  Pulmonary:     Effort: Pulmonary effort is normal.  Chest:     Chest wall: No mass or tenderness.  Breasts:    Right: Normal. No swelling, mass, nipple discharge, skin change or tenderness.     Left: Normal. No swelling, mass, nipple discharge, skin change or tenderness.  Abdominal:     General: There is no distension.     Palpations: Abdomen is soft.     Tenderness: There is no abdominal tenderness.  Genitourinary:    General: Normal vulva.     Exam position: Lithotomy position.     Urethra: No prolapse.     Vagina: Normal. No vaginal discharge or bleeding.     Cervix: Normal. No lesion.     Uterus: Normal. Not enlarged and not tender.      Adnexa: Right adnexa normal and left adnexa normal.  Musculoskeletal:        General: Normal range of motion.     Cervical back: Normal range of motion.  Lymphadenopathy:     Upper Body:     Right upper body: No axillary adenopathy.     Left upper body: No axillary adenopathy.     Lower Body: No right inguinal adenopathy. No left inguinal adenopathy.  Skin:    General: Skin is warm and dry.  Neurological:     General: No focal deficit present.     Mental Status: She is alert.  Psychiatric:        Mood and Affect: Mood normal.        Behavior: Behavior normal.       Assessment and Plan:        Well woman exam with routine gynecological exam Assessment & Plan: Cervical cancer screening performed according to ASCCP guidelines. Encouraged annual mammogram  screening Colonoscopy UTD DXA N/A Labs and immunizations with her primary Encouraged safe sexual practices as indicated Encouraged healthy lifestyle practices with diet and exercise For patients under 50-70yo, I recommend 1200mg  calcium daily and 600IU of vitamin D daily.    Irregular menses Assessment & Plan: Notes spotting in June and today Recommend TVUS to rule out pathology  Orders: -     US PELVIS TRANSVAGINAL NON-OB (TV ONLY); Future  Bilateral breast cysts Assessment & Plan: Abnormal mammogram with benign biopsies of left breast Repeat left MMG in 6 months CBE in 6 months  Orders: -     MM 3D DIAGNOSTIC MAMMOGRAM UNILATERAL LEFT BREAST; Future -     [place] LIMITED ULTRASOUND INCLUDING AXILLA LEFT BREAST ; Future    Rosalyn Gess, MD

## 2022-12-06 NOTE — Patient Instructions (Signed)
For patients under 50-53yo, I recommend 1200mg  calcium daily and 600IU of vitamin D daily. For patients over 53yo, I recommend 1200mg  calcium daily and 800IU of vitamin D daily.  Health Maintenance, Female Adopting a healthy lifestyle and getting preventive care are important in promoting health and wellness. Ask your health care provider about: The right schedule for you to have regular tests and exams. Things you can do on your own to prevent diseases and keep yourself healthy. What should I know about diet, weight, and exercise? Eat a healthy diet  Eat a diet that includes plenty of vegetables, fruits, low-fat dairy products, and lean protein. Do not eat a lot of foods that are high in solid fats, added sugars, or sodium. Maintain a healthy weight Body mass index (BMI) is used to identify weight problems. It estimates body fat based on height and weight. Your health care provider can help determine your BMI and help you achieve or maintain a healthy weight. Get regular exercise Get regular exercise. This is one of the most important things you can do for your health. Most adults should: Exercise for at least 150 minutes each week. The exercise should increase your heart rate and make you sweat (moderate-intensity exercise). Do strengthening exercises at least twice a week. This is in addition to the moderate-intensity exercise. Spend less time sitting. Even light physical activity can be beneficial. Watch cholesterol and blood lipids Have your blood tested for lipids and cholesterol at 53 years of age, then have this test every 5 years. Have your cholesterol levels checked more often if: Your lipid or cholesterol levels are high. You are older than 53 years of age. You are at high risk for heart disease. What should I know about cancer screening? Depending on your health history and family history, you may need to have cancer screening at various ages. This may include screening  for: Breast cancer. Cervical cancer. Colorectal cancer. Skin cancer. Lung cancer. What should I know about heart disease, diabetes, and high blood pressure? Blood pressure and heart disease High blood pressure causes heart disease and increases the risk of stroke. This is more likely to develop in people who have high blood pressure readings or are overweight. Have your blood pressure checked: Every 3-5 years if you are 83-53 years of age. Every year if you are 4 years old or older. Diabetes Have regular diabetes screenings. This checks your fasting blood sugar level. Have the screening done: Once every three years after age 68 if you are at a normal weight and have a low risk for diabetes. More often and at a younger age if you are overweight or have a high risk for diabetes. What should I know about preventing infection? Hepatitis B If you have a higher risk for hepatitis B, you should be screened for this virus. Talk with your health care provider to find out if you are at risk for hepatitis B infection. Hepatitis C Testing is recommended for: Everyone born from [birth year] through 1966. Anyone with known risk factors for hepatitis C. Sexually transmitted infections (STIs) Get screened for STIs, including gonorrhea and chlamydia, if: You are sexually active and are younger than 53 years of age. You are older than 53 years of age and your health care provider tells you that you are at risk for this type of infection. Your sexual activity has changed since you were last screened, and you are at increased risk for chlamydia or gonorrhea. Ask your health care provider if  you are at risk. Ask your health care provider about whether you are at high risk for HIV. Your health care provider may recommend a prescription medicine to help prevent HIV infection. If you choose to take medicine to prevent HIV, you should first get tested for HIV. You should then be tested every 3 months for as long as you  are taking the medicine. Osteoporosis and menopause Osteoporosis is a disease in which the bones lose minerals and strength with aging. This can result in bone fractures. If you are 44 years old or older, or if you are at risk for osteoporosis and fractures, ask your health care provider if you should: Be screened for bone loss. Take a calcium or vitamin D supplement to lower your risk of fractures. Be given hormone replacement therapy (HRT) to treat symptoms of menopause. Follow these instructions at home: Alcohol use Do not drink alcohol if: Your health care provider tells you not to drink. You are pregnant, may be pregnant, or are planning to become pregnant. If you drink alcohol: Limit how much you have to: 0-1 drink a day. Know how much alcohol is in your drink. In the [place] one drink equals one 12 oz bottle of beer (355 mL), one 5 oz glass of wine (148 mL), or one 1 oz glass of hard liquor (44 mL). Lifestyle Do not use any products that contain nicotine or tobacco. These products include cigarettes, chewing tobacco, and vaping devices, such as e-cigarettes. If you need help quitting, ask your health care provider. Do not use street drugs. Do not share needles. Ask your health care provider for help if you need support or information about quitting drugs. General instructions Schedule regular health, dental, and eye exams. Stay current with your vaccines. Tell your health care provider if: You often feel depressed. You have ever been abused or do not feel safe at home. Summary Adopting a healthy lifestyle and getting preventive care are important in promoting health and wellness. Follow your health care provider's instructions about healthy diet, exercising, and getting tested or screened for diseases. Follow your health care provider's instructions on monitoring your cholesterol and blood pressure. This information is not intended to replace advice given to you by your health  care provider. Make sure you discuss any questions you have with your health care provider. Document Revised: 06/02/2020 Document Reviewed: 06/02/2020 Elsevier Patient Education  2024 [employer].

## 2022-12-06 NOTE — Assessment & Plan Note (Addendum)
Abnormal mammogram with benign biopsies of left breast Repeat left MMG in 6 months CBE in 6 months

## 2022-12-06 NOTE — Assessment & Plan Note (Signed)
 Cervical cancer screening performed according to ASCCP guidelines. Encouraged annual mammogram screening Colonoscopy UTD DXA N/A Labs and immunizations with her primary Encouraged safe sexual practices as indicated Encouraged healthy lifestyle practices with diet and exercise For patients under 50-53yo, I recommend 1200mg  calcium daily and 600IU of vitamin D daily.

## 2022-12-06 NOTE — Assessment & Plan Note (Signed)
Notes spotting in June and today Recommend TVUS to rule out pathology

## 2022-12-20 ENCOUNTER — Encounter: Payer: Self-pay | Admitting: Nurse Practitioner

## 2022-12-20 ENCOUNTER — Ambulatory Visit (INDEPENDENT_AMBULATORY_CARE_PROVIDER_SITE_OTHER): Payer: Federal, State, Local not specified - PPO | Admitting: Nurse Practitioner

## 2022-12-20 VITALS — BP 110/79 | HR 89 | Temp 98.2°F | Resp 18 | Ht 67.0 in | Wt 167.0 lb

## 2022-12-20 DIAGNOSIS — Z0000 Encounter for general adult medical examination without abnormal findings: Secondary | ICD-10-CM | POA: Diagnosis not present

## 2022-12-20 DIAGNOSIS — Z0001 Encounter for general adult medical examination with abnormal findings: Secondary | ICD-10-CM

## 2022-12-20 LAB — COMPREHENSIVE METABOLIC PANEL
ALT: 16 U/L (ref 0–35)
AST: 18 U/L (ref 0–37)
Albumin: 4.3 g/dL (ref 3.5–5.2)
Alkaline Phosphatase: 48 U/L (ref 39–117)
BUN: 16 mg/dL (ref 6–23)
CO2: 28 meq/L (ref 19–32)
Calcium: 9.1 mg/dL (ref 8.4–10.5)
Chloride: 105 meq/L (ref 96–112)
Creatinine, Ser: 1.03 mg/dL (ref 0.40–1.20)
GFR: 62.11 mL/min (ref >60.00)
Glucose, Bld: 92 mg/dL (ref 70–99)
Potassium: 3.8 meq/L (ref 3.5–5.1)
Sodium: 139 meq/L (ref 135–145)
Total Bilirubin: 0.5 mg/dL (ref 0.2–1.2)
Total Protein: 7.3 g/dL (ref 6.0–8.3)

## 2022-12-20 NOTE — Patient Instructions (Addendum)
Calcium 1200mg  daily and vit. D3 2000IU daily for bone health Go to lab  Preventive Care 52-53 Years Old, Female Preventive care refers to lifestyle choices and visits with your health care provider that can promote health and wellness. Preventive care visits are also called wellness exams. What can I expect for my preventive care visit? Counseling Your health care provider may ask you questions about your: Medical history, including: Past medical problems. Family medical history. Pregnancy history. Current health, including: Menstrual cycle. Method of birth control. Emotional well-being. Home life and relationship well-being. Sexual activity and sexual health. Lifestyle, including: Alcohol, nicotine or tobacco, and drug use. Access to firearms. Diet, exercise, and sleep habits. Work and work Astronomer. Sunscreen use. Safety issues such as seatbelt and bike helmet use. Physical exam Your health care provider will check your: Height and weight. These may be used to calculate your BMI (body mass index). BMI is a measurement that tells if you are at a healthy weight. Waist circumference. This measures the distance around your waistline. This measurement also tells if you are at a healthy weight and may help predict your risk of certain diseases, such as type 2 diabetes and high blood pressure. Heart rate and blood pressure. Body temperature. Skin for abnormal spots. What immunizations do I need?  Vaccines are usually given at various ages, according to a schedule. Your health care provider will recommend vaccines for you based on your age, medical history, and lifestyle or other factors, such as travel or where you work. What tests do I need? Screening Your health care provider may recommend screening tests for certain conditions. This may include: Lipid and cholesterol levels. Diabetes screening. This is done by checking your blood sugar (glucose) after you have not eaten for a  while (fasting). Pelvic exam and Pap test. Hepatitis B test. Hepatitis C test. HIV (human immunodeficiency virus) test. STI (sexually transmitted infection) testing, if you are at risk. Lung cancer screening. Colorectal cancer screening. Mammogram. Talk with your health care provider about when you should start having regular mammograms. This may depend on whether you have a family history of breast cancer. BRCA-related cancer screening. This may be done if you have a family history of breast, ovarian, tubal, or peritoneal cancers. Bone density scan. This is done to screen for osteoporosis. Talk with your health care provider about your test results, treatment options, and if necessary, the need for more tests. Follow these instructions at home: Eating and drinking  Eat a diet that includes fresh fruits and vegetables, whole grains, lean protein, and low-fat dairy products. Take vitamin and mineral supplements as recommended by your health care provider. Do not drink alcohol if: Your health care provider tells you not to drink. You are pregnant, may be pregnant, or are planning to become pregnant. If you drink alcohol: Limit how much you have to 0-1 drink a day. Know how much alcohol is in your drink. In the [place] one drink equals one 12 oz bottle of beer (355 mL), one 5 oz glass of wine (148 mL), or one 1 oz glass of hard liquor (44 mL). Lifestyle Brush your teeth every morning and night with fluoride toothpaste. Floss one time each day. Exercise for at least 30 minutes 5 or more days each week. Do not use any products that contain nicotine or tobacco. These products include cigarettes, chewing tobacco, and vaping devices, such as e-cigarettes. If you need help quitting, ask your health care provider. Do not use drugs. If you  are sexually active, practice safe sex. Use a condom or other form of protection to prevent STIs. If you do not wish to become pregnant, use a form of birth  control. If you plan to become pregnant, see your health care provider for a prepregnancy visit. Take aspirin only as told by your health care provider. Make sure that you understand how much to take and what form to take. Work with your health care provider to find out whether it is safe and beneficial for you to take aspirin daily. Find healthy ways to manage stress, such as: Meditation, yoga, or listening to music. Journaling. Talking to a trusted person. Spending time with friends and family. Minimize exposure to UV radiation to reduce your risk of skin cancer. Safety Always wear your seat belt while driving or riding in a vehicle. Do not drive: If you have been drinking alcohol. Do not ride with someone who has been drinking. When you are tired or distracted. While texting. If you have been using any mind-altering substances or drugs. Wear a helmet and other protective equipment during sports activities. If you have firearms in your house, make sure you follow all gun safety procedures. Seek help if you have been physically or sexually abused. What's next? Visit your health care provider once a year for an annual wellness visit. Ask your health care provider how often you should have your eyes and teeth checked. Stay up to date on all vaccines. This information is not intended to replace advice given to you by your health care provider. Make sure you discuss any questions you have with your health care provider. Document Revised: 07/09/2020 Document Reviewed: 07/09/2020 Elsevier Patient Education  2024 [employer].

## 2022-12-20 NOTE — Assessment & Plan Note (Signed)
Improved Eval by cardiology and neurology: no abnormal finding

## 2022-12-20 NOTE — Progress Notes (Signed)
Complete physical exam  Patient: Christina [name]. Check   DOB: [birth year]   52 y.o. Female  MRN: [id number] Visit Date: 12/20/2022  Subjective:    Chief Complaint  Patient presents with   Annual Exam    PT is here for annual exam    Christina [name] is a 52 y.o. female who presents today for a complete physical exam. She reports consuming a general diet.  Walking daily  She generally feels well. She reports sleeping well. She does not have additional problems to discuss today.  Vision:Yes Dental:Yes STD Screen:No  BP Readings from Last 3 Encounters:  12/20/22 110/79  12/06/22 110/80  10/07/22 120/70   Wt Readings from Last 3 Encounters:  12/20/22 167 lb (75.8 kg)  12/06/22 165 lb (74.8 kg)  10/07/22 163 lb 3.2 oz (74 kg)   Most recent fall risk assessment:    12/20/2022   11:00 AM  Fall Risk   Falls in the past year? 0  Number falls in past yr: 0  Injury with Fall? 0  Risk for fall due to : No Fall Risks  Follow up Falls evaluation completed     Depression screen:Yes - No Depression Most recent depression screenings:    12/20/2022   11:02 AM 09/06/2022    1:50 PM  PHQ 2/9 Scores  PHQ - 2 Score 0 0  PHQ- 9 Score 0     HPI  Vertigo Improved Eval by cardiology and neurology: no abnormal finding  Carotid stenosis, bilateral Repeat carotid US09/2024: no evidence of stenosis   Past Medical History:  Diagnosis Date   Carotid stenosis, bilateral 10/07/2022   GERD (gastroesophageal reflux disease) 2018   Hypercholesteremia    Hyperthyroidism    Migraines    with asthma   Slipping rib syndrome    Well woman exam with routine gynecological exam 12/06/2022   Past Surgical History:  Procedure Laterality Date   BREAST BIOPSY Right 06/26/2014   benign per patient   BREAST BIOPSY Left 11/24/2022   MM LT BREAST BX W LOC DEV EA AD LESION IMG BX SPEC STEREO GUIDE 11/24/2022 [facility]   BREAST BIOPSY Left 11/24/2022   MM LT BREAST BX W LOC DEV 1ST LESION  IMAGE BX SPEC STEREO GUIDE 11/24/2022 [facility]   COLONOSCOPY  08/03/2012   in [place] Dr.Catalano-normal exam,melanosis   UPPER GASTROINTESTINAL ENDOSCOPY  09/23/2016   Social History   Socioeconomic History   Marital status: Married    Spouse name: Not on file   Number of children: 1   Years of education: 14   Highest education level: Some college, no degree  Occupational History   Occupation: Programme researcher, broadcasting/film/video  Tobacco Use   Smoking status: Never   Smokeless tobacco: Never  Vaping Use   Vaping status: Never Used  Substance and Sexual Activity   Alcohol use: Yes   Drug use: No   Sexual activity: Yes    Partners: Male  Other Topics Concern   Not on file  Social History Narrative   Born and raised in [place].    Currently resides in a house with her child. No pets. Fun: Go to the movies.    Denies religious beliefs effecting health care.    Social Determinants of Health   Financial Resource Strain: Low Risk  (11/30/2022)   Received from [facility]   Overall Financial Resource Strain (CARDIA)    Difficulty of Paying Living Expenses: Not hard at all  Food Insecurity: No Food  Insecurity (11/30/2022)   Received from [facility]   Hunger Vital Sign    Worried About Running Out of Food in the Last Year: Never true    Ran Out of Food in the Last Year: Never true  Transportation Needs: No Transportation Needs (11/30/2022)   Received from [facility] - Transportation    Lack of Transportation (Medical): No    Lack of Transportation (Non-Medical): No  Physical Activity: Insufficiently Active (11/30/2022)   Received from [facility]   Exercise Vital Sign    Days of Exercise per Week: 1 day    Minutes of Exercise per Session: 10 min  Stress: No Stress Concern Present (11/30/2022)   Received from [facility] - Occupational Stress Questionnaire    Feeling of Stress : Not at all  Social Connections: Moderately  Integrated (11/30/2022)   Received from [facility]   Social Network    How would you rate your social network (family, work, friends)?: Adequate participation with social networks  Intimate Partner Violence: Not At Risk (11/30/2022)   Received from [facility]   HITS    Over the last 12 months how often did your partner physically hurt you?: Never    Over the last 12 months how often did your partner insult you or talk down to you?: Never    Over the last 12 months how often did your partner threaten you with physical harm?: Never    Over the last 12 months how often did your partner scream or curse at you?: Never   Family Status  Relation Name Status   Mother deceased Deceased at age 71   Father deceased Deceased   Mat Aunt  (Not Specified)   Mat Aunt  (Not Specified)   Cousin  (Not Specified)   Cousin  (Not Specified)   Neg Hx  (Not Specified)  No partnership data on file   Family History  Problem Relation Age of Onset   Diabetes Mother    Hypertension Mother    Colon cancer Mother 37   Breast cancer Mother 30   Cancer Mother    Prostate cancer Father    Cancer Father    Colon cancer Maternal Aunt    Colon cancer Maternal Aunt    Breast cancer Cousin    Breast cancer Cousin    Stomach cancer Neg Hx    Esophageal cancer Neg Hx    Colon polyps Neg Hx    Rectal cancer Neg Hx    Allergies  Allergen Reactions   Topamax [Topiramate]     Pain in legs, SOB, and almost passed out    Patient Care Team: Jarl Sellitto, Bonna Gains, NP as PCP - General (Internal Medicine) Romualdo Bolk, MD (Inactive) as Consulting Physician (Obstetrics and Gynecology)   Medications: Outpatient Medications Prior to Visit  Medication Sig   meclizine (ANTIVERT) 25 MG tablet Take 1 tablet (25 mg total) by mouth 3 (three) times daily as needed for dizziness.   Multiple Vitamin tablet Take by mouth.   tiZANidine (ZANAFLEX) 4 MG tablet Take 1 tablet (4 mg total) by mouth at bedtime.   VITAMIN  D PO Take 1 tablet by mouth daily.   No facility-administered medications prior to visit.    Review of Systems  Constitutional:  Negative for activity change, appetite change and unexpected weight change.  Respiratory: Negative.    Cardiovascular: Negative.   Gastrointestinal: Negative.   Endocrine: Negative for  cold intolerance and heat intolerance.  Genitourinary: Negative.   Musculoskeletal: Negative.   Skin: Negative.   Neurological: Negative.   Hematological: Negative.   Psychiatric/Behavioral:  Negative for behavioral problems, decreased concentration, dysphoric mood, hallucinations, self-injury, sleep disturbance and suicidal ideas. The patient is not nervous/anxious.    Last CBC Lab Results  Component Value Date   WBC 4.0 10/07/2022   HGB 13.9 10/07/2022   HCT 43.0 10/07/2022   MCV 87.4 10/07/2022   MCH 28.0 06/27/2022   RDW 13.2 10/07/2022   PLT 228.0 10/07/2022   Last metabolic panel Lab Results  Component Value Date   GLUCOSE 89 10/07/2022   NA 140 10/07/2022   K 4.2 10/07/2022   CL 105 10/07/2022   CO2 26 10/07/2022   BUN 16 10/07/2022   CREATININE 1.09 10/07/2022   GFR 58.11 (L) 10/07/2022   CALCIUM 8.9 10/07/2022   PROT 7.2 06/27/2022   ALBUMIN 3.9 06/27/2022   LABGLOB 2.5 08/29/2018   AGRATIO 1.7 08/29/2018   BILITOT 0.3 06/27/2022   ALKPHOS 39 06/27/2022   AST 17 06/27/2022   ALT 16 06/27/2022   ANIONGAP 7 06/27/2022   Last lipids Lab Results  Component Value Date   CHOL 153 10/07/2022   HDL 41.30 10/07/2022   LDLCALC 100 (H) 10/07/2022   TRIG 60.0 10/07/2022   CHOLHDL 4 10/07/2022   Last hemoglobin A1c Lab Results  Component Value Date   HGBA1C 5.4 05/27/2014   Last thyroid functions Lab Results  Component Value Date   TSH 2.69 10/07/2022   Last vitamin D Lab Results  Component Value Date   VD25OH 30.62 12/14/2021        Objective:  BP 110/79 (BP Location: Left Arm, Patient Position: Sitting, Cuff Size: Normal)   Pulse 89    Temp 98.2 F (36.8 C) (Temporal)   Resp 18   Ht 5\' 7"  (1.702 m)   Wt 167 lb (75.8 kg)   LMP 12/06/2022 Comment: spotting  SpO2 98%   BMI 26.16 kg/m     Physical Exam Vitals and nursing note reviewed.  Constitutional:      General: She is not in acute distress. HENT:     Right Ear: Tympanic membrane, ear canal and external ear normal.     Left Ear: Tympanic membrane, ear canal and external ear normal.     Nose: Nose normal.  Eyes:     Extraocular Movements: Extraocular movements intact.     Conjunctiva/sclera: Conjunctivae normal.     Pupils: Pupils are equal, round, and reactive to light.  Neck:     Thyroid: No thyroid mass, thyromegaly or thyroid tenderness.  Cardiovascular:     Rate and Rhythm: Normal rate and regular rhythm.     Pulses: Normal pulses.     Heart sounds: Normal heart sounds.  Pulmonary:     Effort: Pulmonary effort is normal.     Breath sounds: Normal breath sounds.  Abdominal:     General: Bowel sounds are normal.     Palpations: Abdomen is soft.  Musculoskeletal:        General: Normal range of motion.     Cervical back: Normal range of motion and neck supple.     Right lower leg: No edema.     Left lower leg: No edema.  Lymphadenopathy:     Cervical: No cervical adenopathy.  Skin:    General: Skin is warm and dry.  Neurological:     Mental Status: She is alert and oriented  to person, place, and time.     Cranial Nerves: No cranial nerve deficit.  Psychiatric:        Mood and Affect: Mood normal.        Behavior: Behavior normal.        Thought Content: Thought content normal.      No results found for any visits on 12/20/22.    Assessment & Plan:    Routine Health Maintenance and Physical Exam  Immunization History  Administered Date(s) Administered   Tdap 05/27/2014   Health Maintenance  Topic Date Due   COVID-19 Vaccine (1) 01/25/2023 (Originally 07[birth year]11/1975)   Zoster Vaccines- Shingrix (1 of 2) 01/25/2023 (Originally  07/10/1988)   INFLUENZA VACCINE  04/25/2023 (Originally 08/26/2022)   Hepatitis C Screening  12/20/2023 (Originally 07/11/1987)   HIV Screening  12/20/2023 (Originally 07/10/1984)   Cervical Cancer Screening (HPV/Pap Cotest)  08/29/2023   MAMMOGRAM  11/23/2023   DTaP/Tdap/Td (2 - Td or Tdap) 05/26/2024   Colonoscopy  06/11/2024   HPV VACCINES  Aged Out   Discussed health benefits of physical activity, and encouraged her to engage in regular exercise appropriate for her age and condition.  Problem List Items Addressed This Visit   None Visit Diagnoses     Encounter for preventative adult health care exam with abnormal findings    -  Primary   Relevant Orders   Comprehensive metabolic panel      Return in about 6 months (around 06/19/2023) for CPE (fasting).     Alysia Penna, NP

## 2022-12-20 NOTE — Assessment & Plan Note (Signed)
Repeat carotid US09/2024: no evidence of stenosis

## 2023-01-06 ENCOUNTER — Ambulatory Visit (INDEPENDENT_AMBULATORY_CARE_PROVIDER_SITE_OTHER): Payer: Federal, State, Local not specified - PPO

## 2023-01-06 ENCOUNTER — Ambulatory Visit (INDEPENDENT_AMBULATORY_CARE_PROVIDER_SITE_OTHER): Payer: Federal, State, Local not specified - PPO | Admitting: Obstetrics and Gynecology

## 2023-01-06 ENCOUNTER — Encounter: Payer: Self-pay | Admitting: Obstetrics and Gynecology

## 2023-01-06 ENCOUNTER — Other Ambulatory Visit: Payer: Self-pay | Admitting: Obstetrics and Gynecology

## 2023-01-06 VITALS — BP 118/82 | HR 63

## 2023-01-06 DIAGNOSIS — N926 Irregular menstruation, unspecified: Secondary | ICD-10-CM

## 2023-01-06 DIAGNOSIS — N6001 Solitary cyst of right breast: Secondary | ICD-10-CM

## 2023-01-06 DIAGNOSIS — N951 Menopausal and female climacteric states: Secondary | ICD-10-CM | POA: Diagnosis not present

## 2023-01-06 DIAGNOSIS — Z01419 Encounter for gynecological examination (general) (routine) without abnormal findings: Secondary | ICD-10-CM

## 2023-01-06 NOTE — Progress Notes (Signed)
52 y.o. [id number] [name] with fibrocystic breast with left abnormal mammogram with benign biopsies and irregular periods here for [place]. Married.  Patient's last menstrual period was 04/26/2022 (approximate). Had a 4d cycle in April then had 1d of spotting in June and November  GYN HISTORY: Bilateral breast biopsies, left in 2024  OB History  Gravida Para Term Preterm AB Living  3 1 1  2 1   SAB IAB Ectopic Multiple Live Births  2    1    # Outcome Date GA Lbr Len/2nd Weight Sex Type Anes PTL Lv  3 SAB           2 SAB           1 Term     M Vag-Spont   LIV    Past Medical History:  Diagnosis Date   Carotid stenosis, bilateral 10/07/2022   GERD (gastroesophageal reflux disease) 2018   Hypercholesteremia    Hyperthyroidism    Migraines    with asthma   Slipping rib syndrome    Well woman exam with routine gynecological exam 12/06/2022    Past Surgical History:  Procedure Laterality Date   BREAST BIOPSY Right 06/26/2014   benign per patient   BREAST BIOPSY Left 11/24/2022   MM LT BREAST BX W LOC DEV EA AD LESION IMG BX SPEC STEREO GUIDE 11/24/2022 [facility]   BREAST BIOPSY Left 11/24/2022   MM LT BREAST BX W LOC DEV 1ST LESION IMAGE BX SPEC STEREO GUIDE 11/24/2022 [facility]   COLONOSCOPY  08/03/2012   in [place] Dr.Catalano-normal exam,melanosis   UPPER GASTROINTESTINAL ENDOSCOPY  09/23/2016    Current Outpatient Medications on File Prior to Visit  Medication Sig Dispense Refill   meclizine (ANTIVERT) 25 MG tablet Take 1 tablet (25 mg total) by mouth 3 (three) times daily as needed for dizziness. 30 tablet 0   Multiple Vitamin tablet Take by mouth.     tiZANidine (ZANAFLEX) 4 MG tablet Take 1 tablet (4 mg total) by mouth at bedtime. 30 tablet 0   VITAMIN D PO Take 1 tablet by mouth daily.     No current facility-administered medications on file prior to visit.   Allergies  Allergen Reactions   Topamax [Topiramate]     Pain in legs, SOB, and almost  passed out    PE Today's Vitals   01/06/23 1019  BP: 118/82  Pulse: Christina  SpO2: 99%   There is no height or weight on file to calculate BMI.  Physical Exam Vitals reviewed.  Constitutional:      General: She is not in acute distress.    Appearance: Normal appearance.  HENT:     Head: Normocephalic and atraumatic.     Nose: Nose normal.  Eyes:     Extraocular Movements: Extraocular movements intact.     Conjunctiva/sclera: Conjunctivae normal.  Pulmonary:     Effort: Pulmonary effort is normal.  Musculoskeletal:        General: Normal range of motion.     Cervical back: Normal range of motion.  Neurological:     General: No focal deficit present.     Mental Status: She is alert.  Psychiatric:        Mood and Affect: Mood normal.        Behavior: Behavior normal.     01/06/2023 TVUS: Indications: Irregular bleeding, perimenopausal  Findings:   Uterus: Anteverted uterus measuring 9.1 x 6.6 x 6.6 cm. Endometrial thickness: 4.6 mm. Fibroids: 1-  5.9 x 4.4 cm, type 5, anterior. 2- 0.8 x 0.8 cm, type 4, posterior. Left ovary: 2.7 x 1.7 x 1.4 cm, normal-appearing.  Small amount of hypoechoic fluid surrounding the left ovary. Right ovary: Not visualized. No free fluid visualized in the posterior cul-de-sac.  Impression:  1.  Uterus with 2 fibroids.  Subserosal fibroid stable.  Noted on prior CT scans in 2023-2024. 2.  Right ovary not visualized.  Rosalyn Gess, MD  Assessment and Plan:        Irregular menses Assessment & Plan: Ultrasound today with stable fibroids.  No evidence of endometrial polyps. Bleeding pattern is consistent with perimenopause. Continue to monitor cycles.  No follow-up imaging needed.   Perimenopausal  See above  RTO in 5 months for f/u [facility]  Rosalyn Gess, MD

## 2023-01-06 NOTE — Assessment & Plan Note (Addendum)
Ultrasound today with stable fibroids.  No evidence of endometrial polyps. Bleeding pattern is consistent with perimenopause. Continue to monitor cycles.  No follow-up imaging needed.

## 2023-02-21 DIAGNOSIS — R42 Dizziness and giddiness: Secondary | ICD-10-CM | POA: Diagnosis not present

## 2023-02-21 DIAGNOSIS — Z1330 Encounter for screening examination for mental health and behavioral disorders, unspecified: Secondary | ICD-10-CM | POA: Diagnosis not present

## 2023-02-21 DIAGNOSIS — G43409 Hemiplegic migraine, not intractable, without status migrainosus: Secondary | ICD-10-CM | POA: Diagnosis not present

## 2023-03-23 NOTE — Progress Notes (Unsigned)
 [facility] [address] [place] [address] Phone: [phone] Subjective:   INadine Counts, am serving as a scribe for Dr. Antoine Primas.  I'm seeing this patient by the request  of:  [name], Bonna Gains, NP  CC: Back and neck pain follow-up  [id number]  Christina [name] is a 53 y.o. female coming in with complaint of back and neck pain. OMT 04/27/2022. Patient states R side trap and thoracic region where slipped rib was. Burning pain has radiated into the arm.  Medications patient has been prescribed: Zanaflex  Taking:    Since we have seen patient she has been in the emergency room twice and has had difficulty with vertigo and dizziness.     Reviewed prior external information including notes and imaging from previsou exam, outside providers and external EMR if available.   As well as notes that were available from care everywhere and other healthcare systems.  Past medical history, social, surgical and family history all reviewed in electronic medical record.  No pertanent information unless stated regarding to the chief complaint.   Past Medical History:  Diagnosis Date   Carotid stenosis, bilateral 10/07/2022   GERD (gastroesophageal reflux disease) 2018   Hypercholesteremia    Hyperthyroidism    Migraines    with asthma   Slipping rib syndrome    Well woman exam with routine gynecological exam 12/06/2022    Allergies  Allergen Reactions   Topamax [Topiramate]     Pain in legs, SOB, and almost passed out     Review of Systems:  No headache, visual changes, nausea, vomiting, diarrhea, constipation, dizziness, abdominal pain, skin rash, fevers, chills, night sweats, weight loss, swollen lymph nodes, body aches, joint swelling, chest pain, shortness of breath, mood changes. POSITIVE muscle aches  Objective  Blood pressure 118/86, pulse 74, height 5\' 7"  (1.702 m), weight 167 lb (75.8 kg), SpO2 98%.   General: No apparent  distress alert and oriented x3 mood and affect normal, dressed appropriately.  HEENT: Pupils equal, extraocular movements intact  Respiratory: Patient's speak in full sentences and does not appear short of breath  Cardiovascular: No lower extremity edema, non tender, no erythema  Gait relatively normal MSK:  Back does have some loss lordosis noted.  Some tenderness to palpation noted in the paraspinal musculature and positive Pearlean Brownie right greater than left.  Significant tightness with going from a sitting to standing position today.  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  Recently did lift something.  Concerned that there is significant muscle tightness noted.  Discussed which activities to do and which ones to avoid.  Toradol and Depo-Medrol injections given today as well.  Muscle relaxers prescribed patient is of worsening pain to seek sooner otherwise follow-up again in 6 to 8 weeks    Nonallopathic problems  Decision today to treat with OMT was based on Physical Exam  After verbal consent patient was treated with HVLA, ME, FPR techniques in cervical, rib, thoracic, lumbar, and sacral  areas  Patient tolerated the procedure well with improvement in symptoms  Patient given exercises, stretches and lifestyle modifications  See medications in patient instructions if  given  Patient will follow up in 4-8 weeks     The above documentation has been reviewed and is accurate and complete Judi Saa, DO         Note: This dictation was prepared with Dragon dictation along with smaller phrase technology. Any transcriptional errors that result from this process are  unintentional.

## 2023-03-25 ENCOUNTER — Encounter: Payer: Self-pay | Admitting: Family Medicine

## 2023-03-25 ENCOUNTER — Ambulatory Visit: Payer: BC Managed Care – PPO | Admitting: Family Medicine

## 2023-03-25 VITALS — BP 118/86 | HR 74 | Ht 67.0 in | Wt 167.0 lb

## 2023-03-25 DIAGNOSIS — M9904 Segmental and somatic dysfunction of sacral region: Secondary | ICD-10-CM

## 2023-03-25 DIAGNOSIS — M2550 Pain in unspecified joint: Secondary | ICD-10-CM

## 2023-03-25 DIAGNOSIS — M9902 Segmental and somatic dysfunction of thoracic region: Secondary | ICD-10-CM | POA: Diagnosis not present

## 2023-03-25 DIAGNOSIS — M9908 Segmental and somatic dysfunction of rib cage: Secondary | ICD-10-CM

## 2023-03-25 DIAGNOSIS — M9903 Segmental and somatic dysfunction of lumbar region: Secondary | ICD-10-CM | POA: Diagnosis not present

## 2023-03-25 DIAGNOSIS — M533 Sacrococcygeal disorders, not elsewhere classified: Secondary | ICD-10-CM | POA: Diagnosis not present

## 2023-03-25 DIAGNOSIS — M9901 Segmental and somatic dysfunction of cervical region: Secondary | ICD-10-CM | POA: Diagnosis not present

## 2023-03-25 MED ORDER — KETOROLAC TROMETHAMINE 30 MG/ML IJ SOLN
30.0000 mg | Freq: Once | INTRAMUSCULAR | Status: AC
  Administered 2023-03-25: 30 mg via INTRAMUSCULAR

## 2023-03-25 MED ORDER — METHYLPREDNISOLONE ACETATE 40 MG/ML IJ SUSP
40.0000 mg | Freq: Once | INTRAMUSCULAR | Status: AC
  Administered 2023-03-25: 40 mg via INTRAMUSCULAR

## 2023-03-25 MED ORDER — TIZANIDINE HCL 4 MG PO TABS: 4.0000 mg | ORAL_TABLET | Freq: Every day | ORAL | 0 refills | Status: AC

## 2023-03-25 NOTE — Patient Instructions (Addendum)
 Zanaflex refill. Return in 4 to 6 weeks.

## 2023-03-25 NOTE — Assessment & Plan Note (Signed)
 Chronic problem with exacerbation.  Recently did lift something.  Concerned that there is significant muscle tightness noted.  Discussed which activities to do and which ones to avoid.  Toradol and Depo-Medrol injections given today as well.  Muscle relaxers prescribed patient is of worsening pain to seek sooner otherwise follow-up again in 6 to 8 weeks

## 2023-03-30 ENCOUNTER — Ambulatory Visit: Payer: BC Managed Care – PPO | Admitting: Family Medicine

## 2023-04-19 NOTE — Progress Notes (Unsigned)
 [facility] [address] [place] [address] Phone: [phone] Subjective:   Bruce Donath, am serving as a scribe for Dr. Antoine Primas.  I'm seeing this patient by the request  of:  [name], Bonna Gains, NP  CC: back and neck pain   [id number]  Christina [name] is a 53 y.o. female coming in with complaint of back and neck pain. OMT on 03/25/2023. Patient states that her back is doing much better after trigger point and adustment last visit.   Medications patient has been prescribed: zanaflex  Taking:         Reviewed prior external information including notes and imaging from previsou exam, outside providers and external EMR if available.   As well as notes that were available from care everywhere and other healthcare systems.  Past medical history, social, surgical and family history all reviewed in electronic medical record.  No pertanent information unless stated regarding to the chief complaint.   Past Medical History:  Diagnosis Date   Carotid stenosis, bilateral 10/07/2022   GERD (gastroesophageal reflux disease) 2018   Hypercholesteremia    Hyperthyroidism    Migraines    with asthma   Slipping rib syndrome    Well woman exam with routine gynecological exam 12/06/2022    Allergies  Allergen Reactions   Topamax [Topiramate]     Pain in legs, SOB, and almost passed out     Review of Systems:  No headache, visual changes, nausea, vomiting, diarrhea, constipation, dizziness, abdominal pain, skin rash, fevers, chills, night sweats, weight loss, swollen lymph nodes, body aches, joint swelling, chest pain, shortness of breath, mood changes. POSITIVE muscle aches  Objective  Blood pressure 118/82, pulse 84, height 5\' 7"  (1.702 m), weight 169 lb (76.7 kg), SpO2 99%.   General: No apparent distress alert and oriented x3 mood and affect normal, dressed appropriately.  HEENT: Pupils equal, extraocular movements intact   Respiratory: Patient's speak in full sentences and does not appear short of breath  Cardiovascular: No lower extremity edema, non tender, no erythema  Mild tightness noted in the parascapular area.  Negative straight leg test noted.  Tightness with Pearlean Brownie right greater than left.  Osteopathic findings  C4 flexed rotated and side bent left T4 extended rotated and side bent right inhaled rib L1 flexed rotated and side bent right L3 flexed rotated and side bent left Sacrum right on right    Assessment and Plan:  Trigger point of shoulder region, right Significant improvement noted.  Continue to monitor.  Slipped rib syndrome Has had tightness previously.  Discussed posture and ergonomics, discussed which activities to do and which ones to avoid.  Increase activity slowly otherwise.  Discussed icing regimen and home exercises.  Follow-up again in 6 to 8 weeks otherwise.    Nonallopathic problems  Decision today to treat with OMT was based on Physical Exam  After verbal consent patient was treated with HVLA, ME, FPR techniques in cervical, rib, thoracic, lumbar, and sacral  areas  Patient tolerated the procedure well with improvement in symptoms  Patient given exercises, stretches and lifestyle modifications  See medications in patient instructions if given  Patient will follow up in 4-8 weeks     The above documentation has been reviewed and is accurate and complete Judi Saa, DO         Note: This dictation was prepared with Dragon dictation along with smaller phrase technology. Any transcriptional errors that result from this  process are unintentional.

## 2023-04-21 ENCOUNTER — Ambulatory Visit (INDEPENDENT_AMBULATORY_CARE_PROVIDER_SITE_OTHER): Payer: BC Managed Care – PPO | Admitting: Family Medicine

## 2023-04-21 ENCOUNTER — Encounter: Payer: Self-pay | Admitting: Family Medicine

## 2023-04-21 VITALS — BP 118/82 | HR 84 | Ht 67.0 in | Wt 169.0 lb

## 2023-04-21 DIAGNOSIS — M9908 Segmental and somatic dysfunction of rib cage: Secondary | ICD-10-CM | POA: Diagnosis not present

## 2023-04-21 DIAGNOSIS — M9901 Segmental and somatic dysfunction of cervical region: Secondary | ICD-10-CM | POA: Diagnosis not present

## 2023-04-21 DIAGNOSIS — M9903 Segmental and somatic dysfunction of lumbar region: Secondary | ICD-10-CM | POA: Diagnosis not present

## 2023-04-21 DIAGNOSIS — M25511 Pain in right shoulder: Secondary | ICD-10-CM

## 2023-04-21 DIAGNOSIS — M9902 Segmental and somatic dysfunction of thoracic region: Secondary | ICD-10-CM | POA: Diagnosis not present

## 2023-04-21 DIAGNOSIS — M940 Chondrocostal junction syndrome [Tietze]: Secondary | ICD-10-CM | POA: Diagnosis not present

## 2023-04-21 DIAGNOSIS — M9904 Segmental and somatic dysfunction of sacral region: Secondary | ICD-10-CM

## 2023-04-21 NOTE — Patient Instructions (Signed)
 Thanks for making me laugh Good to see you!  Good luck with the stairs See you again in 2 months

## 2023-04-21 NOTE — Assessment & Plan Note (Signed)
 Has had tightness previously.  Discussed posture and ergonomics, discussed which activities to do and which ones to avoid.  Increase activity slowly otherwise.  Discussed icing regimen and home exercises.  Follow-up again in 6 to 8 weeks otherwise.

## 2023-04-21 NOTE — Assessment & Plan Note (Signed)
 Significant improvement noted.  Continue to monitor.

## 2023-05-04 DIAGNOSIS — H93293 Other abnormal auditory perceptions, bilateral: Secondary | ICD-10-CM | POA: Diagnosis not present

## 2023-05-04 DIAGNOSIS — J358 Other chronic diseases of tonsils and adenoids: Secondary | ICD-10-CM | POA: Diagnosis not present

## 2023-05-04 DIAGNOSIS — R42 Dizziness and giddiness: Secondary | ICD-10-CM | POA: Diagnosis not present

## 2023-05-04 DIAGNOSIS — H6123 Impacted cerumen, bilateral: Secondary | ICD-10-CM | POA: Diagnosis not present

## 2023-05-23 DIAGNOSIS — R42 Dizziness and giddiness: Secondary | ICD-10-CM | POA: Diagnosis not present

## 2023-05-23 DIAGNOSIS — G43409 Hemiplegic migraine, not intractable, without status migrainosus: Secondary | ICD-10-CM | POA: Diagnosis not present

## 2023-05-25 ENCOUNTER — Encounter: Payer: Self-pay | Admitting: Obstetrics and Gynecology

## 2023-05-25 ENCOUNTER — Other Ambulatory Visit

## 2023-05-25 ENCOUNTER — Ambulatory Visit
Admission: RE | Admit: 2023-05-25 | Discharge: 2023-05-25 | Disposition: A | Discharge status: Home / Self Care | Source: Ambulatory Visit | Attending: Obstetrics and Gynecology | Admitting: Obstetrics and Gynecology

## 2023-05-25 DIAGNOSIS — R921 Mammographic calcification found on diagnostic imaging of breast: Secondary | ICD-10-CM | POA: Diagnosis not present

## 2023-05-25 DIAGNOSIS — N6001 Solitary cyst of right breast: Secondary | ICD-10-CM

## 2023-06-06 ENCOUNTER — Encounter: Payer: Self-pay | Admitting: Obstetrics and Gynecology

## 2023-06-06 ENCOUNTER — Ambulatory Visit: Payer: Federal, State, Local not specified - PPO | Admitting: Obstetrics and Gynecology

## 2023-06-06 VITALS — BP 104/62 | HR 84 | Temp 97.9°F | Wt 168.6 lb

## 2023-06-06 DIAGNOSIS — Z9189 Other specified personal risk factors, not elsewhere classified: Secondary | ICD-10-CM | POA: Diagnosis not present

## 2023-06-06 DIAGNOSIS — Z803 Family history of malignant neoplasm of breast: Secondary | ICD-10-CM

## 2023-06-06 DIAGNOSIS — Z87898 Personal history of other specified conditions: Secondary | ICD-10-CM

## 2023-06-06 NOTE — Patient Instructions (Addendum)
 Will plan to start bilateral breast MRI next year for increased risk for breast cancer. If you would like to discuss further with your insurance, the CPT code for this diagnosis is: Z91.89.

## 2023-06-06 NOTE — Progress Notes (Addendum)
 53 y.o. G50P1021 female with fibrocystic breast with left abnormal mammogram with benign biopsies, family history of breast cancer (declines genetic testing), high risk of breast cancer (2025- Gail model 2.2% 51yr risk) here for f/u clinical breast exam. Married.  No LMP recorded. Patient is perimenopausal. Had a 4d cycle in April then had 1d of spotting in June and November No bleeding since November  Abnormal MMG 10/2022 > benign biopsy of left breast along upper inner quadrant x 2 05/25/2023 mammogram BI-RADS 2, density D Gail calculator completed today: 2.2%, 13.6%, 5 yr lifetime risk, respectively.  Patient at high risk for breast cancer in the next 5 years.  Having issues with vertigo.  Seeing an ENT has some additional testing scheduled for later this month.  Is curious if perimenopause could be contributing to her symptoms. Continues to decline genetic screening for breast cancer.  GYN HISTORY: Bilateral breast biopsies, left in 2024  OB History  Gravida Para Term Preterm AB Living  3 1 1  2 1   SAB IAB Ectopic Multiple Live Births  2    1    # Outcome Date GA Lbr Len/2nd Weight Sex Type Anes PTL Lv  3 SAB           2 SAB           1 Term     M Vag-Spont   LIV    Past Medical History:  Diagnosis Date   Carotid stenosis, bilateral 10/07/2022   GERD (gastroesophageal reflux disease) 2018   Hypercholesteremia    Hyperthyroidism    Migraines    with asthma   Slipping rib syndrome    Well woman exam with routine gynecological exam 12/06/2022    Past Surgical History:  Procedure Laterality Date   BREAST BIOPSY Right 06/26/2014   benign per patient   BREAST BIOPSY Left 11/24/2022   MM LT BREAST BX W LOC DEV EA AD LESION IMG BX SPEC STEREO GUIDE 11/24/2022 [facility]   BREAST BIOPSY Left 11/24/2022   MM LT BREAST BX W LOC DEV 1ST LESION IMAGE BX SPEC STEREO GUIDE 11/24/2022 [facility]   COLONOSCOPY  08/03/2012   in [place] Dr.Catalano-normal exam,melanosis    UPPER GASTROINTESTINAL ENDOSCOPY  09/23/2016    Current Outpatient Medications on File Prior to Visit  Medication Sig Dispense Refill   Calcium-Magnesium-Vitamin D  600-40-500 MG-MG-UNIT TB24      clotrimazole-betamethasone (LOTRISONE) cream Apply topically.     meclizine  (ANTIVERT ) 25 MG tablet Take 1 tablet (25 mg total) by mouth 3 (three) times daily as needed for dizziness. 30 tablet 0   Multiple Vitamin tablet Take by mouth.     Rimegepant Sulfate (NURTEC) 75 MG TBDP Take 75 mg by mouth.     tiZANidine  (ZANAFLEX ) 4 MG tablet Take 1 tablet (4 mg total) by mouth at bedtime. 30 tablet 0   VITAMIN D  PO Take 1 tablet by mouth daily.     No current facility-administered medications on file prior to visit.   Allergies  Allergen Reactions   Topamax  [Topiramate ]     Pain in legs, SOB, and almost passed out    PE Today's Vitals   06/06/23 1036  BP: 104/62  Pulse: 84  Temp: 97.9 F (36.6 C)  TempSrc: Oral  SpO2: 98%  Weight: 168 lb 9.6 oz (76.5 kg)   Body mass index is 26.41 kg/m.  Physical Exam Vitals reviewed.  Constitutional:      General: She is not in acute distress.  Appearance: Normal appearance.  HENT:     Head: Normocephalic and atraumatic.     Nose: Nose normal.  Eyes:     Extraocular Movements: Extraocular movements intact.     Conjunctiva/sclera: Conjunctivae normal.  Pulmonary:     Effort: Pulmonary effort is normal.  Chest:  Breasts:    Right: Normal. No mass, nipple discharge, skin change or tenderness.     Left: Normal. No mass, nipple discharge, skin change or tenderness.  Musculoskeletal:        General: Normal range of motion.     Cervical back: Normal range of motion.  Lymphadenopathy:     Upper Body:     Right upper body: No axillary adenopathy.     Left upper body: No axillary adenopathy.  Neurological:     General: No focal deficit present.     Mental Status: She is alert.  Psychiatric:        Mood and Affect: Mood normal.         Behavior: Behavior normal.   Offered chaperone, patient declined.  Romaine Closs, MD  Assessment and Plan:        History of abnormal mammogram Family history of breast cancer At risk for breast cancer  Benign left breast biopsies October 2024. History of benign biopsies of right breast.   Normal recent follow-up mammogram and breast exam today.   Gregary Lean model with increased 5-year risk of breast cancer at 2.2% Declines genetic testing. Recommend annual breast MRI given category D breast density on mammogram with high risk of breast cancer. Next mammogram due October 2025. Plan for breast MRI 6 months following. Patient agreement.  All questions answered.  Discussed that vertigo can be a symptom of perimenopause.  Continue management and follow-up with ENT.  Recommend follow-up after completion of workup.  Romaine Closs, MD

## 2023-06-15 DIAGNOSIS — R2689 Other abnormalities of gait and mobility: Secondary | ICD-10-CM | POA: Diagnosis not present

## 2023-06-15 DIAGNOSIS — R51 Headache with orthostatic component, not elsewhere classified: Secondary | ICD-10-CM | POA: Diagnosis not present

## 2023-06-15 DIAGNOSIS — H9313 Tinnitus, bilateral: Secondary | ICD-10-CM | POA: Diagnosis not present

## 2023-06-15 DIAGNOSIS — R42 Dizziness and giddiness: Secondary | ICD-10-CM | POA: Diagnosis not present

## 2023-06-17 NOTE — Progress Notes (Unsigned)
 [name] [address] [place] [address] Phone: [phone] Subjective:   Christina [name], am serving as a scribe for Dr. Ronnell Coins.  I'Christina seeing this patient by the request  of:  [name], Connye Delaine, NP  CC: Back and neck pain follow-up  [id number]  Christina [name] is a 53 y.o. female coming in with complaint of back and neck pain. OMT on 04/21/2023. Patient states doing well. No new concerns. Did have a question for you.  Medications patient has been prescribed: Zanaflex   Taking:         Reviewed prior external information including notes and imaging from previsou exam, outside providers and external EMR if available.   As well as notes that were available from care everywhere and other healthcare systems.  Past medical history, social, surgical and family history all reviewed in electronic medical record.  No pertanent information unless stated regarding to the chief complaint.   Past Medical History:  Diagnosis Date   Carotid stenosis, bilateral 10/07/2022   GERD (gastroesophageal reflux disease) 2018   Hypercholesteremia    Hyperthyroidism    Migraines    with asthma   Slipping rib syndrome    Well woman exam with routine gynecological exam 12/06/2022    Allergies  Allergen Reactions   Topamax  [Topiramate ]     Pain in legs, SOB, and almost passed out     Review of Systems:  No  visual changes, nausea, vomiting, diarrhea, constipation, dizziness, abdominal pain, skin rash, fevers, chills, night sweats, weight loss, swollen lymph nodes, body aches, joint swelling, chest pain, shortness of breath, mood changes. POSITIVE muscle aches, headache  Objective  Blood pressure 122/74, pulse 72, height 5\' 7"  (1.702 Christina), weight 170 lb (77.1 kg), SpO2 98%.   General: No apparent distress alert and oriented x3 mood and affect normal, dressed appropriately.  HEENT: Pupils equal, extraocular movements intact  Respiratory:  Patient's speak in full sentences and does not appear short of breath  Cardiovascular: No lower extremity edema, non tender, no erythema  Gait relatively normal MSK:  Back low back significantly tighter than usual.  Significant tightness also noted at the occipital area of the head.  Patient has a negative Spurling's.  Worsening pain with extension.  Some tightness noted in the thoracolumbar and lumbosacral areas as well.  Osteopathic findings  C3 flexed rotated and side bent left C6 flexed rotated and side bent left T3 extended rotated and side bent right inhaled rib T9 extended rotated and side bent left L2 flexed rotated and side bent right Sacrum right on right       Assessment and Plan:  Neck pain Chronic problem with exacerbation, discussed icing regimen and home exercises, discussed which activities to do and which ones to avoid.  Increase activity slowly.  Toradol  and Depo-Medrol  given today secondary to the discomfort.  Worsening headache to seek medical attention.  Follow-up with me again in 4 to 6 weeks    Nonallopathic problems  Decision today to treat with OMT was based on Physical Exam  After verbal consent patient was treated with HVLA, ME, FPR techniques in cervical, rib, thoracic, lumbar, and sacral  areas avoided HVLA on the neck  Patient tolerated the procedure well with improvement in symptoms  Patient given exercises, stretches and lifestyle modifications  See medications in patient instructions if given  Patient will follow up in 4-8 weeks    The above documentation has been reviewed and is accurate and  complete Christina [name] Christina Inola Lisle, DO          Note: This dictation was prepared with Dragon dictation along with smaller phrase technology. Any transcriptional errors that result from this process are unintentional.

## 2023-06-22 ENCOUNTER — Encounter: Payer: Self-pay | Admitting: Family Medicine

## 2023-06-22 ENCOUNTER — Ambulatory Visit (INDEPENDENT_AMBULATORY_CARE_PROVIDER_SITE_OTHER): Admitting: Family Medicine

## 2023-06-22 VITALS — BP 122/74 | HR 72 | Ht 67.0 in | Wt 170.0 lb

## 2023-06-22 DIAGNOSIS — M9902 Segmental and somatic dysfunction of thoracic region: Secondary | ICD-10-CM

## 2023-06-22 DIAGNOSIS — M9901 Segmental and somatic dysfunction of cervical region: Secondary | ICD-10-CM | POA: Diagnosis not present

## 2023-06-22 DIAGNOSIS — M9908 Segmental and somatic dysfunction of rib cage: Secondary | ICD-10-CM

## 2023-06-22 DIAGNOSIS — M9903 Segmental and somatic dysfunction of lumbar region: Secondary | ICD-10-CM | POA: Diagnosis not present

## 2023-06-22 DIAGNOSIS — M9904 Segmental and somatic dysfunction of sacral region: Secondary | ICD-10-CM | POA: Diagnosis not present

## 2023-06-22 DIAGNOSIS — M542 Cervicalgia: Secondary | ICD-10-CM | POA: Diagnosis not present

## 2023-06-22 MED ORDER — METHYLPREDNISOLONE ACETATE 80 MG/ML IJ SUSP
80.0000 mg | Freq: Once | INTRAMUSCULAR | Status: AC
  Administered 2023-06-22: 80 mg via INTRAMUSCULAR

## 2023-06-22 MED ORDER — KETOROLAC TROMETHAMINE 30 MG/ML IJ SOLN
60.0000 mg | Freq: Once | INTRAMUSCULAR | Status: AC
  Administered 2023-06-22: 60 mg via INTRAMUSCULAR

## 2023-06-22 NOTE — Patient Instructions (Signed)
 Cocktail injection today Good to see you! Keep the retirement countdown going See you again in 6 weeks

## 2023-06-22 NOTE — Assessment & Plan Note (Signed)
 Chronic problem with exacerbation, discussed icing regimen and home exercises, discussed which activities to do and which ones to avoid.  Increase activity slowly.  Toradol  and Depo-Medrol  given today secondary to the discomfort.  Worsening headache to seek medical attention.  Follow-up with me again in 4 to 6 weeks

## 2023-06-30 DIAGNOSIS — M542 Cervicalgia: Secondary | ICD-10-CM | POA: Diagnosis not present

## 2023-06-30 DIAGNOSIS — M62838 Other muscle spasm: Secondary | ICD-10-CM | POA: Diagnosis not present

## 2023-06-30 DIAGNOSIS — G4489 Other headache syndrome: Secondary | ICD-10-CM | POA: Diagnosis not present

## 2023-07-18 DIAGNOSIS — R519 Headache, unspecified: Secondary | ICD-10-CM | POA: Diagnosis not present

## 2023-07-18 DIAGNOSIS — R42 Dizziness and giddiness: Secondary | ICD-10-CM | POA: Diagnosis not present

## 2023-08-03 NOTE — Progress Notes (Unsigned)
  [name] [address] [place] [address] Phone: [phone] Subjective:   Christina [name] Christina [name] am a scribe for Dr. Claudene.   I'm seeing this patient by the request  of:  [name], [name], NP  CC: Back and neck pain  YEP:Dlagzrupcz  Christina [name] is a 53 y.o. female coming in with complaint of back and neck pain. OMT on 06/22/2023. Patient states that the pain got worse after last visit and she ended up in [facility]. The did X-rays. Today the back and neck are doing better.   Facet arthritis noted        Reviewed prior external information including notes and imaging from previsou exam, outside providers and external EMR if available.   As well as notes that were available from care everywhere and other healthcare systems.  Past medical history, social, surgical and family history all reviewed in electronic medical record.  No pertanent information unless stated regarding to the chief complaint.   Past Medical History:  Diagnosis Date   Carotid stenosis, bilateral 10/07/2022   GERD (gastroesophageal reflux disease) 2018   Hypercholesteremia    Hyperthyroidism    Migraines    with asthma   Slipping rib syndrome    Well woman exam with routine gynecological exam 12/06/2022    Allergies  Allergen Reactions   Topamax  [Topiramate ]     Pain in legs, SOB, and almost passed out     Review of Systems:  No headache, visual changes, nausea, vomiting, diarrhea, constipation, dizziness, abdominal pain, skin rash, fevers, chills, night sweats, weight loss, swollen lymph nodes, body aches, joint swelling, chest pain, shortness of breath, mood changes. POSITIVE muscle aches  Objective  There were no vitals taken for this visit.   General: No apparent distress alert and oriented x3 mood and affect normal, dressed appropriately.  HEENT: Pupils equal, extraocular movements intact  Respiratory: Patient's speak in full sentences and  does not appear short of breath  Cardiovascular: No lower extremity edema, non tender, no erythema  Gait MSK:  Back   Osteopathic findings  C2 flexed rotated and side bent right C6 flexed rotated and side bent left T3 extended rotated and side bent right inhaled rib T9 extended rotated and side bent left L2 flexed rotated and side bent right Sacrum right on right       Assessment and Plan:  No problem-specific Assessment & Plan notes found for this encounter.    Nonallopathic problems  Decision today to treat with OMT was based on Physical Exam  After verbal consent patient was treated with HVLA, ME, FPR techniques in cervical, rib, thoracic, lumbar, and sacral  areas  Patient tolerated the procedure well with improvement in symptoms  Patient given exercises, stretches and lifestyle modifications  See medications in patient instructions if given  Patient will follow up in 4-8 weeks     The above documentation has been reviewed and is accurate and complete Christina [name] M Christina Bertholf, DO         Note: This dictation was prepared with Dragon dictation along with smaller phrase technology. Any transcriptional errors that result from this process are unintentional.

## 2023-08-08 ENCOUNTER — Ambulatory Visit: Admitting: Family Medicine

## 2023-08-08 VITALS — BP 102/60 | HR 77 | Ht 67.0 in | Wt 169.8 lb

## 2023-08-08 DIAGNOSIS — M9904 Segmental and somatic dysfunction of sacral region: Secondary | ICD-10-CM

## 2023-08-08 DIAGNOSIS — M542 Cervicalgia: Secondary | ICD-10-CM

## 2023-08-08 DIAGNOSIS — M9908 Segmental and somatic dysfunction of rib cage: Secondary | ICD-10-CM

## 2023-08-08 DIAGNOSIS — M9901 Segmental and somatic dysfunction of cervical region: Secondary | ICD-10-CM

## 2023-08-08 DIAGNOSIS — M9903 Segmental and somatic dysfunction of lumbar region: Secondary | ICD-10-CM | POA: Diagnosis not present

## 2023-08-08 DIAGNOSIS — M9902 Segmental and somatic dysfunction of thoracic region: Secondary | ICD-10-CM | POA: Diagnosis not present

## 2023-08-08 NOTE — Assessment & Plan Note (Signed)
 Chronic with exacerbation had to go to urgent care.  X-rays did not show anything that may need to concern.  No worsening pain no I do feel advanced imaging would be warranted.  Discussed icing regimen of home exercises.  Follow-up again 6 to 8 weeks.  Discussed the possibility of Toradol  and Depo-Medrol  if needed in the interim.  Discussed potentially prednisone  as well if needed.

## 2023-08-08 NOTE — Patient Instructions (Signed)
 Good to see you.  Yoga wheel would be good.  Hold on any new medication Take muscle relaxer at night Can consider MRI of the neck if needed.  See me again in 6 to 8 weeks.

## 2023-08-09 ENCOUNTER — Encounter: Payer: Self-pay | Admitting: Family Medicine

## 2023-09-28 ENCOUNTER — Ambulatory Visit: Admitting: Nurse Practitioner

## 2023-09-28 ENCOUNTER — Encounter: Payer: Self-pay | Admitting: Nurse Practitioner

## 2023-09-28 VITALS — BP 108/72 | HR 71 | Temp 97.6°F | Ht 67.0 in | Wt 163.0 lb

## 2023-09-28 DIAGNOSIS — K5900 Constipation, unspecified: Secondary | ICD-10-CM | POA: Diagnosis not present

## 2023-09-28 MED ORDER — LACTULOSE 10 GM/15ML PO SOLN: 30.0000 g | Freq: Every day | ORAL | 0 refills | Status: DC | PRN

## 2023-09-28 NOTE — Assessment & Plan Note (Addendum)
 08/15/2023: episode of diarrhea which lasted 2days, resolved with BRAT diet and increase oral hydration. This was accompanied by hard stools with mucus. 09/17/2023: another episode of diarrhea, then accompanied with hard stool and mucus till today. Also reports abdominal bloating. No rectal pain, no blood in stool, no nausea/vomiting No OVER THE COUNTER med used. Last colonoscopy 2021: no diverticulosis, polyps removed, due to repeat 2026 Hx of colitis 2024: resolved with augmentin . CT ABDOMEN/pelvis-Colitis extending from the mid transverse colon through the sigmoid colon, likely infectious or inflammatory.  Sent lactulose : Start lactulose  30g daily x 3days, if no bowell movement, call office. Switch to miralax 17g daily after regular bowel movement with lactulose . Maintain adequate oral hydration and high fiber diet. Avoid diary and highly processed foods Maintain appointment with GI scheduled in October

## 2023-09-28 NOTE — Patient Instructions (Addendum)
 Start lactulose  30g daily x 3days, if no bowell movement, call office. Switch to miralax 17g daily after regular bowel movement with lactulose . Maintain adequate oral hydration and high fiber diet. Avoid diary and highly processed foods Maintain appointment with GI  Constipation, Adult Constipation is when a person has trouble pooping (having a bowel movement). When you have this condition, you may poop fewer than 3 times a week. Your poop (stool) may also be dry, hard, or bigger than normal. Follow these instructions at home: Eating and drinking  Eat foods that have a lot of fiber, such as: Fresh fruits and vegetables. Whole grains. Beans. Eat less of foods that are low in fiber and high in fat and sugar, such as: [place] fries. Hamburgers. Cookies. Candy. Soda. Drink enough fluid to keep your pee (urine) pale yellow. General instructions Exercise regularly or as told by your doctor. Try to do 150 minutes of exercise each week. Go to the restroom when you feel like you need to poop. Do not hold it in. Take over-the-counter and prescription medicines only as told by your doctor. These include any fiber supplements. When you poop: Do deep breathing while relaxing your lower belly (abdomen). Relax your pelvic floor. The pelvic floor is a group of muscles that support the rectum, bladder, and intestines (as well as the uterus in women). Watch your condition for any changes. Tell your doctor if you notice any. Keep all follow-up visits as told by your doctor. This is important. Contact a doctor if: You have pain that gets worse. You have a fever. You have not pooped for 4 days. You vomit. You are not hungry. You lose weight. You are bleeding from the opening of the butt (anus). You have thin, pencil-like poop. Get help right away if: You have a fever, and your symptoms suddenly get worse. You leak poop or have blood in your poop. Your belly feels hard or bigger than normal  (bloated). You have very bad belly pain. You feel dizzy or you faint. Summary Constipation is when a person poops fewer than 3 times a week, has trouble pooping, or has poop that is dry, hard, or bigger than normal. Eat foods that have a lot of fiber. Drink enough fluid to keep your pee (urine) pale yellow. Take over-the-counter and prescription medicines only as told by your doctor. These include any fiber supplements. This information is not intended to replace advice given to you by your health care provider. Make sure you discuss any questions you have with your health care provider. Document Revised: 11/25/2021 Document Reviewed: 11/25/2021 Elsevier Patient Education  2024 [employer].

## 2023-09-28 NOTE — Progress Notes (Signed)
 Established Patient Visit  Patient: Christina [name]   DOB: May 21, 1970   53 y.o. Female  MRN: [id number] Visit Date: 09/28/2023  Subjective:    Chief Complaint  Patient presents with   GI Problem    Started with a diarrhea episode in July that lasted for 2 days and again in August with other symptoms like mucus in stool    Constipation 08/15/2023: episode of diarrhea which lasted 2days, resolved with BRAT diet and increase oral hydration. This was accompanied by hard stools with mucus. 09/17/2023: another episode of diarrhea, then accompanied with hard stool and mucus till today. Also reports abdominal bloating. No rectal pain, no blood in stool, no nausea/vomiting No OVER THE COUNTER med used. Last colonoscopy 2021: no diverticulosis, polyps removed, due to repeat 2026 Hx of colitis 2024: resolved with augmentin . CT ABDOMEN/pelvis-Colitis extending from the mid transverse colon through the sigmoid colon, likely infectious or inflammatory.  Sent lactulose : Start lactulose  30g daily x 3days, if no bowell movement, call office. Switch to miralax 17g daily after regular bowel movement with lactulose . Maintain adequate oral hydration and high fiber diet. Avoid diary and highly processed foods Maintain appointment with GI scheduled in October  Reviewed medical, surgical, and social history today  Medications: Outpatient Medications Prior to Visit  Medication Sig   Calcium-Magnesium-Vitamin D  600-40-500 MG-MG-UNIT TB24    clotrimazole-betamethasone (LOTRISONE) cream Apply topically.   meclizine  (ANTIVERT ) 25 MG tablet Take 1 tablet (25 mg total) by mouth 3 (three) times daily as needed for dizziness.   Multiple Vitamin tablet Take by mouth.   Rimegepant Sulfate (NURTEC) 75 MG TBDP Take 75 mg by mouth.   tiZANidine  (ZANAFLEX ) 4 MG tablet Take 1 tablet (4 mg total) by mouth at bedtime.   VITAMIN D  PO Take 1 tablet by mouth daily.   No facility-administered medications  prior to visit.   Reviewed past medical and social history.   ROS per HPI above      Objective:  BP 108/72 (BP Location: Left Arm, Patient Position: Sitting, Cuff Size: Large)   Pulse 71   Temp 97.6 F (36.4 C) (Oral)   Ht 5' 7 (1.702 m)   Wt 163 lb (73.9 kg)   SpO2 97%   BMI 25.53 kg/m      Physical Exam Vitals and nursing note reviewed.  Abdominal:     General: There is no distension.     Palpations: Abdomen is soft.     Tenderness: There is abdominal tenderness in the left lower quadrant. There is no right CVA tenderness, left CVA tenderness, guarding or rebound.  Neurological:     Mental Status: She is alert and oriented to person, place, and time.     No results found for any visits on 09/28/23.    Assessment & Plan:    Problem List Items Addressed This Visit     Constipation - Primary   08/15/2023: episode of diarrhea which lasted 2days, resolved with BRAT diet and increase oral hydration. This was accompanied by hard stools with mucus. 09/17/2023: another episode of diarrhea, then accompanied with hard stool and mucus till today. Also reports abdominal bloating. No rectal pain, no blood in stool, no nausea/vomiting No OVER THE COUNTER med used. Last colonoscopy 2021: no diverticulosis, polyps removed, due to repeat 2026 Hx of colitis 2024: resolved with augmentin . CT ABDOMEN/pelvis-Colitis extending from the mid transverse colon through the sigmoid colon, likely infectious  or inflammatory.  Sent lactulose : Start lactulose  30g daily x 3days, if no bowell movement, call office. Switch to miralax 17g daily after regular bowel movement with lactulose . Maintain adequate oral hydration and high fiber diet. Avoid diary and highly processed foods Maintain appointment with GI scheduled in October      Relevant Medications   lactulose  (CHRONULAC ) 10 GM/15ML solution   Return if symptoms worsen or fail to improve.     Roselie Mood, NP

## 2023-09-29 NOTE — Progress Notes (Signed)
 [name] [address] [place] [address] Phone: [phone] Subjective:   LILLETTE Berwyn Posey, am serving as a scribe for Dr. Arthea Claudene.  I'm seeing this patient by the request  of:  [name], [name], NP  CC: Back and neck pain follow-up  YEP:Dlagzrupcz  Christina [name] is a 53 y.o. female coming in with complaint of back and neck pain. OMT on 08/08/2023. Patient states that for the past week she has been having R scapula pain. Otherwise doing ok. m  Medications patient has been prescribed:   Taking:         Reviewed prior external information including notes and imaging from previsou exam, outside providers and external EMR if available.   As well as notes that were available from care everywhere and other healthcare systems.  Past medical history, social, surgical and family history all reviewed in electronic medical record.  No pertanent information unless stated regarding to the chief complaint.   Past Medical History:  Diagnosis Date   Carotid stenosis, bilateral 10/07/2022   GERD (gastroesophageal reflux disease) 2018   Hypercholesteremia    Hyperthyroidism    Migraines    with asthma   Slipping rib syndrome    Well woman exam with routine gynecological exam 12/06/2022    Allergies  Allergen Reactions   Topamax  [Topiramate ]     Pain in legs, SOB, and almost passed out     Review of Systems:  No headache, visual changes, nausea, vomiting, diarrhea, constipation, dizziness, abdominal pain, skin rash, fevers, chills, night sweats, weight loss, swollen lymph nodes, body aches, joint swelling, chest pain, shortness of breath, mood changes. POSITIVE muscle aches  Objective  Blood pressure 110/78, height 5' 7 (1.702 m).   General: No apparent distress alert and oriented x3 mood and affect normal, dressed appropriately.  HEENT: Pupils equal, extraocular movements intact  Respiratory: Patient's speak in full sentences  and does not appear short of breath  Cardiovascular: No lower extremity edema, non tender, no erythema  Gait relatively normal MSK:  Back Left has had increasing discomfort on exam today.  Significant is noted in the multiple trigger points noted.  Osteopathic findings  C3 flexed rotated and side bent right C7 flexed rotated and side bent left T5 extended rotated and side bent right inhaled rib T9 extended rotated and side bent left L2 flexed rotated and side bent right Sacrum right on right    Assessment and Plan:  Trigger point of shoulder region, right Acute exacerbation.  Discussed the potential for injections again.  Patient wants to hold.  Did feel like she responded well to osteopathic manipulation.  Differential includes cervical radiculopathy but I think it is less likely.  Discussed icing regimen and home exercises, discussed which activities to do and which ones to avoid.  Increase activity slowly.  Follow-up again in 6 to 8 weeks.    Nonallopathic problems  Decision today to treat with OMT was based on Physical Exam  After verbal consent patient was treated with HVLA, ME, FPR techniques in cervical, rib, thoracic, lumbar, and sacral  areas  Patient tolerated the procedure well with improvement in symptoms  Patient given exercises, stretches and lifestyle modifications  See medications in patient instructions if given  Patient will follow up in 4-8 weeks     The above documentation has been reviewed and is accurate and complete Nekia Maxham M Jamoni Broadfoot, DO         Note: This dictation was  prepared with Dragon dictation along with smaller phrase technology. Any transcriptional errors that result from this process are unintentional.

## 2023-10-03 ENCOUNTER — Encounter: Payer: Self-pay | Admitting: Family Medicine

## 2023-10-03 ENCOUNTER — Ambulatory Visit (INDEPENDENT_AMBULATORY_CARE_PROVIDER_SITE_OTHER): Admitting: Family Medicine

## 2023-10-03 VITALS — BP 110/78 | Ht 67.0 in

## 2023-10-03 DIAGNOSIS — M533 Sacrococcygeal disorders, not elsewhere classified: Secondary | ICD-10-CM | POA: Diagnosis not present

## 2023-10-03 DIAGNOSIS — M9903 Segmental and somatic dysfunction of lumbar region: Secondary | ICD-10-CM | POA: Diagnosis not present

## 2023-10-03 DIAGNOSIS — M9908 Segmental and somatic dysfunction of rib cage: Secondary | ICD-10-CM

## 2023-10-03 DIAGNOSIS — M9901 Segmental and somatic dysfunction of cervical region: Secondary | ICD-10-CM

## 2023-10-03 DIAGNOSIS — M25511 Pain in right shoulder: Secondary | ICD-10-CM

## 2023-10-03 DIAGNOSIS — M9902 Segmental and somatic dysfunction of thoracic region: Secondary | ICD-10-CM

## 2023-10-03 DIAGNOSIS — M940 Chondrocostal junction syndrome [Tietze]: Secondary | ICD-10-CM

## 2023-10-03 DIAGNOSIS — M9904 Segmental and somatic dysfunction of sacral region: Secondary | ICD-10-CM

## 2023-10-03 NOTE — Assessment & Plan Note (Signed)
 Acute exacerbation.  Discussed the potential for injections again.  Patient wants to hold.  Did feel like she responded well to osteopathic manipulation.  Differential includes cervical radiculopathy but I think it is less likely.  Discussed icing regimen and home exercises, discussed which activities to do and which ones to avoid.  Increase activity slowly.  Follow-up again in 6 to 8 weeks.

## 2023-10-03 NOTE — Assessment & Plan Note (Signed)
 Discussed icing regimen and home exercises.  Discussed which activities to do and which ones to avoid.  Increase activity slowly.  Follow-up again in 6 to 8 weeks.

## 2023-10-03 NOTE — Assessment & Plan Note (Signed)
 Tightness noted.  Discussed icing regimen and home exercises, discussed which activities to do and which ones to avoid.  Increase activity slowly.  Discussed icing regimen.  Follow-up again in 6 to 8 weeks.

## 2023-10-03 NOTE — Patient Instructions (Signed)
 Great to see you Good luck to [place] Add colace to regimen See me in 6-8 weeks

## 2023-10-10 ENCOUNTER — Telehealth: Payer: Self-pay

## 2023-10-10 ENCOUNTER — Ambulatory Visit: Admitting: Nurse Practitioner

## 2023-10-10 ENCOUNTER — Other Ambulatory Visit: Payer: Self-pay | Admitting: Nurse Practitioner

## 2023-10-10 ENCOUNTER — Encounter: Payer: Self-pay | Admitting: Nurse Practitioner

## 2023-10-10 VITALS — BP 114/76 | HR 72 | Temp 97.4°F | Ht 67.0 in | Wt 164.2 lb

## 2023-10-10 DIAGNOSIS — Z1231 Encounter for screening mammogram for malignant neoplasm of breast: Secondary | ICD-10-CM

## 2023-10-10 DIAGNOSIS — K5900 Constipation, unspecified: Secondary | ICD-10-CM

## 2023-10-10 MED ORDER — LUBIPROSTONE 8 MCG PO CAPS: 8.0000 ug | ORAL_CAPSULE | Freq: Two times a day (BID) | ORAL | 5 refills | Status: DC

## 2023-10-10 NOTE — Patient Instructions (Signed)
 Stop miralax and colace Start amitiza  as precribed Maintain a high fiber diet and adequate oral hydration  Constipation, Adult Constipation is when a person has trouble pooping (having a bowel movement). When you have this condition, you may poop fewer than 3 times a week. Your poop (stool) may also be dry, hard, or bigger than normal. Follow these instructions at home: Eating and drinking  Eat foods that have a lot of fiber, such as: Fresh fruits and vegetables. Whole grains. Beans. Eat less of foods that are low in fiber and high in fat and sugar, such as: [place] fries. Hamburgers. Cookies. Candy. Soda. Drink enough fluid to keep your pee (urine) pale yellow. General instructions Exercise regularly or as told by your doctor. Try to do 150 minutes of exercise each week. Go to the restroom when you feel like you need to poop. Do not hold it in. Take over-the-counter and prescription medicines only as told by your doctor. These include any fiber supplements. When you poop: Do deep breathing while relaxing your lower belly (abdomen). Relax your pelvic floor. The pelvic floor is a group of muscles that support the rectum, bladder, and intestines (as well as the uterus in women). Watch your condition for any changes. Tell your doctor if you notice any. Keep all follow-up visits as told by your doctor. This is important. Contact a doctor if: You have pain that gets worse. You have a fever. You have not pooped for 4 days. You vomit. You are not hungry. You lose weight. You are bleeding from the opening of the butt (anus). You have thin, pencil-like poop. Get help right away if: You have a fever, and your symptoms suddenly get worse. You leak poop or have blood in your poop. Your belly feels hard or bigger than normal (bloated). You have very bad belly pain. You feel dizzy or you faint. Summary Constipation is when a person poops fewer than 3 times a week, has trouble pooping,  or has poop that is dry, hard, or bigger than normal. Eat foods that have a lot of fiber. Drink enough fluid to keep your pee (urine) pale yellow. Take over-the-counter and prescription medicines only as told by your doctor. These include any fiber supplements. This information is not intended to replace advice given to you by your health care provider. Make sure you discuss any questions you have with your health care provider. Document Revised: 11/25/2021 Document Reviewed: 11/25/2021 Elsevier Patient Education  2024 [employer].

## 2023-10-10 NOTE — Progress Notes (Signed)
                Established Patient Visit  Patient: Christina [name]   DOB: 1970/06/12   53 y.o. Female  MRN: [id number] Visit Date: 10/10/2023  Subjective:    Chief Complaint  Patient presents with   Follow-up    Still having constipation in addition to stool softener     HPI Constipation She was able to have a bowel movement with use of lactulose  x 2days. She took miralax from 9/6-9/12 with no bowel movement, she then took colace and ate cherries which led to a BM on 9/13 and 9/14. Report abdominal bloating and cramps with constipation.  Stop miralax and colace Start amitiza  8mcg 1-2x/day. Advised to maintain a high fiber diet and adequate oral hydration F/up with GI  Reviewed medical, surgical, and social history today  Medications: Outpatient Medications Prior to Visit  Medication Sig   Calcium-Magnesium-Vitamin D  600-40-500 MG-MG-UNIT TB24    clotrimazole-betamethasone (LOTRISONE) cream Apply topically.   lactulose  (CHRONULAC ) 10 GM/15ML solution Take 45 mLs (30 g total) by mouth daily as needed for severe constipation.   meclizine  (ANTIVERT ) 25 MG tablet Take 1 tablet (25 mg total) by mouth 3 (three) times daily as needed for dizziness.   Multiple Vitamin tablet Take by mouth.   Rimegepant Sulfate (NURTEC) 75 MG TBDP Take 75 mg by mouth.   tiZANidine  (ZANAFLEX ) 4 MG tablet Take 1 tablet (4 mg total) by mouth at bedtime.   VITAMIN D  PO Take 1 tablet by mouth daily.   No facility-administered medications prior to visit.   Reviewed past medical and social history.   ROS per HPI above      Objective:  BP 114/76 (BP Location: Left Arm, Patient Position: Sitting, Cuff Size: Large)   Pulse 72   Temp (!) 97.4 F (36.3 C) (Oral)   Ht 5' 7 (1.702 m)   Wt 164 lb 3.2 oz (74.5 kg)   SpO2 99%   BMI 25.72 kg/m      Physical Exam Vitals and nursing note reviewed.  Constitutional:      General: She is not in acute distress. Pulmonary:     Effort: Pulmonary effort is  normal.  Abdominal:     General: There is no distension.     Palpations: Abdomen is soft.     Tenderness: There is no abdominal tenderness.  Neurological:     Mental Status: She is alert and oriented to person, place, and time.     No results found for any visits on 10/10/23.    Assessment & Plan:    Problem List Items Addressed This Visit     Constipation - Primary   She was able to have a bowel movement with use of lactulose  x 2days. She took miralax from 9/6-9/12 with no bowel movement, she then took colace and ate cherries which led to a BM on 9/13 and 9/14. Report abdominal bloating and cramps with constipation.  Stop miralax and colace Start amitiza  8mcg 1-2x/day. Advised to maintain a high fiber diet and adequate oral hydration F/up with GI      Relevant Medications   lubiprostone  (AMITIZA ) 8 MCG capsule   Return if symptoms worsen or fail to improve.     Roselie Mood, NP

## 2023-10-10 NOTE — Assessment & Plan Note (Signed)
 She was able to have a bowel movement with use of lactulose  x 2days. She took miralax from 9/6-9/12 with no bowel movement, she then took colace and ate cherries which led to a BM on 9/13 and 9/14. Report abdominal bloating and cramps with constipation.  Stop miralax and colace Start amitiza  8mcg 1-2x/day. Advised to maintain a high fiber diet and adequate oral hydration F/up with GI

## 2023-10-10 NOTE — Telephone Encounter (Signed)
 Pharmacy Patient Advocate Encounter   Received notification from CoverMyMeds that prior authorization for Lubiprostone  capsules  is required/requested.   Insurance verification completed.   The patient is insured through [facility] .   Per test claim: PA required; PA submitted to above mentioned insurance via Latent Key/confirmation #/EOC [id number] Status is pending

## 2023-10-12 NOTE — Telephone Encounter (Signed)
 Pharmacy Patient Advocate Encounter  Received notification from [facility] that Prior Authorization for Lubiprostone  capsules  has been APPROVED from 09/11/2023 to 10/11/2023   PA #/Case ID/Reference #: [id number]

## 2023-11-10 ENCOUNTER — Encounter: Payer: Self-pay | Admitting: Internal Medicine

## 2023-11-10 ENCOUNTER — Ambulatory Visit: Admitting: Internal Medicine

## 2023-11-10 VITALS — BP 104/70 | HR 74 | Ht 67.0 in | Wt 161.0 lb

## 2023-11-10 DIAGNOSIS — R935 Abnormal findings on diagnostic imaging of other abdominal regions, including retroperitoneum: Secondary | ICD-10-CM

## 2023-11-10 DIAGNOSIS — Z800 Family history of malignant neoplasm of digestive organs: Secondary | ICD-10-CM

## 2023-11-10 DIAGNOSIS — K219 Gastro-esophageal reflux disease without esophagitis: Secondary | ICD-10-CM

## 2023-11-10 DIAGNOSIS — Z8719 Personal history of other diseases of the digestive system: Secondary | ICD-10-CM

## 2023-11-10 DIAGNOSIS — R194 Change in bowel habit: Secondary | ICD-10-CM | POA: Diagnosis not present

## 2023-11-10 MED ORDER — NA SULFATE-K SULFATE-MG SULF 17.5-3.13-1.6 GM/177ML PO SOLN: 1.0000 | Freq: Once | ORAL | 0 refills | Status: AC

## 2023-11-10 NOTE — Progress Notes (Signed)
 HISTORY OF PRESENT ILLNESS:  Christina [name] is a 53 y.o. 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  She presents today regarding change in bowel habits.  Patient reports that her usual bowel habits consist of movements 2-3 times per week.  On August 15, 2023 she developed 1 days worth of diarrhea.  Thereafter, no bowel movement for approximately 1 month.  She saw her PCP.  Lactulose  given.  This helped.  Converted to MiraLAX and Colace.  This did not help.  Subsequently given Amitiza .  Took for a while.  This helped.  Then stop.  Bowels now closer to baseline.  With change in bowel habits she had bloating, gas, and mucus.  Previous colonoscopy examinations were performed in 2019 and May 2021.  Exams are normal.  She is due for her follow-up exam in a few months.  Looking back in her chart, she went to the emergency room in June 2024 with right sided abdominal pain.  CT scan at that time revealed colitis involving the mid transverse through sigmoid region.  REVIEW OF SYSTEMS:  All non-GI ROS negative. Past Medical History:  Diagnosis Date   Carotid stenosis, bilateral 10/07/2022   GERD (gastroesophageal reflux disease) 2018   Hypercholesteremia    Hyperthyroidism    Migraines    with asthma   Slipping rib syndrome    Well woman exam with routine gynecological exam 12/06/2022    Past Surgical History:  Procedure Laterality Date   BREAST BIOPSY Right 06/26/2014   benign per patient   BREAST BIOPSY Left 11/24/2022   MM LT BREAST BX W LOC DEV EA AD LESION IMG BX SPEC STEREO GUIDE 11/24/2022 [facility]   BREAST BIOPSY Left 11/24/2022   MM LT BREAST BX W LOC DEV 1ST LESION IMAGE BX SPEC STEREO GUIDE 11/24/2022 [facility]   COLONOSCOPY  08/03/2012   in [place] Dr.Catalano-normal exam,melanosis   UPPER GASTROINTESTINAL ENDOSCOPY   09/23/2016    Social History Christina [name]  reports that she has never smoked. She has never used smokeless tobacco. She reports current alcohol use. She reports that she does not use drugs.  family history includes Breast cancer in her cousin and cousin; Breast cancer (age of onset: 84) in her mother; Cancer in her father and mother; Colon cancer in her maternal aunt and maternal aunt; Colon cancer (age of onset: 1) in her mother; Diabetes in her mother; Hypertension in her mother; Prostate cancer in her father.  Allergies  Allergen Reactions   Topamax  [Topiramate ]     Pain in legs, SOB, and almost passed out       PHYSICAL EXAMINATION: Vital signs: BP 104/70   Pulse 74   Ht 5' 7 (1.702 m)   Wt 161 lb (73 kg)   BMI 25.22 kg/m   Constitutional: generally well-appearing, no acute distress Psychiatric: alert and oriented x3, cooperative Eyes: extraocular movements intact, anicteric, conjunctiva pink Mouth: oral pharynx moist, no lesions Neck: supple no lymphadenopathy Cardiovascular: heart regular rate and rhythm, no murmur Lungs: clear to auscultation bilaterally Abdomen: soft, nontender, nondistended, no obvious ascites, no peritoneal signs, normal bowel sounds, no organomegaly Rectal: Deferred until colonoscopy Extremities: no clubbing, cyanosis, or lower extremity edema bilaterally Skin: no lesions on visible extremities Neuro: No focal deficits.  Nerves intact  ASSESSMENT:  1.  Change in bowel habits as described.  Currently negative  better 2.  Family history of colon cancer 3.  Prior colonoscopy examinations 2019 and 2021 (complaints of rectal bleeding at that time) were negative for neoplasia. 4.  GERD complicated by peptic stricture.  Prior EGD with dilation August 2020.  No regular reflux medication 5.  Abnormal CT scan of the colon June 2024  PLAN:  1.  Okay to use Amitiza  on demand 2.  Schedule colonoscopy to evaluate change in bowel habits in a patient  with prior abnormal CT scan of the colon and family history of colon cancer.The nature of the procedure, as well as the risks, benefits, and alternatives were carefully and thoroughly reviewed with the patient. Ample time for discussion and questions allowed. The patient understood, was satisfied, and agreed to proceed. 3.  Reflux precautions 4.  Acid suppressive medication on demand 5.  Ongoing general medical care with PCP. A total time of 45 minutes was spent preparing to see the patient, reviewing multiple records, obtaining comprehensive history, performing medically appropriate physical examination, counseling and educating the patient regarding the above listed issues, and documenting clinical information in the health record

## 2023-11-10 NOTE — Patient Instructions (Signed)
 You have been scheduled for a colonoscopy. Please follow written instructions given to you at your visit today.   If you use inhalers (even only as needed), please bring them with you on the day of your procedure.  DO NOT TAKE 7 DAYS PRIOR TO TEST- Trulicity (dulaglutide) Ozempic, Wegovy (semaglutide) Mounjaro (tirzepatide) Bydureon Bcise (exanatide extended release)  DO NOT TAKE 1 DAY PRIOR TO YOUR TEST Rybelsus (semaglutide) Adlyxin (lixisenatide) Victoza (liraglutide) Byetta (exanatide) ___________________________________________________________________________  _______________________________________________________  If your blood pressure at your visit was 140/90 or greater, please contact your primary care physician to follow up on this.  _______________________________________________________  If you are age 28 or older, your body mass index should be between 23-30. Your Body mass index is 25.22 kg/m. If this is out of the aforementioned range listed, please consider follow up with your Primary Care Provider.  If you are age 60 or younger, your body mass index should be between 19-25. Your Body mass index is 25.22 kg/m. If this is out of the aformentioned range listed, please consider follow up with your Primary Care Provider.   ________________________________________________________  The [facility] providers would like to encourage you to use MYCHART to communicate with providers for non-urgent requests or questions.  Due to long hold times on the telephone, sending your provider a message by [facility] may be a faster and more efficient way to get a response.  Please allow 48 business hours for a response.  Please remember that this is for non-urgent requests.  _______________________________________________________  [facility] is using a team-based approach to care.  Your team is made up of your doctor and two to three APPS. Our APPS (Nurse Practitioners and  Physician Assistants) work with your physician to ensure care continuity for you. They are fully qualified to address your health concerns and develop a treatment plan. They communicate directly with your gastroenterologist to care for you. Seeing the Advanced Practice Practitioners on your physician's team can help you by facilitating care more promptly, often allowing for earlier appointments, access to diagnostic testing, procedures, and other specialty referrals.

## 2023-11-10 NOTE — Progress Notes (Signed)
 Christina [name] [address] [place] [address] Phone: [phone] Subjective:   Christina [name], am serving as a scribe for Dr. Arthea [name].  I'Christina seeing this patient by the request  of:  [name], [name], NP  CC: Back and neck pain  YEP:Dlagzrupcz  Christina [name] is a 53 y.o. female coming in with complaint of back and neck pain. OMT on 10/03/2023. Patient states overall doing relatively well.  Symptoms.  Mild overall.  Nothing that stopping her from activity.  Medications patient has been prescribed:   Taking:      Has seen gastroenterology and is going to have a colonoscopy in the near future.   Reviewed prior external information including notes and imaging from previsou exam, outside providers and external EMR if available.   As well as notes that were available from care everywhere and other healthcare systems.  Past medical history, social, surgical and family history all reviewed in electronic medical record.  No pertanent information unless stated regarding to the chief complaint.   Past Medical History:  Diagnosis Date   Carotid stenosis, bilateral 10/07/2022   GERD (gastroesophageal reflux disease) 2018   Hypercholesteremia    Hyperthyroidism    Migraines    with asthma   Slipping rib syndrome    Well woman exam with routine gynecological exam 12/06/2022    Allergies  Allergen Reactions   Topamax  [Topiramate ]     Pain in legs, SOB, and almost passed out     Review of Systems:  No headache, visual changes, nausea, vomiting, diarrhea, constipation, dizziness, abdominal pain, skin rash, fevers, chills, night sweats, weight loss, swollen lymph nodes, body aches, joint swelling, chest pain, shortness of breath, mood changes. POSITIVE muscle aches  Objective  Blood pressure 106/72, pulse 76, height 5' 7 (1.702 Christina), weight 163 lb (73.9 kg), SpO2 97%.   General: No apparent distress alert and oriented x3 mood and affect  normal, dressed appropriately.  HEENT: Pupils equal, extraocular movements intact  Respiratory: Patient's speak in full sentences and does not appear short of breath  Cardiovascular: No lower extremity edema, non tender, no erythema  Gait MSK:  Back   Osteopathic findings  C2 flexed rotated and side bent right C6 flexed rotated and side bent left T4 extended rotated and side bent right inhaled rib T11 extended rotated and side bent left L2 flexed rotated and side bent right Sacrum right on right       Assessment and Plan:  Slipped rib syndrome   Stable.  Continues to have some of the slipped rib syndrome.  Discussed icing regimen and home exercises, discussed increasing activity, follow-up again in 6 to 8 weeks.  Left lumbar radiculopathy Discussed icing regimen and home exercises, discussed which activities to do and which ones to avoid.  Follow-up again in 6 to 8 weeks    Nonallopathic problems  Decision today to treat with OMT was based on Physical Exam  After verbal consent patient was treated with HVLA, ME, FPR techniques in cervical, rib, thoracic, lumbar, and sacral  areas  Patient tolerated the procedure well with improvement in symptoms  Patient given exercises, stretches and lifestyle modifications  See medications in patient instructions if given  Patient will follow up in 4-8 weeks     The above documentation has been reviewed and is accurate and complete Christina [name] Christina Chesley Valls, DO         Note: This dictation was prepared with Dragon dictation along  with smaller phrase technology. Any transcriptional errors that result from this process are unintentional.

## 2023-11-14 ENCOUNTER — Encounter: Payer: Self-pay | Admitting: Family Medicine

## 2023-11-14 ENCOUNTER — Ambulatory Visit (INDEPENDENT_AMBULATORY_CARE_PROVIDER_SITE_OTHER): Admitting: Family Medicine

## 2023-11-14 ENCOUNTER — Ambulatory Visit
Admission: RE | Admit: 2023-11-14 | Discharge: 2023-11-14 | Disposition: A | Source: Ambulatory Visit | Attending: Nurse Practitioner

## 2023-11-14 VITALS — BP 106/72 | HR 76 | Ht 67.0 in | Wt 163.0 lb

## 2023-11-14 DIAGNOSIS — M9901 Segmental and somatic dysfunction of cervical region: Secondary | ICD-10-CM

## 2023-11-14 DIAGNOSIS — M9908 Segmental and somatic dysfunction of rib cage: Secondary | ICD-10-CM | POA: Diagnosis not present

## 2023-11-14 DIAGNOSIS — Z1231 Encounter for screening mammogram for malignant neoplasm of breast: Secondary | ICD-10-CM | POA: Diagnosis not present

## 2023-11-14 DIAGNOSIS — M940 Chondrocostal junction syndrome [Tietze]: Secondary | ICD-10-CM | POA: Diagnosis not present

## 2023-11-14 DIAGNOSIS — M9903 Segmental and somatic dysfunction of lumbar region: Secondary | ICD-10-CM

## 2023-11-14 DIAGNOSIS — M5416 Radiculopathy, lumbar region: Secondary | ICD-10-CM | POA: Diagnosis not present

## 2023-11-14 DIAGNOSIS — M9904 Segmental and somatic dysfunction of sacral region: Secondary | ICD-10-CM

## 2023-11-14 DIAGNOSIS — M9902 Segmental and somatic dysfunction of thoracic region: Secondary | ICD-10-CM

## 2023-11-14 NOTE — Patient Instructions (Signed)
 Good to see you Stay active See me again in 2-79months

## 2023-11-14 NOTE — Assessment & Plan Note (Signed)
 Discussed icing regimen and home exercises, discussed which activities to do and which ones to avoid.  Follow-up again in 6 to 8 weeks

## 2023-11-14 NOTE — Assessment & Plan Note (Signed)
   Stable.  Continues to have some of the slipped rib syndrome.  Discussed icing regimen and home exercises, discussed increasing activity, follow-up again in 6 to 8 weeks.

## 2023-12-07 ENCOUNTER — Encounter: Payer: Self-pay | Admitting: Obstetrics and Gynecology

## 2023-12-07 ENCOUNTER — Ambulatory Visit (INDEPENDENT_AMBULATORY_CARE_PROVIDER_SITE_OTHER): Admitting: Obstetrics and Gynecology

## 2023-12-07 ENCOUNTER — Other Ambulatory Visit (HOSPITAL_COMMUNITY)
Admission: RE | Admit: 2023-12-07 | Discharge: 2023-12-07 | Disposition: A | Source: Ambulatory Visit | Attending: Obstetrics and Gynecology | Admitting: Obstetrics and Gynecology

## 2023-12-07 VITALS — BP 100/60 | HR 67 | Temp 97.6°F | Ht 68.0 in | Wt 161.0 lb

## 2023-12-07 DIAGNOSIS — Z9189 Other specified personal risk factors, not elsewhere classified: Secondary | ICD-10-CM | POA: Diagnosis not present

## 2023-12-07 DIAGNOSIS — Z1239 Encounter for other screening for malignant neoplasm of breast: Secondary | ICD-10-CM

## 2023-12-07 DIAGNOSIS — Z1331 Encounter for screening for depression: Secondary | ICD-10-CM | POA: Diagnosis not present

## 2023-12-07 DIAGNOSIS — Z124 Encounter for screening for malignant neoplasm of cervix: Secondary | ICD-10-CM

## 2023-12-07 DIAGNOSIS — Z780 Asymptomatic menopausal state: Secondary | ICD-10-CM | POA: Diagnosis not present

## 2023-12-07 DIAGNOSIS — Z01419 Encounter for gynecological examination (general) (routine) without abnormal findings: Secondary | ICD-10-CM | POA: Diagnosis not present

## 2023-12-07 NOTE — Patient Instructions (Signed)
 For patients under 50-54yo, I recommend 1200mg  calcium  daily and 600IU of vitamin D daily. For patients over 54yo, I recommend 1200mg  calcium  daily and 800IU of vitamin D daily.  Health Maintenance, Female Adopting a healthy lifestyle and getting preventive care are important in promoting health and wellness. Ask your health care provider about: The right schedule for you to have regular tests and exams. Things you can do on your own to prevent diseases and keep yourself healthy. What should I know about diet, weight, and exercise? Eat a healthy diet  Eat a diet that includes plenty of vegetables, fruits, low-fat dairy products, and lean protein. Do not eat a lot of foods that are high in solid fats, added sugars, or sodium. Maintain a healthy weight Body mass index (BMI) is used to identify weight problems. It estimates body fat based on height and weight. Your health care provider can help determine your BMI and help you achieve or maintain a healthy weight. Get regular exercise Get regular exercise. This is one of the most important things you can do for your health. Most adults should: Exercise for at least 150 minutes each week. The exercise should increase your heart rate and make you sweat (moderate-intensity exercise). Do strengthening exercises at least twice a week. This is in addition to the moderate-intensity exercise. Spend less time sitting. Even light physical activity can be beneficial. Watch cholesterol and blood lipids Have your blood tested for lipids and cholesterol at 54 years of age, then have this test every 5 years. Have your cholesterol levels checked more often if: Your lipid or cholesterol levels are high. You are older than 54 years of age. You are at high risk for heart disease. What should I know about cancer screening? Depending on your health history and family history, you may need to have cancer screening at various ages. This may include screening  for: Breast cancer. Cervical cancer. Colorectal cancer. Skin cancer. Lung cancer. What should I know about heart disease, diabetes, and high blood pressure? Blood pressure and heart disease High blood pressure causes heart disease and increases the risk of stroke. This is more likely to develop in people who have high blood pressure readings or are overweight. Have your blood pressure checked: Every 3-5 years if you are 25-57 years of age. Every year if you are 24 years old or older. Diabetes Have regular diabetes screenings. This checks your fasting blood sugar level. Have the screening done: Once every three years after age 62 if you are at a normal weight and have a low risk for diabetes. More often and at a younger age if you are overweight or have a high risk for diabetes. What should I know about preventing infection? Hepatitis B If you have a higher risk for hepatitis B, you should be screened for this virus. Talk with your health care provider to find out if you are at risk for hepatitis B infection. Hepatitis C Testing is recommended for: Everyone born from [birth year] through 1965. Anyone with known risk factors for hepatitis C. Sexually transmitted infections (STIs) Get screened for STIs, including gonorrhea and chlamydia, if: You are sexually active and are younger than 54 years of age. You are older than 54 years of age and your health care provider tells you that you are at risk for this type of infection. Your sexual activity has changed since you were last screened, and you are at increased risk for chlamydia or gonorrhea. Ask your health care provider if  you are at risk. Ask your health care provider about whether you are at high risk for HIV. Your health care provider may recommend a prescription medicine to help prevent HIV infection. If you choose to take medicine to prevent HIV, you should first get tested for HIV. You should then be tested every 3 months for as long as you  are taking the medicine. Osteoporosis and menopause Osteoporosis is a disease in which the bones lose minerals and strength with aging. This can result in bone fractures. If you are 72 years old or older, or if you are at risk for osteoporosis and fractures, ask your health care provider if you should: Be screened for bone loss. Take a calcium  or vitamin D supplement to lower your risk of fractures. Be given hormone replacement therapy (HRT) to treat symptoms of menopause. Follow these instructions at home: Alcohol use Do not drink alcohol if: Your health care provider tells you not to drink. You are pregnant, may be pregnant, or are planning to become pregnant. If you drink alcohol: Limit how much you have to: 0-1 drink a day. Know how much alcohol is in your drink. In the [place] one drink equals one 12 oz bottle of beer (355 mL), one 5 oz glass of wine (148 mL), or one 1 oz glass of hard liquor (44 mL). Lifestyle Do not use any products that contain nicotine or tobacco. These products include cigarettes, chewing tobacco, and vaping devices, such as e-cigarettes. If you need help quitting, ask your health care provider. Do not use street drugs. Do not share needles. Ask your health care provider for help if you need support or information about quitting drugs. General instructions Schedule regular health, dental, and eye exams. Stay current with your vaccines. Tell your health care provider if: You often feel depressed. You have ever been abused or do not feel safe at home. Summary Adopting a healthy lifestyle and getting preventive care are important in promoting health and wellness. Follow your health care provider's instructions about healthy diet, exercising, and getting tested or screened for diseases. Follow your health care provider's instructions on monitoring your cholesterol and blood pressure. This information is not intended to replace advice given to you by your health  care provider. Make sure you discuss any questions you have with your health care provider. Document Revised: 06/02/2020 Document Reviewed: 06/02/2020 Elsevier Patient Education  2024 [employer].

## 2023-12-07 NOTE — Assessment & Plan Note (Addendum)
 Benign left breast biopsies October 2024. History of benign biopsies of right breast. Christina [name] model with increased 5-year risk of breast cancer at 2.2% (06/06/23) Declines genetic testing. Recommend annual breast MRI given category D breast density on mammogram with high risk of breast cancer. MMG October 2025. Plan for breast MRI 6 months following. Request anxiolytic prior to MRI, will send in March 2026.

## 2023-12-07 NOTE — Assessment & Plan Note (Signed)
 Cervical cancer screening performed according to ASCCP guidelines. Encouraged annual mammogram and breast MRI  screening Colonoscopy UTD DXA N/A Labs and immunizations with her primary Encouraged safe sexual practices as indicated Encouraged healthy lifestyle practices with diet and exercise For patients under 50-54yo, I recommend 1200mg  calcium daily and 600IU of vitamin D  daily.

## 2023-12-07 NOTE — Progress Notes (Signed)
 53 y.o. G8P1021 female with fibrocystic breast with left abnormal mammogram with benign biopsies, family history of breast cancer (declines genetic testing), high risk of breast cancer (2025- Gail model 2.2% 70yr risk), known fibroids (4cm) here for annual exam. Married.  Works at post [employer]. PCP: Katheen [name], NP   Patient's last menstrual period was 11/26/2022 (approximate).  She is doing well. No additional bleeding since last year. Experiencing mild hot flashes, irritability.  Alisa calculator completed today: 2.2%, 13.6%, 5 yr lifetime risk, respectively.  Patient at high risk for breast cancer in the next 5 years.   Abnormal bleeding: irregular periods Pelvic discharge or pain: none Breast mass, nipple discharge or skin changes : firmness at biopsy site Birth control: none Last PAP:     Component Value Date/Time   DIAGPAP  08/29/2018 0000    NEGATIVE FOR INTRAEPITHELIAL LESIONS OR MALIGNANCY.   DIAGPAP  08/29/2018 0000    FUNGAL ORGANISMS PRESENT CONSISTENT WITH CANDIDA SPP.   DIAGPAP  07/29/2016 0000    NEGATIVE FOR INTRAEPITHELIAL LESIONS OR MALIGNANCY.   ADEQPAP  08/29/2018 0000    Satisfactory for evaluation  endocervical/transformation zone component PRESENT.   ADEQPAP  07/29/2016 0000    Satisfactory for evaluation  endocervical/transformation zone component PRESENT.   Last mammogram: 11/08/22 BIRADS 1, density D Last colonoscopy: 06/12/19, q5 years Sexually active: yes  Exercising: yes, walking  [employer] Visit from 12/07/2023 in [facility]  PHQ-2 Total Score 0   Flowsheet Row Office Visit from 12/20/2022 in [facility]  PHQ-9 Total Score 0    GYN HISTORY: Bilateral breast biopsies, left in 2024  OB History  Gravida Para Term Preterm AB Living  3 1 1  2 1   SAB IAB Ectopic Multiple Live Births  2    1    # Outcome Date GA Lbr Len/2nd Weight Sex Type Anes PTL Lv  3  SAB           2 SAB           1 Term     M Vag-Spont   LIV    Past Medical History:  Diagnosis Date   Carotid stenosis, bilateral 10/07/2022   GERD (gastroesophageal reflux disease) 2018   Hypercholesteremia    Hyperthyroidism    Migraines    with asthma   Slipping rib syndrome    Well woman exam with routine gynecological exam 12/06/2022    Past Surgical History:  Procedure Laterality Date   BREAST BIOPSY Right 06/26/2014   benign per patient   BREAST BIOPSY Left 11/24/2022   MM LT BREAST BX W LOC DEV EA AD LESION IMG BX SPEC STEREO GUIDE 11/24/2022 [facility]   BREAST BIOPSY Left 11/24/2022   MM LT BREAST BX W LOC DEV 1ST LESION IMAGE BX SPEC STEREO GUIDE 11/24/2022 [facility]   COLONOSCOPY  08/03/2012   in [place] Dr.Catalano-normal exam,melanosis   UPPER GASTROINTESTINAL ENDOSCOPY  09/23/2016    Current Outpatient Medications on File Prior to Visit  Medication Sig Dispense Refill   Calcium-Magnesium-Vitamin D  600-40-500 MG-MG-UNIT TB24      clotrimazole-betamethasone (LOTRISONE) cream Apply topically.     lubiprostone  (AMITIZA ) 8 MCG capsule Take 1 capsule (8 mcg total) by mouth 2 (two) times daily with a meal. 60 capsule 5   meclizine  (ANTIVERT ) 25 MG tablet Take 1 tablet (25 mg total) by mouth 3 (three) times daily as needed for dizziness. 30 tablet  0   Multiple Vitamin tablet Take by mouth.     tiZANidine  (ZANAFLEX ) 4 MG tablet Take 1 tablet (4 mg total) by mouth at bedtime. 30 tablet 0   VITAMIN D  PO Take 1 tablet by mouth daily.     Rimegepant Sulfate (NURTEC) 75 MG TBDP Take 75 mg by mouth. (Patient not taking: Reported on 12/07/2023)     No current facility-administered medications on file prior to visit.    Social History   Socioeconomic History   Marital status: Married    Spouse name: Not on file   Number of children: 1   Years of education: 14   Highest education level: Some college, no degree  Occupational History   Occupation: Biochemist, Clinical  Tobacco Use   Smoking status: Never   Smokeless tobacco: Never  Vaping Use   Vaping status: Never Used  Substance and Sexual Activity   Alcohol use: Not Currently   Drug use: No   Sexual activity: Not Currently    Partners: Male    Birth control/protection: None  Other Topics Concern   Not on file  Social History Narrative   Born and raised in [place] .    Currently resides in a house with her child. No pets. Fun: Go to the movies.    Denies religious beliefs effecting health care.    Social Drivers of Corporate Investment Banker Strain: Low Risk  (09/28/2023)   Overall Financial Resource Strain (CARDIA)    Difficulty of Paying Living Expenses: Not very hard  Food Insecurity: No Food Insecurity (09/28/2023)   Hunger Vital Sign    Worried About Running Out of Food in the Last Year: Never true    Ran Out of Food in the Last Year: Never true  Transportation Needs: No Transportation Needs (09/28/2023)   PRAPARE - Administrator, Civil Service (Medical): No    Lack of Transportation (Non-Medical): No  Physical Activity: Insufficiently Active (09/28/2023)   Exercise Vital Sign    Days of Exercise per Week: 3 days    Minutes of Exercise per Session: 10 min  Stress: No Stress Concern Present (09/28/2023)   [employer] of Occupational Health - Occupational Stress Questionnaire    Feeling of Stress: Not at all  Social Connections: Unknown (09/28/2023)   Social Connection and Isolation Panel    Frequency of Communication with Friends and Family: Patient declined    Frequency of Social Gatherings with Friends and Family: Patient declined    Attends Religious Services: Patient declined    Database Administrator or Organizations: Patient declined    Attends Banker Meetings: Not on file    Marital Status: Married  Intimate Partner Violence: Not At Risk (05/22/2023)   Received from [facility]   HITS    Over the last 12 months how often did your  partner physically hurt you?: Never    Over the last 12 months how often did your partner insult you or talk down to you?: Never    Over the last 12 months how often did your partner threaten you with physical harm?: Never    Over the last 12 months how often did your partner scream or curse at you?: Never    Family History  Problem Relation Age of Onset   Diabetes Mother    Hypertension Mother    Colon cancer Mother 79   Breast cancer Mother 20   Cancer Mother    Prostate cancer Father  Cancer Father    Colon cancer Maternal Aunt    Colon cancer Maternal Aunt    Breast cancer Cousin    Breast cancer Cousin    Stomach cancer Neg Hx    Esophageal cancer Neg Hx    Colon polyps Neg Hx    Rectal cancer Neg Hx     Allergies  Allergen Reactions   Topamax  [Topiramate ]     Pain in legs, SOB, and almost passed out      PE Today's Vitals   12/07/23 1137  BP: 100/60  Pulse: 67  Temp: 97.6 F (36.4 C)  TempSrc: Oral  SpO2: 98%  Weight: 161 lb (73 kg)  Height: 5' 8 (1.727 m)   Body mass index is 24.48 kg/m.  Physical Exam Vitals reviewed. Exam conducted with a chaperone present.  Constitutional:      General: She is not in acute distress.    Appearance: Normal appearance.  HENT:     Head: Normocephalic and atraumatic.     Nose: Nose normal.  Eyes:     Extraocular Movements: Extraocular movements intact.     Conjunctiva/sclera: Conjunctivae normal.  Neck:     Thyroid : No thyroid  mass, thyromegaly or thyroid  tenderness.  Pulmonary:     Effort: Pulmonary effort is normal.  Chest:     Chest wall: No mass or tenderness.  Breasts:    Right: Normal. No swelling, mass, nipple discharge, skin change or tenderness.     Left: Normal. No swelling, mass, nipple discharge, skin change or tenderness.  Abdominal:     General: There is no distension.     Palpations: Abdomen is soft.     Tenderness: There is no abdominal tenderness.  Genitourinary:    General: Normal  vulva.     Exam position: Lithotomy position.     Urethra: No prolapse.     Vagina: Normal. No vaginal discharge or bleeding.     Cervix: Normal. No lesion.     Uterus: Normal. Not enlarged and not tender.      Adnexa: Right adnexa normal and left adnexa normal.  Musculoskeletal:        General: Normal range of motion.     Cervical back: Normal range of motion.  Lymphadenopathy:     Upper Body:     Right upper body: No axillary adenopathy.     Left upper body: No axillary adenopathy.     Lower Body: No right inguinal adenopathy. No left inguinal adenopathy.  Skin:    General: Skin is warm and dry.  Neurological:     General: No focal deficit present.     Mental Status: She is alert.  Psychiatric:        Mood and Affect: Mood normal.        Behavior: Behavior normal.      Assessment and Plan:        Well woman exam with routine gynecological exam Assessment & Plan: Cervical cancer screening performed according to ASCCP guidelines. Encouraged annual mammogram and breast MRI screening Colonoscopy UTD DXA N/A Labs and immunizations with her primary Encouraged safe sexual practices as indicated Encouraged healthy lifestyle practices with diet and exercise For patients under 50-70yo, I recommend 1200mg  calcium daily and 600IU of vitamin D  daily.    Cervical cancer screening -     Cytology - PAP  Screening for depression  At risk for breast cancer -     MR BREAST BILATERAL W WO CONTRAST INC CAD; Future  Encounter  for breast cancer screening using non-mammogram modality -     MR BREAST BILATERAL W WO CONTRAST INC CAD; Future  Menopause  Anticipatory guidance provided  Vera LULLA Pa, MD

## 2023-12-09 ENCOUNTER — Encounter: Payer: Self-pay | Admitting: Internal Medicine

## 2023-12-09 ENCOUNTER — Ambulatory Visit: Admitting: Internal Medicine

## 2023-12-09 VITALS — BP 107/75 | HR 63 | Temp 97.7°F | Resp 11 | Wt 163.0 lb

## 2023-12-09 DIAGNOSIS — R194 Change in bowel habit: Secondary | ICD-10-CM

## 2023-12-09 DIAGNOSIS — R933 Abnormal findings on diagnostic imaging of other parts of digestive tract: Secondary | ICD-10-CM | POA: Diagnosis not present

## 2023-12-09 DIAGNOSIS — R935 Abnormal findings on diagnostic imaging of other abdominal regions, including retroperitoneum: Secondary | ICD-10-CM | POA: Diagnosis not present

## 2023-12-09 DIAGNOSIS — Z800 Family history of malignant neoplasm of digestive organs: Secondary | ICD-10-CM

## 2023-12-09 MED ORDER — SODIUM CHLORIDE 0.9 % IV SOLN: 500.0000 mL | Freq: Once | INTRAVENOUS | Status: DC

## 2023-12-09 NOTE — Patient Instructions (Signed)
 Resume previous diet.  Continue present medications.     YOU HAD AN ENDOSCOPIC PROCEDURE TODAY AT THE [facility]:   Refer to the procedure report that was given to you for any specific questions about what was found during the examination.  If the procedure report does not answer your questions, please call your gastroenterologist to clarify.  If you requested that your care partner not be given the details of your procedure findings, then the procedure report has been included in a sealed envelope for you to review at your convenience later.  YOU SHOULD EXPECT: Some feelings of bloating in the abdomen. Passage of more gas than usual.  Walking can help get rid of the air that was put into your GI tract during the procedure and reduce the bloating. If you had a lower endoscopy (such as a colonoscopy or flexible sigmoidoscopy) you may notice spotting of blood in your stool or on the toilet paper. If you underwent a bowel prep for your procedure, you may not have a normal bowel movement for a few days.  Please Note:  You might notice some irritation and congestion in your nose or some drainage.  This is from the oxygen used during your procedure.  There is no need for concern and it should clear up in a day or so.  SYMPTOMS TO REPORT IMMEDIATELY:  Following lower endoscopy (colonoscopy or flexible sigmoidoscopy):  Excessive amounts of blood in the stool  Significant tenderness or worsening of abdominal pains  Swelling of the abdomen that is new, acute  Fever of 100F or higher   For urgent or emergent issues, a gastroenterologist can be reached at any hour by calling [phone]. Do not use MyChart messaging for urgent concerns.    DIET:  We do recommend a small meal at first, but then you may proceed to your regular diet.  Drink plenty of fluids but you should avoid alcoholic beverages for 24 hours.  ACTIVITY:  You should plan to take it easy for the rest of today and you  should NOT DRIVE or use heavy machinery until tomorrow (because of the sedation medicines used during the test).    FOLLOW UP: Our staff will call the number listed on your records the next business day following your procedure.  We will call around 7:15- 8:00 am to check on you and address any questions or concerns that you may have regarding the information given to you following your procedure. If we do not reach you, we will leave a message.     If any biopsies were taken you will be contacted by phone or by letter within the next 1-3 weeks.  Please call us at [phone] if you have not heard about the biopsies in 3 weeks.    SIGNATURES/CONFIDENTIALITY: You and/or your care partner have signed paperwork which will be entered into your electronic medical record.  These signatures attest to the fact that that the information above on your After Visit Summary has been reviewed and is understood.  Full responsibility of the confidentiality of this discharge information lies with you and/or your care-partner.

## 2023-12-09 NOTE — Progress Notes (Signed)
 Pt's states no medical or surgical changes since previsit or office visit.

## 2023-12-09 NOTE — Progress Notes (Signed)
 Expand All Collapse All HISTORY OF PRESENT ILLNESS:   Christina [name] is a 53 y.o. 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  She presents today regarding change in bowel habits.   Patient reports that her usual bowel habits consist of movements 2-3 times per week.  On August 15, 2023 she developed 1 days worth of diarrhea.  Thereafter, no bowel movement for approximately 1 month.  She saw her PCP.  Lactulose  given.  This helped.  Converted to MiraLAX and Colace.  This did not help.  Subsequently given Amitiza .  Took for a while.  This helped.  Then stop.  Bowels now closer to baseline.  With change in bowel habits she had bloating, gas, and mucus.  Previous colonoscopy examinations were performed in 2019 and May 2021.  Exams are normal.  She is due for her follow-up exam in a few months.   Looking back in her chart, she went to the emergency room in June 2024 with right sided abdominal pain.  CT scan at that time revealed colitis involving the mid transverse through sigmoid region.   REVIEW OF SYSTEMS:   All non-GI ROS negative.     Past Medical History:  Diagnosis Date   Carotid stenosis, bilateral 10/07/2022   GERD (gastroesophageal reflux disease) 2018   Hypercholesteremia     Hyperthyroidism     Migraines      with asthma   Slipping rib syndrome     Well woman exam with routine gynecological exam 12/06/2022               Past Surgical History:  Procedure Laterality Date   BREAST BIOPSY Right 06/26/2014    benign per patient   BREAST BIOPSY Left 11/24/2022    MM LT BREAST BX W LOC DEV EA AD LESION IMG BX SPEC STEREO GUIDE 11/24/2022 [facility]   BREAST BIOPSY Left 11/24/2022    MM LT BREAST BX W LOC DEV 1ST LESION IMAGE BX SPEC STEREO GUIDE 11/24/2022 [facility]   COLONOSCOPY   08/03/2012    in [place]  Dr.Catalano-normal exam,melanosis   UPPER GASTROINTESTINAL ENDOSCOPY   09/23/2016          Social History [name]  reports that she has never smoked. She has never used smokeless tobacco. She reports current alcohol use. She reports that she does not use drugs.   family history includes Breast cancer in her cousin and cousin; Breast cancer (age of onset: 23) in her mother; Cancer in her father and mother; Colon cancer in her maternal aunt and maternal aunt; Colon cancer (age of onset: 60) in her mother; Diabetes in her mother; Hypertension in her mother; Prostate cancer in her father.   Allergies       Allergies  Allergen Reactions   Topamax  [Topiramate ]        Pain in legs, SOB, and almost passed out            PHYSICAL EXAMINATION: Vital signs: BP 104/70   Pulse 74   Ht 5' 7 (1.702 m)   Wt 161 lb (73 kg)   BMI 25.22 kg/m   Constitutional: generally well-appearing, no acute distress Psychiatric: alert and oriented x3, cooperative Eyes: extraocular movements intact, anicteric, conjunctiva pink Mouth: oral pharynx moist, no lesions Neck: supple no lymphadenopathy Cardiovascular: heart regular rate and rhythm, no murmur Lungs: clear  to auscultation bilaterally Abdomen: soft, nontender, nondistended, no obvious ascites, no peritoneal signs, normal bowel sounds, no organomegaly Rectal: Deferred until colonoscopy Extremities: no clubbing, cyanosis, or lower extremity edema bilaterally Skin: no lesions on visible extremities Neuro: No focal deficits.  Nerves intact   ASSESSMENT:   1.  Change in bowel habits as described.  Currently  better 2.  Family history of colon cancer 3.  Prior colonoscopy examinations 2019 and 2021 (complaints of rectal bleeding at that time) were negative for neoplasia. 4.  GERD complicated by peptic stricture.  Prior EGD with dilation August 2020.  No regular reflux medication 5.  Abnormal CT scan of the colon June 2024   PLAN:   1.   Okay to use Amitiza  on demand 2.  Schedule colonoscopy to evaluate change in bowel habits in a patient with prior abnormal CT scan of the colon and family history of colon cancer.The nature of the procedure, as well as the risks, benefits, and alternatives were carefully and thoroughly reviewed with the patient. Ample time for discussion and questions allowed. The patient understood, was satisfied, and agreed to proceed. 3.  Reflux precautions 4.  Acid suppressive medication on demand 5.  Ongoing general medical care with PCP.

## 2023-12-09 NOTE — Progress Notes (Signed)
 Pt sedate, gd SR's, VSS, report to RN

## 2023-12-09 NOTE — Op Note (Signed)
 [facility] Patient Name: Christina [name] Procedure Date: 12/09/2023 3:18 PM MRN: [id number] Endoscopist: Norleen SAILOR. Abran , MD, [id number] Age: 53 Referring MD:  Date of Birth: 01/09/1971 Gender: Female Account #: [id number] Procedure:                Colonoscopy Indications:              Abnormal CT of the GI tract, Change in bowel                            habits. Family history of colon cancer in her                            mother around age 75. Previous examinations 2014,                            19, and 20 all negative for neoplasia Medicines:                Monitored Anesthesia Care Procedure:                Pre-Anesthesia Assessment:                           - Prior to the procedure, a History and Physical                            was performed, and patient medications and                            allergies were reviewed. The patient's tolerance of                            previous anesthesia was also reviewed. The risks                            and benefits of the procedure and the sedation                            options and risks were discussed with the patient.                            All questions were answered, and informed consent                            was obtained. Prior Anticoagulants: The patient has                            taken no anticoagulant or antiplatelet agents. ASA                            Grade Assessment: II - A patient with mild systemic                            disease. After reviewing the risks and benefits,  the patient was deemed in satisfactory condition to                            undergo the procedure.                           After obtaining informed consent, the colonoscope                            was passed under direct vision. Throughout the                            procedure, the patient's blood pressure, pulse, and                            oxygen saturations were  monitored continuously. The                            Olympus CF-HQ190L ([id number]) Colonoscope was                            introduced through the anus and advanced to the the                            cecum, identified by appendiceal orifice and                            ileocecal valve. The ileocecal valve, appendiceal                            orifice, and rectum were photographed. The quality                            of the bowel preparation was excellent. The                            colonoscopy was performed without difficulty. The                            patient tolerated the procedure well. The bowel                            preparation used was SUPREP via split dose                            instruction. Scope In: 3:31:02 PM Scope Out: 3:46:17 PM Scope Withdrawal Time: 0 hours 9 minutes 19 seconds  Total Procedure Duration: 0 hours 15 minutes 15 seconds  Findings:                 The entire examined colon appeared normal on direct                            and retroflexion views. Complications:            No immediate  complications. Estimated blood loss:                            None. Estimated Blood Loss:     Estimated blood loss: none. Impression:               - The entire examined colon is normal on direct and                            retroflexion views.                           - No specimens collected. Recommendation:           - Repeat colonoscopy in 5 years for screening                            purposes.                           - Patient has a contact number available for                            emergencies. The signs and symptoms of potential                            delayed complications were discussed with the                            patient. Return to normal activities tomorrow.                            Written discharge instructions were provided to the                            patient.                           - Resume  previous diet.                           - Continue present medications. Norleen SAILOR. Abran, MD 12/09/2023 3:49:34 PM This report has been signed electronically.

## 2023-12-10 LAB — CYTOLOGY - PAP
Comment: NEGATIVE
Diagnosis: NEGATIVE
High risk HPV: NEGATIVE

## 2023-12-12 ENCOUNTER — Telehealth: Payer: Self-pay | Admitting: Lactation Services

## 2023-12-12 NOTE — Telephone Encounter (Signed)
  Follow up Call-     12/09/2023    2:17 PM  Call back number  Post procedure Call Back phone  # [phone]  Permission to leave phone message Yes     Patient questions:  Do you have a fever, pain , or abdominal swelling? No. Pain Score  0 *  Have you tolerated food without any problems? Yes.    Have you been able to return to your normal activities? Yes.    Do you have any questions about your discharge instructions: Diet   No. Medications  No. Follow up visit  No.  Do you have questions or concerns about your Care? No.  Actions: * If pain score is 4 or above: No action needed, pain <4.

## 2023-12-13 ENCOUNTER — Ambulatory Visit: Payer: Self-pay | Admitting: Obstetrics and Gynecology

## 2023-12-28 ENCOUNTER — Encounter: Payer: Self-pay | Admitting: Nurse Practitioner

## 2023-12-28 ENCOUNTER — Ambulatory Visit: Payer: Federal, State, Local not specified - PPO | Admitting: Nurse Practitioner

## 2023-12-28 VITALS — BP 112/76 | HR 73 | Temp 97.8°F | Ht 67.0 in | Wt 162.2 lb

## 2023-12-28 DIAGNOSIS — E7800 Pure hypercholesterolemia, unspecified: Secondary | ICD-10-CM

## 2023-12-28 DIAGNOSIS — R42 Dizziness and giddiness: Secondary | ICD-10-CM

## 2023-12-28 DIAGNOSIS — E0590 Thyrotoxicosis, unspecified without thyrotoxic crisis or storm: Secondary | ICD-10-CM | POA: Diagnosis not present

## 2023-12-28 DIAGNOSIS — K5900 Constipation, unspecified: Secondary | ICD-10-CM | POA: Diagnosis not present

## 2023-12-28 DIAGNOSIS — Z0001 Encounter for general adult medical examination with abnormal findings: Secondary | ICD-10-CM | POA: Diagnosis not present

## 2023-12-28 LAB — COMPREHENSIVE METABOLIC PANEL WITH GFR
ALT: 34 U/L (ref 0–35)
AST: 31 U/L (ref 0–37)
Albumin: 4.4 g/dL (ref 3.5–5.2)
Alkaline Phosphatase: 66 U/L (ref 39–117)
BUN: 15 mg/dL (ref 6–23)
CO2: 29 meq/L (ref 19–32)
Calcium: 9.2 mg/dL (ref 8.4–10.5)
Chloride: 105 meq/L (ref 96–112)
Creatinine, Ser: 0.97 mg/dL (ref 0.40–1.20)
GFR: 66.27 mL/min (ref >60.00)
Glucose, Bld: 95 mg/dL (ref 70–99)
Potassium: 4 meq/L (ref 3.5–5.1)
Sodium: 139 meq/L (ref 135–145)
Total Bilirubin: 0.6 mg/dL (ref 0.2–1.2)
Total Protein: 7.3 g/dL (ref 6.0–8.3)

## 2023-12-28 LAB — LIPID PANEL
Cholesterol: 167 mg/dL (ref 0–200)
HDL: 38.5 mg/dL — ABNORMAL LOW (ref >39.00)
LDL Cholesterol: 116 mg/dL — ABNORMAL HIGH (ref 0–99)
NonHDL: 128.01
Total CHOL/HDL Ratio: 4
Triglycerides: 58 mg/dL (ref 0.0–149.0)
VLDL: 11.6 mg/dL (ref 0.0–40.0)

## 2023-12-28 LAB — TSH: TSH: 4.05 u[IU]/mL (ref 0.35–5.50)

## 2023-12-28 MED ORDER — MECLIZINE HCL 25 MG PO TABS: 25.0000 mg | ORAL_TABLET | Freq: Every day | ORAL | 0 refills | Status: AC | PRN

## 2023-12-28 MED ORDER — LUBIPROSTONE 8 MCG PO CAPS: 8.0000 ug | ORAL_CAPSULE | Freq: Two times a day (BID) | ORAL | 5 refills | Status: AC

## 2023-12-28 NOTE — Assessment & Plan Note (Signed)
 Acute exacebation due to change in barometric pressure. Meclizine  sent Advised to f/up with neurologist

## 2023-12-28 NOTE — Patient Instructions (Signed)
 Call neurology about nurtec prescription. Go to lab Maintain Heart healthy diet and daily exercise. Maintain current medications.

## 2023-12-28 NOTE — Assessment & Plan Note (Signed)
 Repeat lipid panel ?

## 2023-12-28 NOTE — Progress Notes (Signed)
 Complete physical exam  Patient: Christina [name]   DOB: 1970-07-03   53 y.o. Female  MRN: [id number] Visit Date: 12/28/2023  Subjective:    Chief Complaint  Patient presents with   Annual Exam    FASTING  Refill requested for Meclizine     Christina [name] is a 53 y.o. female who presents today for a complete physical exam. She reports consuming a general diet. Walking 2-3x/week She generally feels well. She reports sleeping well. She does have additional problems to discuss today.  Vision:Yes Dental:Yes STD Screen:No  BP Readings from Last 3 Encounters:  12/28/23 112/76  12/09/23 107/75  12/07/23 100/60   Wt Readings from Last 3 Encounters:  12/28/23 162 lb 3.2 oz (73.6 kg)  12/09/23 163 lb (73.9 kg)  12/07/23 161 lb (73 kg)    Most recent fall risk assessment:    12/28/2023   10:42 AM  Fall Risk   Falls in the past year? 0  Number falls in past yr: 0  Injury with Fall? 0  Risk for fall due to : No Fall Risks  Follow up Falls evaluation completed     Depression screen:Yes - No Depression Most recent depression screenings:    12/28/2023   10:42 AM 12/07/2023   11:36 AM  PHQ 2/9 Scores  PHQ - 2 Score 0 0  PHQ- 9 Score 0     HPI  Hyperthyroidism Stable weight, no palpitation, no hair loss, no mood swing or diaphoresis. Repeat TSH  Vertigo Acute exacebation due to change in barometric pressure. Meclizine  sent Advised to f/up with neurologist  Hypercholesterolemia Repeat lipid panel  Constipation Improved with use of amitiza  8mcg daily   Past Medical History:  Diagnosis Date   Carotid stenosis, bilateral 10/07/2022   GERD (gastroesophageal reflux disease) 2018   Hypercholesteremia    Hyperthyroidism    Migraines    with asthma   Slipping rib syndrome    Well woman exam with routine gynecological exam 12/06/2022   Past Surgical History:  Procedure Laterality Date   BREAST BIOPSY Right 06/26/2014   benign per patient   BREAST BIOPSY Left  11/24/2022   MM LT BREAST BX W LOC DEV EA AD LESION IMG BX SPEC STEREO GUIDE 11/24/2022 [facility]   BREAST BIOPSY Left 11/24/2022   MM LT BREAST BX W LOC DEV 1ST LESION IMAGE BX SPEC STEREO GUIDE 11/24/2022 [facility]   COLONOSCOPY  08/03/2012   in [place] Dr.Catalano-normal exam,melanosis   UPPER GASTROINTESTINAL ENDOSCOPY  09/23/2016   Social History   Socioeconomic History   Marital status: Married    Spouse name: Not on file   Number of children: 1   Years of education: 14   Highest education level: Some college, no degree  Occupational History   Occupation: Programme Researcher, Broadcasting/film/video  Tobacco Use   Smoking status: Never   Smokeless tobacco: Never  Vaping Use   Vaping status: Never Used  Substance and Sexual Activity   Alcohol use: Not Currently   Drug use: No   Sexual activity: Not Currently    Partners: Male    Birth control/protection: None  Other Topics Concern   Not on file  Social History Narrative   Born and raised in [place] .    Currently resides in a house with her child. No pets. Fun: Go to the movies.    Denies religious beliefs effecting health care.    Social Drivers of Health   Financial Resource Strain: Low Risk  (09/28/2023)  Overall Financial Resource Strain (CARDIA)    Difficulty of Paying Living Expenses: Not very hard  Food Insecurity: No Food Insecurity (09/28/2023)   Hunger Vital Sign    Worried About Running Out of Food in the Last Year: Never true    Ran Out of Food in the Last Year: Never true  Transportation Needs: No Transportation Needs (09/28/2023)   PRAPARE - Administrator, Civil Service (Medical): No    Lack of Transportation (Non-Medical): No  Physical Activity: Insufficiently Active (12/27/2023)   Exercise Vital Sign    Days of Exercise per Week: 2 days    Minutes of Exercise per Session: 10 min  Stress: No Stress Concern Present (09/28/2023)   [employer] of Occupational Health - Occupational Stress Questionnaire     Feeling of Stress: Not at all  Social Connections: Unknown (09/28/2023)   Social Connection and Isolation Panel    Frequency of Communication with Friends and Family: Patient declined    Frequency of Social Gatherings with Friends and Family: Patient declined    Attends Religious Services: Patient declined    Database Administrator or Organizations: Patient declined    Attends Banker Meetings: Not on file    Marital Status: Married  Intimate Partner Violence: Not At Risk (05/22/2023)   Received from [facility]   HITS    Over the last 12 months how often did your partner physically hurt you?: Never    Over the last 12 months how often did your partner insult you or talk down to you?: Never    Over the last 12 months how often did your partner threaten you with physical harm?: Never    Over the last 12 months how often did your partner scream or curse at you?: Never   Family Status  Relation Name Status   Mother deceased Deceased at age 17   Father deceased Deceased   Mat Aunt  (Not Specified)   Mat Aunt  (Not Specified)   Cousin  (Not Specified)   Cousin  (Not Specified)   Neg Hx  (Not Specified)  No partnership data on file   Family History  Problem Relation Age of Onset   Diabetes Mother    Hypertension Mother    Colon cancer Mother 67   Breast cancer Mother 50   Cancer Mother    Prostate cancer Father    Cancer Father    Colon cancer Maternal Aunt    Colon cancer Maternal Aunt    Breast cancer Cousin    Breast cancer Cousin    Stomach cancer Neg Hx    Esophageal cancer Neg Hx    Colon polyps Neg Hx    Rectal cancer Neg Hx    Allergies  Allergen Reactions   Topamax  [Topiramate ] Other (See Comments)    Pain in legs, SOB, and almost passed out    Patient Care Team: Gracin Soohoo, [name], NP as PCP - General (Internal Medicine)   Medications: Outpatient Medications Prior to Visit  Medication Sig   Calcium-Magnesium-Vitamin D  600-40-500 MG-MG-UNIT  TB24    clotrimazole-betamethasone (LOTRISONE) cream Apply topically.   Multiple Vitamin tablet Take by mouth.   tiZANidine  (ZANAFLEX ) 4 MG tablet Take 1 tablet (4 mg total) by mouth at bedtime.   VITAMIN D  PO Take 1 tablet by mouth daily.   [DISCONTINUED] lubiprostone  (AMITIZA ) 8 MCG capsule Take 1 capsule (8 mcg total) by mouth 2 (two) times daily with a meal.   [DISCONTINUED]  meclizine  (ANTIVERT ) 25 MG tablet Take 1 tablet (25 mg total) by mouth 3 (three) times daily as needed for dizziness.   Rimegepant Sulfate (NURTEC) 75 MG TBDP Take 75 mg by mouth. (Patient not taking: Reported on 12/28/2023)   No facility-administered medications prior to visit.   Review of Systems  Constitutional:  Negative for activity change, appetite change and unexpected weight change.  Respiratory: Negative.    Cardiovascular: Negative.   Gastrointestinal: Negative.   Endocrine: Negative for cold intolerance and heat intolerance.  Genitourinary: Negative.   Musculoskeletal: Negative.   Skin: Negative.   Neurological: Negative.   Hematological: Negative.   Psychiatric/Behavioral:  Negative for behavioral problems, decreased concentration, dysphoric mood, hallucinations, self-injury, sleep disturbance and suicidal ideas. The patient is not nervous/anxious.        Objective:  BP 112/76 (BP Location: Left Arm, Patient Position: Sitting, Cuff Size: Normal)   Pulse 73   Temp 97.8 F (36.6 C) (Oral)   Ht 5' 7 (1.702 m)   Wt 162 lb 3.2 oz (73.6 kg)   LMP 11/26/2022 (Approximate)   SpO2 96%   BMI 25.40 kg/m     Physical Exam Vitals and nursing note reviewed.  Constitutional:      General: She is not in acute distress. HENT:     Right Ear: Tympanic membrane, ear canal and external ear normal.     Left Ear: Tympanic membrane, ear canal and external ear normal.     Nose: Nose normal.  Eyes:     Extraocular Movements: Extraocular movements intact.     Conjunctiva/sclera: Conjunctivae normal.      Pupils: Pupils are equal, round, and reactive to light.  Neck:     Thyroid : No thyroid  mass, thyromegaly or thyroid  tenderness.  Cardiovascular:     Rate and Rhythm: Normal rate and regular rhythm.     Pulses: Normal pulses.     Heart sounds: Normal heart sounds.  Pulmonary:     Effort: Pulmonary effort is normal.     Breath sounds: Normal breath sounds.  Abdominal:     General: Bowel sounds are normal.     Palpations: Abdomen is soft.  Musculoskeletal:        General: Normal range of motion.     Cervical back: Normal range of motion and neck supple.     Right lower leg: No edema.     Left lower leg: No edema.  Lymphadenopathy:     Cervical: No cervical adenopathy.  Skin:    General: Skin is warm and dry.  Neurological:     Mental Status: She is alert and oriented to person, place, and time.     Cranial Nerves: No cranial nerve deficit.  Psychiatric:        Mood and Affect: Mood normal.        Behavior: Behavior normal.        Thought Content: Thought content normal.      Results for orders placed or performed in visit on 12/28/23  Lipid panel  Result Value Ref Range   Cholesterol 167 0 - 200 mg/dL   Triglycerides 41.9 0.0 - 149.0 mg/dL   HDL 61.49 (L) >60.99 mg/dL   VLDL 88.3 0.0 - 59.9 mg/dL   LDL Cholesterol 883 (H) 0 - 99 mg/dL   Total CHOL/HDL Ratio 4    NonHDL 128.01   Comprehensive metabolic panel with GFR  Result Value Ref Range   Sodium 139 135 - 145 mEq/L   Potassium 4.0 3.5 -  5.1 mEq/L   Chloride 105 96 - 112 mEq/L   CO2 29 19 - 32 mEq/L   Glucose, Bld 95 70 - 99 mg/dL   BUN 15 6 - 23 mg/dL   Creatinine, Ser 9.02 0.40 - 1.20 mg/dL   Total Bilirubin 0.6 0.2 - 1.2 mg/dL   Alkaline Phosphatase 66 39 - 117 U/L   AST 31 0 - 37 U/L   ALT 34 0 - 35 U/L   Total Protein 7.3 6.0 - 8.3 g/dL   Albumin 4.4 3.5 - 5.2 g/dL   GFR 33.72 >39.99 mL/min   Calcium 9.2 8.4 - 10.5 mg/dL  TSH  Result Value Ref Range   TSH 4.05 0.35 - 5.50 uIU/mL      Assessment &  Plan:    Routine Health Maintenance and Physical Exam  Immunization History  Administered Date(s) Administered   PFIZER(Purple Top)SARS-COV-2 Vaccination 05/02/2019   Tdap 05/27/2014    Health Maintenance  Topic Date Due   COVID-19 Vaccine (2 - Pfizer risk series) 01/25/2024 (Originally 05/23/2019)   Zoster Vaccines- Shingrix (1 of 2) 03/27/2024 (Originally 07/10/1988)   Influenza Vaccine  04/24/2024 (Originally 08/26/2023)   Pneumococcal Vaccine: 50+ Years (1 of 1 - PCV) 12/27/2024 (Originally 07/11/2019)   Hepatitis B Vaccines 19-59 Average Risk (1 of 3 - 19+ 3-dose series) 12/27/2024 (Originally 07/10/1988)   Hepatitis C Screening  12/27/2024 (Originally 07/11/1987)   HIV Screening  12/27/2024 (Originally 07/10/1984)   DTaP/Tdap/Td (2 - Td or Tdap) 05/26/2024   Mammogram  11/13/2024   Cervical Cancer Screening (HPV/Pap Cotest)  12/06/2028   Colonoscopy  12/08/2028   HPV VACCINES  Aged Out   Meningococcal B Vaccine  Aged Out   Discussed health benefits of physical activity, and encouraged her to engage in regular exercise appropriate for her age and condition.  Problem List Items Addressed This Visit     Constipation   Improved with use of amitiza  8mcg daily      Relevant Medications   lubiprostone  (AMITIZA ) 8 MCG capsule   Hypercholesterolemia   Repeat lipid panel      Relevant Orders   Lipid panel (Completed)   Hyperthyroidism   Stable weight, no palpitation, no hair loss, no mood swing or diaphoresis. Repeat TSH      Relevant Orders   TSH (Completed)   Vertigo   Acute exacebation due to change in barometric pressure. Meclizine  sent Advised to f/up with neurologist      Relevant Medications   meclizine  (ANTIVERT ) 25 MG tablet   Other Visit Diagnoses       Encounter for preventative adult health care exam with abnormal findings    -  Primary   Relevant Orders   Comprehensive metabolic panel with GFR (Completed)      Return in about 6 months (around  06/27/2024) for hyperlipidemia (fasting).     Roselie Mood, NP

## 2023-12-28 NOTE — Assessment & Plan Note (Signed)
 Stable weight, no palpitation, no hair loss, no mood swing or diaphoresis. Repeat TSH

## 2023-12-28 NOTE — Assessment & Plan Note (Signed)
 Improved with use of amitiza  8mcg daily

## 2023-12-30 ENCOUNTER — Ambulatory Visit: Payer: Self-pay | Admitting: Nurse Practitioner

## 2024-02-09 NOTE — Progress Notes (Signed)
 " [name] [address] [place] [address] Phone: [phone] Subjective:   Christina [name], am serving as a scribe for Dr. Arthea Claudene.  I'[name] seeing this patient by the request  of:  [name], [name], NP  CC: back and neck pain   YEP:Dlagzrupcz  Christina [name] is a 54 y.o. female coming in with complaint of back and neck pain. OMT 11/14/2023. Patient states that her neck has been painful. Pain in middle of spine and she has trouble turning her neck. Lower back has been doing ok.   Medications patient has been prescribed: None  Taking:         Reviewed prior external information including notes and imaging from previsou exam, outside providers and external EMR if available.   As well as notes that were available from care everywhere and other healthcare systems.  Past medical history, social, surgical and family history all reviewed in electronic medical record.  No pertanent information unless stated regarding to the chief complaint.   Past Medical History:  Diagnosis Date   Carotid stenosis, bilateral 10/07/2022   GERD (gastroesophageal reflux disease) 2018   Hypercholesteremia    Hyperthyroidism    Migraines    with asthma   Slipping rib syndrome    Well woman exam with routine gynecological exam 12/06/2022    Allergies[1]   Review of Systems:  No headache, visual changes, nausea, vomiting, diarrhea, constipation, dizziness, abdominal pain, skin rash, fevers, chills, night sweats, weight loss, swollen lymph nodes, body aches, joint swelling, chest pain, shortness of breath, mood changes. POSITIVE muscle aches  Objective  Blood pressure 118/78, pulse 78, height 5' 7 (1.702 [name]), weight 164 lb (74.4 kg), last menstrual period 11/26/2022, SpO2 98%.   General: No apparent distress alert and oriented x3 mood and affect normal, dressed appropriately.  HEENT: Pupils equal, extraocular movements intact  Respiratory: Patient's  speak in full sentences and does not appear short of breath  Cardiovascular: No lower extremity edema, non tender, no erythema  Gait MSK:  Back does have some loss of lordosis noted.  Seems to be tighter over the sacroiliac joint on the right greater than left.  Osteopathic findings  C2 flexed rotated and side bent right C6 flexed rotated and side bent left T3 extended rotated and side bent right inhaled rib T9 extended rotated and side bent left L2 flexed rotated and side bent right L4 flexed rotated and side bent left Sacrum right on right    Assessment and Plan:  Left lumbar radiculopathy Patient likely is not having any radicular symptoms and seems to be more secondary to the sacroiliac joint.  Some tightness noted noted in the lower back.  Discussed icing regimen and home exercises, increase activity slowly.  Follow-up again in 6 to 12 weeks no change in medications.    Nonallopathic problems  Decision today to treat with OMT was based on Physical Exam  After verbal consent patient was treated with HVLA, ME, FPR techniques in cervical, rib, thoracic, lumbar, and sacral  areas  Patient tolerated the procedure well with improvement in symptoms  Patient given exercises, stretches and lifestyle modifications  See medications in patient instructions if given  Patient will follow up in 4-8 weeks    The above documentation has been reviewed and is accurate and complete Christina [name] Christina Pack, DO          Note: This dictation was prepared with Dragon dictation along with smaller phrase technology.  Any transcriptional errors that result from this process are unintentional.            [1]  Allergies Allergen Reactions   Topamax  [Topiramate ] Other (See Comments)    Pain in legs, SOB, and almost passed out   "

## 2024-02-14 ENCOUNTER — Encounter: Payer: Self-pay | Admitting: Family Medicine

## 2024-02-14 ENCOUNTER — Ambulatory Visit: Admitting: Family Medicine

## 2024-02-14 VITALS — BP 118/78 | HR 78 | Ht 67.0 in | Wt 164.0 lb

## 2024-02-14 DIAGNOSIS — M9908 Segmental and somatic dysfunction of rib cage: Secondary | ICD-10-CM | POA: Diagnosis not present

## 2024-02-14 DIAGNOSIS — M5416 Radiculopathy, lumbar region: Secondary | ICD-10-CM | POA: Diagnosis not present

## 2024-02-14 DIAGNOSIS — M9902 Segmental and somatic dysfunction of thoracic region: Secondary | ICD-10-CM | POA: Diagnosis not present

## 2024-02-14 DIAGNOSIS — M9904 Segmental and somatic dysfunction of sacral region: Secondary | ICD-10-CM

## 2024-02-14 DIAGNOSIS — M9901 Segmental and somatic dysfunction of cervical region: Secondary | ICD-10-CM

## 2024-02-14 DIAGNOSIS — M9903 Segmental and somatic dysfunction of lumbar region: Secondary | ICD-10-CM

## 2024-02-14 NOTE — Patient Instructions (Signed)
 Upright Go necklace Keep working on posture See me in 7-8 weeks

## 2024-02-14 NOTE — Assessment & Plan Note (Signed)
 Patient likely is not having any radicular symptoms and seems to be more secondary to the sacroiliac joint.  Some tightness noted noted in the lower back.  Discussed icing regimen and home exercises, increase activity slowly.  Follow-up again in 6 to 12 weeks no change in medications.

## 2024-04-03 ENCOUNTER — Ambulatory Visit: Admitting: Family Medicine

## 2024-05-10 ENCOUNTER — Other Ambulatory Visit

## 2024-06-27 ENCOUNTER — Ambulatory Visit: Admitting: Nurse Practitioner
